# Patient Record
Sex: Female | Born: 1955
Health system: Southern US, Community
[De-identification: ages and names within clinical notes are randomized; demographics above are authoritative.]

## PROBLEM LIST (undated history)

## (undated) DIAGNOSIS — Z8619 Personal history of other infectious and parasitic diseases: Secondary | ICD-10-CM

## (undated) DIAGNOSIS — H409 Unspecified glaucoma: Secondary | ICD-10-CM

## (undated) DIAGNOSIS — K649 Unspecified hemorrhoids: Secondary | ICD-10-CM

## (undated) DIAGNOSIS — E162 Hypoglycemia, unspecified: Secondary | ICD-10-CM

## (undated) DIAGNOSIS — E079 Disorder of thyroid, unspecified: Secondary | ICD-10-CM

## (undated) DIAGNOSIS — T7840XA Allergy, unspecified, initial encounter: Secondary | ICD-10-CM

## (undated) DIAGNOSIS — F1910 Other psychoactive substance abuse, uncomplicated: Secondary | ICD-10-CM

## (undated) DIAGNOSIS — R011 Cardiac murmur, unspecified: Secondary | ICD-10-CM

## (undated) DIAGNOSIS — R296 Repeated falls: Secondary | ICD-10-CM

## (undated) DIAGNOSIS — M1990 Unspecified osteoarthritis, unspecified site: Secondary | ICD-10-CM

## (undated) DIAGNOSIS — U071 COVID-19: Secondary | ICD-10-CM

## (undated) DIAGNOSIS — E785 Hyperlipidemia, unspecified: Secondary | ICD-10-CM

## (undated) HISTORY — DX: COVID-19: U07.1

## (undated) HISTORY — PX: DENTAL SURGERY: SHX609

## (undated) HISTORY — DX: Hypoglycemia, unspecified: E16.2

## (undated) HISTORY — DX: Unspecified osteoarthritis, unspecified site: M19.90

## (undated) HISTORY — PX: COLONOSCOPY: SHX174

## (undated) HISTORY — PX: BUNIONECTOMY: SHX129

## (undated) HISTORY — PX: POLYPECTOMY: SHX149

## (undated) HISTORY — DX: Repeated falls: R29.6

## (undated) HISTORY — DX: Personal history of other infectious and parasitic diseases: Z86.19

## (undated) HISTORY — DX: Allergy, unspecified, initial encounter: T78.40XA

## (undated) HISTORY — PX: ABDOMINAL HYSTERECTOMY: SHX81

## (undated) HISTORY — DX: Cardiac murmur, unspecified: R01.1

## (undated) HISTORY — DX: Unspecified hemorrhoids: K64.9

## (undated) HISTORY — DX: Hyperlipidemia, unspecified: E78.5

## (undated) HISTORY — PX: EYE SURGERY: SHX253

## (undated) HISTORY — DX: Unspecified glaucoma: H40.9

## (undated) HISTORY — PX: TUBAL LIGATION: SHX77

## (undated) HISTORY — DX: Other psychoactive substance abuse, uncomplicated: F19.10

---
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
## 1981-06-16 HISTORY — PX: BREAST CYST EXCISION: SHX579

## 1998-08-02 ENCOUNTER — Other Ambulatory Visit: Admission: RE | Admit: 1998-08-02 | Discharge: 1998-08-02 | Payer: Self-pay | Admitting: Family Medicine

## 1998-12-31 ENCOUNTER — Other Ambulatory Visit: Admission: RE | Admit: 1998-12-31 | Discharge: 1998-12-31 | Payer: Self-pay | Admitting: *Deleted

## 2000-10-02 ENCOUNTER — Other Ambulatory Visit: Admission: RE | Admit: 2000-10-02 | Discharge: 2000-10-02 | Payer: Self-pay | Admitting: Family Medicine

## 2001-10-04 ENCOUNTER — Other Ambulatory Visit: Admission: RE | Admit: 2001-10-04 | Discharge: 2001-10-04 | Payer: Self-pay | Admitting: Family Medicine

## 2002-10-18 ENCOUNTER — Other Ambulatory Visit: Admission: RE | Admit: 2002-10-18 | Discharge: 2002-10-18 | Payer: Self-pay | Admitting: Family Medicine

## 2002-11-08 ENCOUNTER — Encounter: Admission: RE | Admit: 2002-11-08 | Discharge: 2002-11-08 | Payer: Self-pay | Admitting: Family Medicine

## 2002-11-08 ENCOUNTER — Encounter: Payer: Self-pay | Admitting: Family Medicine

## 2006-07-29 ENCOUNTER — Ambulatory Visit: Payer: Self-pay | Admitting: Internal Medicine

## 2006-07-29 LAB — CONVERTED CEMR LAB
ALT: 31 units/L (ref 0–40)
Albumin: 4.1 g/dL (ref 3.5–5.2)
Alkaline Phosphatase: 55 units/L (ref 39–117)
BUN: 14 mg/dL (ref 6–23)
Basophils Relative: 0.6 % (ref 0.0–1.0)
CO2: 31 meq/L (ref 19–32)
Calcium: 6.9 mg/dL — ABNORMAL LOW (ref 8.4–10.5)
Cholesterol: 274 mg/dL (ref 0–200)
Creatinine, Ser: 0.9 mg/dL (ref 0.4–1.2)
HDL: 69.4 mg/dL (ref >39.0)
Monocytes Relative: 11.2 % — ABNORMAL HIGH (ref 3.0–11.0)
Platelets: 321 10*3/uL (ref 150–400)
RBC: 4.49 M/uL (ref 3.87–5.11)
RDW: 13.4 % (ref 11.5–14.6)
Total Bilirubin: 1.4 mg/dL — ABNORMAL HIGH (ref 0.3–1.2)
Total CHOL/HDL Ratio: 3.9
Total Protein: 8.3 g/dL (ref 6.0–8.3)
Triglycerides: 93 mg/dL (ref 0–149)
VLDL: 19 mg/dL (ref 0–40)

## 2006-08-05 ENCOUNTER — Other Ambulatory Visit: Admission: RE | Admit: 2006-08-05 | Discharge: 2006-08-05 | Payer: Self-pay | Admitting: Internal Medicine

## 2006-08-05 ENCOUNTER — Ambulatory Visit: Payer: Self-pay | Admitting: Internal Medicine

## 2006-08-05 ENCOUNTER — Encounter: Payer: Self-pay | Admitting: Internal Medicine

## 2006-08-05 LAB — HM PAP SMEAR: HM PAP: NEGATIVE

## 2006-08-06 ENCOUNTER — Ambulatory Visit: Payer: Self-pay | Admitting: Internal Medicine

## 2006-08-06 LAB — CONVERTED CEMR LAB
Calcium, Total (PTH): 6.5 mg/dL — ABNORMAL LOW (ref 8.4–10.5)
PTH: 2.5 pg/mL — ABNORMAL LOW (ref 14.0–72.0)
Total CK: 168 units/L (ref 7–177)

## 2006-08-27 ENCOUNTER — Ambulatory Visit: Payer: Self-pay | Admitting: Endocrinology

## 2006-10-13 ENCOUNTER — Ambulatory Visit: Payer: Self-pay | Admitting: Internal Medicine

## 2006-10-13 LAB — CONVERTED CEMR LAB
Cholesterol: 224 mg/dL (ref 0–200)
Direct LDL: 111.7 mg/dL
Total CHOL/HDL Ratio: 2.5

## 2006-11-17 ENCOUNTER — Ambulatory Visit: Payer: Self-pay | Admitting: Endocrinology

## 2006-12-03 ENCOUNTER — Ambulatory Visit: Payer: Self-pay | Admitting: Endocrinology

## 2006-12-03 LAB — CONVERTED CEMR LAB: Calcium, Total (PTH): 8.2 mg/dL — ABNORMAL LOW (ref 8.4–10.5)

## 2006-12-08 ENCOUNTER — Encounter: Payer: Self-pay | Admitting: Internal Medicine

## 2007-01-19 ENCOUNTER — Telehealth: Payer: Self-pay | Admitting: Internal Medicine

## 2007-01-20 ENCOUNTER — Encounter: Payer: Self-pay | Admitting: Endocrinology

## 2007-01-21 ENCOUNTER — Ambulatory Visit: Payer: Self-pay | Admitting: Endocrinology

## 2007-01-21 LAB — CONVERTED CEMR LAB
BUN: 11 mg/dL (ref 6–23)
CO2: 29 meq/L (ref 19–32)
Calcium, Total (PTH): 7.7 mg/dL — ABNORMAL LOW (ref 8.4–10.5)
Calcium: 7.9 mg/dL — ABNORMAL LOW (ref 8.4–10.5)
GFR calc Af Amer: 97 mL/min
GFR calc non Af Amer: 80 mL/min
Potassium: 3.9 meq/L (ref 3.5–5.1)
TSH: 0.31 microintl units/mL — ABNORMAL LOW (ref 0.35–5.50)

## 2007-01-25 ENCOUNTER — Ambulatory Visit: Payer: Self-pay

## 2007-02-22 ENCOUNTER — Encounter: Payer: Self-pay | Admitting: Endocrinology

## 2007-02-23 ENCOUNTER — Ambulatory Visit: Payer: Self-pay | Admitting: Endocrinology

## 2007-02-23 LAB — CONVERTED CEMR LAB
Sed Rate: 14 mm/h
TSH: 1.89 u[IU]/mL

## 2007-02-26 ENCOUNTER — Encounter: Payer: Self-pay | Admitting: Endocrinology

## 2008-01-11 ENCOUNTER — Telehealth: Payer: Self-pay | Admitting: Internal Medicine

## 2008-02-09 ENCOUNTER — Telehealth (INDEPENDENT_AMBULATORY_CARE_PROVIDER_SITE_OTHER): Payer: Self-pay | Admitting: *Deleted

## 2008-02-17 ENCOUNTER — Encounter: Payer: Self-pay | Admitting: Internal Medicine

## 2008-02-23 ENCOUNTER — Telehealth: Payer: Self-pay | Admitting: Internal Medicine

## 2008-03-17 ENCOUNTER — Encounter: Payer: Self-pay | Admitting: Endocrinology

## 2008-03-17 ENCOUNTER — Telehealth: Payer: Self-pay | Admitting: Internal Medicine

## 2008-03-17 ENCOUNTER — Telehealth: Payer: Self-pay | Admitting: *Deleted

## 2008-03-21 ENCOUNTER — Telehealth (INDEPENDENT_AMBULATORY_CARE_PROVIDER_SITE_OTHER): Payer: Self-pay | Admitting: *Deleted

## 2008-09-06 LAB — HM MAMMOGRAPHY

## 2009-07-09 ENCOUNTER — Encounter: Payer: Self-pay | Admitting: Cardiovascular Disease

## 2010-02-07 ENCOUNTER — Encounter: Payer: Self-pay | Admitting: Cardiology

## 2010-03-14 ENCOUNTER — Ambulatory Visit: Payer: Self-pay | Admitting: Endocrinology

## 2010-03-14 DIAGNOSIS — E209 Hypoparathyroidism, unspecified: Secondary | ICD-10-CM | POA: Insufficient documentation

## 2010-05-03 ENCOUNTER — Encounter: Payer: Self-pay | Admitting: Cardiology

## 2010-05-14 DIAGNOSIS — E785 Hyperlipidemia, unspecified: Secondary | ICD-10-CM | POA: Insufficient documentation

## 2010-05-14 DIAGNOSIS — E049 Nontoxic goiter, unspecified: Secondary | ICD-10-CM | POA: Insufficient documentation

## 2010-05-15 ENCOUNTER — Encounter: Payer: Self-pay | Admitting: Cardiology

## 2010-05-15 ENCOUNTER — Ambulatory Visit: Payer: Self-pay | Admitting: Cardiology

## 2010-05-15 DIAGNOSIS — R072 Precordial pain: Secondary | ICD-10-CM | POA: Insufficient documentation

## 2010-05-15 DIAGNOSIS — F17200 Nicotine dependence, unspecified, uncomplicated: Secondary | ICD-10-CM | POA: Insufficient documentation

## 2010-05-20 ENCOUNTER — Telehealth (INDEPENDENT_AMBULATORY_CARE_PROVIDER_SITE_OTHER): Payer: Self-pay | Admitting: *Deleted

## 2010-05-21 ENCOUNTER — Ambulatory Visit: Payer: Self-pay

## 2010-05-21 ENCOUNTER — Ambulatory Visit (HOSPITAL_COMMUNITY)
Admission: RE | Admit: 2010-05-21 | Discharge: 2010-05-21 | Disposition: A | Discharge status: Still In Hospital | Payer: Self-pay | Source: Home / Self Care | Attending: Cardiology | Admitting: Cardiology

## 2010-05-23 ENCOUNTER — Emergency Department (HOSPITAL_COMMUNITY): Admission: EM | Admit: 2010-05-23 | Discharge: 2009-07-09 | Payer: Self-pay | Admitting: Emergency Medicine

## 2010-07-18 NOTE — Letter (Signed)
Summary: Progress Note  Progress Note   Imported By: Roderic Ovens 06/03/2010 10:56:01  _____________________________________________________________________  External Attachment:    Type:   Image     Comment:   External Document

## 2010-07-18 NOTE — Assessment & Plan Note (Signed)
Summary: NP6/ DX: ATYPICAL CHESTPAIN/.INSU: [employer]. GD  Medications Added * CALCIUM 1 tab by mouth once daily MULTIVITAMINS   TABS (MULTIPLE VITAMIN) 1 tab bpo once daily        Primary Provider:  panosh  CC:  sob...pt states she experinced extreame arm numbness.  History of Present Illness: 55 yo female with no prior cardiac history who I am asked to evaluate for chest pain. Several weeks ago the patient had left shoulder pain. She states my shoulder "froze". It was difficult to move the arm. She had an episode in class of nausea, diaphoresis and dizziness. She also states that when she exerts herself to an extreme amount she will develop a heaviness in her chest. It resolved with rest. She has had this since an early age. It is unchanged. She has some dyspnea on exertion that she attributes to smoking. There is no orthopnea, PND, pedal edema or palpitations. Because of the above we were asked to further evaluate.  Current Medications (verified): 1)  Levothyroxine Sodium 75 Mcg  Tabs (Levothyroxine Sodium) .... Take 1 By Mouth Qd 2)  Calcitriol 0.5 Mcg  Caps (Calcitriol) .... Take 1 By Mouth Qd 3)  Calcium .Marland Kitchen.. 1 Tab By Mouth Once Daily 4)  Multivitamins   Tabs (Multiple Vitamin) .Marland Kitchen.. 1 Tab Bpo Once Daily  Allergies: 1)  ! Penicillin  Past History:  Past Medical History: Reviewed history from 05/14/2010 and no changes required. HYPERLIPIDEMIA  GOITER HYPOCALCEMIA  HYPOTHYROIDISM  Hypo-parathyroidism (2008) Menopause  Past Surgical History: Reviewed history from 05/14/2010 and no changes required. Thyroidectomy (1970's)  In 1987, a lumpectomy for benign breast problems.  In 1996, partial hysterectomy for possible fibroids.   January 2007, a bunion in the foot and apparently needed plates in her right foot.   November 2007, dental surgery with implants.  Family History: Mother died in her 76s with some type of liver hardening.  Was not a drinker.   Question if it could have been biliary cirrhosis but no diagnosis was ever made.  There are no other family members with liver disease.  Elevated cholesterol in her mom and her. Type 2 diabetes in both sides of the family.  Unknown if thyroid disease has been an issue. No premature CAD  Social History: Reviewed history from 05/14/2010 and no changes required. Household of one.  Works as a Building control surveyor. Divorced.  Five to seven hours of sleep.  Tobacco for 20 years.  Social alcohol, not a lot.  Exercise occasional but not much.  See database.  Review of Systems       Problems with fatigue but no fevers or chills, productive cough, hemoptysis, dysphasia, odynophagia, melena, hematochezia, dysuria, hematuria, rash, seizure activity, orthopnea, PND, pedal edema, claudication. Remaining systems are negative.   Vital Signs:  Patient profile:   55 year old female Height:      68 inches Weight:      157 pounds BMI:     23.96 Pulse rate:   77 / minute Resp:     14 per minute BP sitting:   111 / 71  (left arm)  Vitals Entered By: Kem Parkinson (May 15, 2010 2:57 PM)  Physical Exam  General:  Well developed/well nourished in NAD Skin warm/dry Patient not depressed No peripheral clubbing Back-normal HEENT-normal/normal eyelids Neck supple/normal carotid upstroke bilaterally; no bruits; no JVD; no thyromegaly chest - CTA/ normal expansion CV - RRR/normal S1 and S2; no  rubs or gallops;  PMI nondisplaced;  1/6 systolic ejection murmur. Abdomen -NT/ND, no HSM, no mass, + bowel sounds, no bruit 2+ femoral pulses, no bruits Ext-no edema, chords, 2+ DP Neuro-grossly nonfocal     EKG  Procedure date:  05/15/2010  Findings:      Sinus rhythm, left anterior fascicular block, cannot rule out prior septal infarct, nonspecific ST changes.  Impression & Recommendations:  Problem # 1:  CHEST PAIN, PRECORDIAL (ICD-786.51) Atypical chest pain. Schedule stress  echocardiogram. Orders: Stress Echo (Stress Echo)  Problem # 2:  HYPERLIPIDEMIA (ICD-272.4) Continue statin. Lipids and liver monitored by primary care. The following medications were removed from the medication list:    Simvastatin 20 Mg Tabs (Simvastatin) .Marland Kitchen... 1 by mouth once daily  Problem # 3:  HYPOTHYROIDISM (ICD-244.9)  Her updated medication list for this problem includes:    Levothyroxine Sodium 75 Mcg Tabs (Levothyroxine sodium) .Marland Kitchen... Take 1 by mouth qd  Problem # 4:  TOBACCO ABUSE (ICD-305.1) Patient counseled on discontinuing for between 3-10 minutes.  Patient Instructions: 1)  Your physician recommends that you schedule a follow-up appointment in: AS NEEDED PENDING TEST RESULTS 2)  Your physician has requested that you have a stress echocardiogram. For further information please visit [email or web address]  Please follow instruction sheet as given.

## 2010-07-18 NOTE — Letter (Signed)
Summary: [facility] Physician Documentation Sheet  [facility] Physician Documentation Sheet   Imported By: Earl Many 05/14/2010 16:01:07  _____________________________________________________________________  External Attachment:    Type:   Image     Comment:   External Document

## 2010-07-18 NOTE — Letter (Signed)
Summary: Release of info/[employer]  Release of info/[employer]   Imported By: Sherian Rein 03/18/2010 14:15:08  _____________________________________________________________________  External Attachment:    Type:   Image     Comment:   External Document

## 2010-07-18 NOTE — Progress Notes (Signed)
Summary: Stress Echo pre procedure  Phone Note Outgoing Call Call back at Home Phone [phone]   Call placed by: Rea College, CMA,  May 20, 2010 1:32 PM Call placed to: Patient Summary of Call: Left message with instructions for Stress Echo on 05/21/10 @ 3:00 p.m.

## 2010-07-18 NOTE — Miscellaneous (Signed)
   Clinical Lists Changes  Problems: Added new problem of HYPOPARATHYROIDISM (ICD-252.1)

## 2010-11-01 NOTE — Assessment & Plan Note (Signed)
[facility]                            [facility] OFFICE NOTE   Tricia Woodward, Tricia Woodward                       MRN:          [id number]  DATE:08/05/2006                            DOB:          1956/06/11    CHIEF COMPLAINT:  New patient physical, wants Pap, discuss Wellbutrin,  lipids, multiple issues.   HISTORY OF PRESENT ILLNESS:  Ms. Chismar is a 55 year old occasional  closet-smoker divorced African-American female who comes today for a  first-time visit.  Her previous primary care was Dr. Pecola Leisure and she sees  no specialists recently.  She states that she is due for a Pap smear and  did have laboratory studies done before her visit today, needs those to  review.  She needs her medicines refilled which include levothyroxine  for which she is on thyroid replacement after apparently thyroidectomy  for hyperthyroidism.  However, she has not taken them in the last week  before she had her labs done.  She is also on Estratest for hormone  replacement ever since she had her partial hysterectomy for what sounded  like fibroids.  However, she has been off of this a month and has had  just an occasional hot flush and wonders if she should stay on it or go  back on it.  She had been on Lipitor for hyperlipidemia, stopped it 2-3  weeks before her labs were done also, wonders if it was causing some  moderate muscle aches as when she went off of it, she did not feel quite  as badly.  She also has been on calcium supplements but has never been  told she had a low calcium level or been evaluated for such.  She is a  smoker a number of years and has quit a million times, is interested in  possibly Wellbutrin because a friend of hers had success with that in  smoking cessation.  She is not aware of Chantix.   PAST MEDICAL HISTORY:  See database.   Surgeries:  1. In 1973, a thyroidectomy.  2. In 1987, a lumpectomy for benign breast problems.  3. In 1996, partial  hysterectomy for possible fibroids.  4. January 2007, a bunion in the foot and apparently needed plates in      her right foot.  5. November 2007, dental surgery with implants.   She is para 2.  Last Pap 2007.  LMP 1996.  She has had a tetanus shot  and mammogram a year or two ago.  She was told she had a history of a  heart murmur as a child, elevated cholesterol as above.  No diabetes but  low blood sugar and she did have chicken pox as a child.   FAMILY HISTORY:  Mother died in her 42s with some type of liver  hardening.  Was not a drinker.  Question if it could have been biliary  cirrhosis but no diagnosis was ever made.  There are no other family  members with liver disease.  Elevated cholesterol in her mom and her.  Type 2 diabetes in both sides of the  family.  Unknown if thyroid disease  has been an issue.   SOCIAL HISTORY:  Household of one.  Works as a Building control surveyor.  Divorced.  Five to seven hours of sleep.  Tobacco as above.  Social  alcohol, not a lot.  Exercise occasional but not much.  See database.   REVIEW OF SYSTEMS:  Negative for chest pain, shortness of breath, acute  musculoskeletal problems, question myalgias secondary to Lipitor,  occasional hot flushes.  GI/GU otherwise negative.  See database.   MEDICATIONS:  1. Estratest 1.25/2.5 but off for a month.  2. Levothyroxine 100 mcg but off for a week.  3. Calcium supplements when remembers.  4. Lipitor has been stopped.   DRUG ALLERGIES:  None.   OBJECTIVE:  VITAL SIGNS:  Height 5 feet 8 inches, weight 165, pulse 66  and regular, blood pressure 100/70.  GENERAL:  This is WD/WN, healthy-appearing middle-aged lady in no acute  distress who appears her stated age or younger.  HEENT:  Normocephalic, TMs clear.  Eyes:  PRRL, EOMs full.  OP clear.  No fasciculations noted.  NECK:  Shows well-healed thyroidectomy scar with no obvious masses and  no adenopathy.  CHEST:  CTA, BS equal.  CARDIAC:  S1, S2.  No  gallops or murmurs.  Peripheral pulses present  without delay.  Negative CCE.  BREASTS:  No nodules or discharge.  Axilla is clear.  ABDOMEN:  Soft without organomegaly, guarding or rebound.  PELVIC:  External genitourinary is normal, cervix absent.  Pink vaginal  mucosa.  Bimanual negative masses.  Rectovaginal confirmed.  NEUROLOGIC:  Grossly intact and there is no tetany or obvious muscle  spasms or skin changes.   EKG was deferred as the patient is in a rush today to go to another  appointment.  Laboratory that was done on February 13 shows a normal CBC  except neutrophils 36, lymphocytes 46.  BMET is normal except for a  calcium of 6.9.  LFTs normal.  TSH 2.99.  Albumin 4.1.  Cholesterol is  274, triglycerides 93, HDL 69, LDL is 180.4.  Urinalysis done fasting  was +1 ketones, +1 protein, trace blood.   IMPRESSION:  1. Wellness examination, new patient.  Appears to be up-to-date on      immunizations.  Pap done today.  Healthy lifestyle briefly      discussed.  Tobacco cessation briefly discussed and handout on      Chantix given.  2. Hyperlipidemia, question side effects of Lipitor.  3. Hypothyroidism, on replacement; some hyperthyroidism.  4. Hypocalcemia on screening laboratory work, cannot correlate this      with her history and physical, and we need to follow that up.  5. Menopausal symptoms.  These sound like some mild hot flushes off      her estrogen and I do not see a reason to remain on it if her      symptoms are not that problematic.  We did discuss going back on      the Estratest a couple of times a week or switching the preparation      altogether if needed.  At present she will just watch for her      symptoms.   PLAN:  She will get a PTH and an ionized calcium as soon as convenient  for her this week.  If this is okay we will call her and tell her we can  call in some Chantix and restart a cholesterol medication as  appropriate, and then try and follow up  after that.   [facility]     Neta Mends. Fabian Sharp, MD  Electronically Signed    WKP/MedQ  DD: 08/05/2006  DT: 08/05/2006  Job #: [id number]

## 2010-11-01 NOTE — Consult Note (Signed)
[facility]                          ENDOCRINOLOGY KEIRSTAN, IANNELLO                       MRN:          [id number]  DATE:08/27/2006                            DOB:          1955/07/31    REFERRING PHYSICIAN:  Neta Mends. Panosh, MD   REASON FOR REFERRAL:  Hypocalcemia.   HISTORY OF THE PRESENT ILLNESS:  A 55 year old woman who had a  thyroidectomy in the 1970s for suspicious nodules, which later proved to  be benign on pathology.  This was done in [place].  She has a many-  year history of hypocalcemia, and she has required high-dose calcium by  mouth.  She has many years of severe cramps of the lower extremities,  with some associated numbness of the tongue.   PAST MEDICAL HISTORY:  1. Menopause.  2. Postoperative hypothyroidism.  3. Dyslipidemia.   SOCIAL HISTORY:  She is single.  She works as a Building control surveyor.   FAMILY HISTORY:  Negative for thyroid or parathyroid disease.   REVIEW OF SYSTEMS:  She denies memory loss.  She denies any change in  her weight.   PHYSICAL EXAMINATION:  VITAL SIGNS:  Blood pressure 99/62, heart rate  85, temperature is 98.4, the weight is 165.  GENERAL:  No distress.  SKIN:  Normal texture and temperature.  No rash.  HEENT:  No proptosis.  No periorbital swelling.  NECK:  No goiter.  There is a healed surgical scar.  EXTREMITIES:  No edema.  On all four extremities, there is no deformity,  and she has full range of motion without any pain in the extremities.  NEUROLOGIC:  Alert, oriented.  Does not appear anxious or depressed.  Sensation is intact to touch on all fours.   LABORATORY STUDIES FORWARDED BY DR. PANOSH:  On August 06, 2006,  calcium 6.5, parathyroid hormone 2.5.  DEXA study shows osteoporosis.  TSH 2.99.   IMPRESSION:  1. History of a benign goiter, for which she had surgery in the 1970s.  2. Well-controlled postoperative hypothyroidism.  3. Hypoparathyroidism, which is  probably due to her surgery.  4. Hypocalcemia due to #3.  5. Symptoms of numbness and muscle cramps, probably due to her      hypocalcemia.   PLAN:  1. Rocaltrol 0.25 mcg a day.  2. Calcium supplement 500 mg twice a day.  3. Return in 30 days.  4. Same Synthroid.     Sean A. Everardo All, MD  Electronically Signed    SAE/MedQ  DD: 08/30/2006  DT: 08/31/2006  Job #: [id number]   cc:   Neta Mends. Fabian Sharp, MD

## 2010-12-04 ENCOUNTER — Other Ambulatory Visit: Payer: Self-pay | Admitting: Internal Medicine

## 2010-12-04 ENCOUNTER — Ambulatory Visit
Admission: RE | Admit: 2010-12-04 | Discharge: 2010-12-04 | Disposition: A | Discharge status: Home / Self Care | Payer: PRIVATE HEALTH INSURANCE | Source: Ambulatory Visit | Attending: Internal Medicine | Admitting: Internal Medicine

## 2010-12-04 DIAGNOSIS — M5430 Sciatica, unspecified side: Secondary | ICD-10-CM

## 2011-10-02 ENCOUNTER — Ambulatory Visit: Payer: PRIVATE HEALTH INSURANCE | Attending: Otolaryngology | Admitting: Audiology

## 2011-10-02 DIAGNOSIS — Z0389 Encounter for observation for other suspected diseases and conditions ruled out: Secondary | ICD-10-CM | POA: Insufficient documentation

## 2011-10-02 DIAGNOSIS — Z0110 Encounter for examination of ears and hearing without abnormal findings: Secondary | ICD-10-CM | POA: Insufficient documentation

## 2012-09-26 ENCOUNTER — Encounter (HOSPITAL_COMMUNITY): Payer: Self-pay | Admitting: *Deleted

## 2012-09-26 ENCOUNTER — Emergency Department (INDEPENDENT_AMBULATORY_CARE_PROVIDER_SITE_OTHER): Payer: PRIVATE HEALTH INSURANCE

## 2012-09-26 ENCOUNTER — Emergency Department (INDEPENDENT_AMBULATORY_CARE_PROVIDER_SITE_OTHER)
Admission: EM | Admit: 2012-09-26 | Discharge: 2012-09-26 | Disposition: A | Discharge status: Home / Self Care | Payer: PRIVATE HEALTH INSURANCE | Source: Home / Self Care | Attending: Family Medicine | Admitting: Family Medicine

## 2012-09-26 DIAGNOSIS — M25562 Pain in left knee: Secondary | ICD-10-CM

## 2012-09-26 DIAGNOSIS — M25569 Pain in unspecified knee: Secondary | ICD-10-CM

## 2012-09-26 HISTORY — DX: Disorder of thyroid, unspecified: E07.9

## 2012-09-26 MED ORDER — HYDROCODONE-ACETAMINOPHEN 5-325 MG PO TABS
ORAL_TABLET | ORAL | Status: AC
  Filled 2012-09-26: qty 1

## 2012-09-26 MED ORDER — HYDROCODONE-ACETAMINOPHEN 5-325 MG PO TABS
1.0000 | ORAL_TABLET | Freq: Once | ORAL | Status: AC
  Administered 2012-09-26: 1 via ORAL

## 2012-09-26 MED ORDER — HYDROCODONE-ACETAMINOPHEN 5-325 MG PO TABS: 2.0000 | ORAL_TABLET | Freq: Three times a day (TID) | ORAL | Status: DC | PRN

## 2012-09-26 MED ORDER — NAPROXEN 500 MG PO TABS: 500.0000 mg | ORAL_TABLET | Freq: Two times a day (BID) | ORAL | Status: DC

## 2012-09-26 NOTE — ED Notes (Signed)
Patient complains of left knee pain and and swelling x 7 days. Can not put weight on left leg. Patient wants to know if steroid injection would help.

## 2012-09-29 NOTE — ED Provider Notes (Signed)
History     CSN: [id number]  Arrival date & time 09/26/12  1159   First MD Initiated Contact with Patient 09/26/12 1219      Chief Complaint  Patient presents with  . Knee Pain    (Consider location/radiation/quality/duration/timing/severity/associated sxs/prior treatment) HPI Comments: 57 y/o smoker female with h/o hypothyroidism, OA and osteoporosis. Here c/o left knee pain in lateral area for 1 week. Reports radiation to lateral thigh and lateral lower leg. Patient states pain has been progressively worse and has not been able to put weight in last few days due to pain. Patient reports she fell and landed over her left knee 2 weeks ago but she felt OK after the fall and was weight bearing. She had an abrasion over her left knee cap that is now healed. Patient thinks her knee might have some swelling but denies redness or increased temperature. No similar symptoms in the past on the left knee. Denies h/o gout. No fever or chills. No taking any medication for her pain. "Will like to know if a steroid injection can help". Has not had her knee injected before.   Past Medical History  Diagnosis Date  . Thyroid disease     Past Surgical History  Procedure Laterality Date  . Abdominal hysterectomy      No family history on file.  History  Substance Use Topics  . Smoking status: Heavy Tobacco Smoker -- 0.50 packs/day    Types: Cigarettes  . Smokeless tobacco: Not on file  . Alcohol Use: Yes    OB History   Grav Para Term Preterm Abortions TAB SAB Ect Mult Living                  Review of Systems  Constitutional: Negative for fever, chills and fatigue.  Musculoskeletal: Positive for arthralgias.  Skin: Negative for color change and rash.    Allergies  Penicillins  Home Medications   Current Outpatient Rx  Name  Route  Sig  Dispense  Refill  . calcitRIOL (ROCALTROL) 0.25 MCG capsule   Oral   Take 0.75 mcg by mouth daily.         Marland Kitchen levothyroxine (SYNTHROID,  LEVOTHROID) 100 MCG tablet   Oral   Take 75 mcg by mouth daily before breakfast.          . rosuvastatin (CRESTOR) 10 MG tablet   Oral   Take 10 mg by mouth daily.         Marland Kitchen HYDROcodone-acetaminophen (NORCO/VICODIN) 5-325 MG per tablet   Oral   Take 2 tablets by mouth every 8 (eight) hours as needed for pain.   10 tablet   0   . naproxen (NAPROSYN) 500 MG tablet   Oral   Take 1 tablet (500 mg total) by mouth 2 (two) times daily with a meal.   20 tablet   0     BP 90/70  Pulse 67  Temp(Src) 99.3 F (37.4 C) (Oral)  Resp 18  SpO2 100%  Physical Exam  Nursing note and vitals reviewed. Constitutional: She is oriented to person, place, and time. She appears well-developed and well-nourished. No distress.  HENT:  Head: Normocephalic and atraumatic.  Cardiovascular: Normal heart sounds.   Pulmonary/Chest: Breath sounds normal.  Musculoskeletal:  Left knee: No abvious swelling or deformity. No redness or increased temperature. No palpable effusion. Reported pain with palpation superior and lateral to the knee over lateral distal femur. No knee hyper laxity on stress valgo or varus. Limited  flexion and extension due to reported pain and also patient reported pain with active and passive movement. Negative drawer test. Do not feel Baker's cyst. Left lower extremity with normal superficial sensation. Also concerved dorsal pedal and tibial posterior pulses. Patient reports pain with walking and has a limp on the left side.  Neurological: She is alert and oriented to person, place, and time.  Skin: No rash noted. She is not diaphoretic.    ED Course  Procedures (including critical care time)  Labs Reviewed - No data to display No results found.   1. Lateral knee pain, left       MDM  No acute Fx. There is a non specific subtle density that appears chronic in the area were patient presents focal tenderness on Xrays. Possible chronic inflammatory process with  calcification? or bone avulsion? in area of ligament insertion to distal femur?.  Placed on crutches. Prescribed naproxen and Norco. Ortho referral provided.         Sharin Grave, MD 09/29/12 1028

## 2012-12-15 ENCOUNTER — Ambulatory Visit (INDEPENDENT_AMBULATORY_CARE_PROVIDER_SITE_OTHER): Payer: PRIVATE HEALTH INSURANCE | Admitting: Internal Medicine

## 2012-12-15 ENCOUNTER — Encounter: Payer: Self-pay | Admitting: Internal Medicine

## 2012-12-15 VITALS — BP 90/61 | HR 89 | Temp 99.4°F | Resp 16 | Ht 65.0 in | Wt 152.0 lb

## 2012-12-15 DIAGNOSIS — W19XXXA Unspecified fall, initial encounter: Secondary | ICD-10-CM

## 2012-12-15 DIAGNOSIS — R279 Unspecified lack of coordination: Secondary | ICD-10-CM

## 2012-12-15 DIAGNOSIS — R270 Ataxia, unspecified: Secondary | ICD-10-CM

## 2012-12-15 DIAGNOSIS — E039 Hypothyroidism, unspecified: Secondary | ICD-10-CM

## 2012-12-15 NOTE — Progress Notes (Signed)
Subjective:    Patient ID: Tricia Woodward, female    DOB: 1955-07-11, 57 y.o.   MRN: [id number]  HPI: Tricia Woodward is a patient of Dr. Della Goo who is here today for an acute visit secondary to recurrent falls. The patient's daughter is in the room with her and to be clear it appears that her visit today is at the insistence of her daughter for second opinion. The patient reports that she's had 3 falls in the last 6 months none of which have been reported to her personal physician. The falls have not been accompanied by any dizziness, lightheadedness, disequilibrium, palpitations or shortness of breath. The patient states that she's had a long-standing problem with peripheral vision and what she describes as possible depth perception. However the etiology of her depth perception is unknown. Her last visit examination was August of 2013.  The patient does have hearing loss and wears hearing aids however she describes no central vestibular symptoms. The patient does admit to alcohol use approximately 1 pint of alcohol per week. And she smokes approximately 15 cigarettes per day.  She also admits to anorexia but no nausea vomiting or diarrhea.    Review of Systems  Constitutional: Positive for unexpected weight change (Patient has lost approximately 12 pounds in the last several months which she states is normal for her during this time of year).  HENT: Negative.   Eyes: Negative.   Respiratory: Negative.   Cardiovascular: Negative.   Gastrointestinal: Negative.   Endocrine: Negative.   Genitourinary: Negative.   Musculoskeletal: Negative.   Skin: Negative.   Allergic/Immunologic: Negative.   Neurological: Negative.   Hematological: Negative.   Psychiatric/Behavioral: Negative.        Objective:   Physical Exam  Constitutional: She appears well-developed and well-nourished.  HENT:  Head: Normocephalic and atraumatic.  Right Ear: External ear normal.  Left Ear: External ear  normal.  Eyes: EOM are normal. Pupils are equal, round, and reactive to light.  Neck: Normal range of motion. Neck supple.  Cardiovascular: Normal rate and regular rhythm.   Neurological: Coordination abnormal.  Patient has impaired tandem walking and deficits in proprioception. Ataxic gait on evaluation  Skin: Skin is warm and dry.  Psychiatric: She has a normal mood and affect. Her behavior is normal. Judgment and thought content normal.          Assessment & Plan:  1. patient presented with recurrent falls. It appears from my physical examination and history the patient may have some peripheral vestibular problems. This can certainly have some relationship to her alcohol intake which may have caused some posterior column abnormality. I will check B12 and folate levels and her electrolytes. In addition I think the patient would benefit from outpatient physical therapy for balance and vestibular training. Ultimately the patient may require MRI of the brain however I will await to obtain the laboratory studies and refer to the patient to physical therapy before proceeding with an MRI given the fact that the patient does not have any focal neurological deficits that would indicate an intracerebral process. In light of her ataxic gait will check a B12, Folate and RPR.The patient is supposed to be on B12 however I doubt that she is compliant with her medications based on conversation. Additionally the patient was ordered laboratory studies by her primary care physician in January of this year and did not comply with follow through with them. In light of this I will check the patient's TSH to also  ascertain the condition of her thyroid gland. Mr. Kath also complains of generalized fatigue and poor appetite I will check a CBC with differential for any elements that might lead to a cause of her generalized fatigue and poor appetite.  In light of the fact that the patient's does have a primary care  physician Dr. Della Goo I have obtained permission from the patient to discuss in entirety my evaluation of this patient.  Plan: The patient will have laboratory studies drawn tomorrow morning. Additionally referral to Physical therapy outpatient physical therapy will be made.

## 2012-12-16 ENCOUNTER — Other Ambulatory Visit: Payer: PRIVATE HEALTH INSURANCE | Admitting: *Deleted

## 2012-12-16 ENCOUNTER — Other Ambulatory Visit: Payer: Self-pay | Admitting: Internal Medicine

## 2012-12-16 DIAGNOSIS — R270 Ataxia, unspecified: Secondary | ICD-10-CM

## 2012-12-16 DIAGNOSIS — W19XXXA Unspecified fall, initial encounter: Secondary | ICD-10-CM

## 2012-12-16 LAB — CBC WITH DIFFERENTIAL/PLATELET
Basophils Absolute: 0 10*3/uL (ref 0.0–0.1)
HCT: 35.3 % — ABNORMAL LOW (ref 36.0–46.0)
Lymphocytes Relative: 51 % — ABNORMAL HIGH (ref 12–46)
Monocytes Absolute: 0.8 10*3/uL (ref 0.1–1.0)
Neutro Abs: 1.7 10*3/uL (ref 1.7–7.7)
Platelets: 295 10*3/uL (ref 150–400)
RDW: 14.9 % (ref 11.5–15.5)
WBC: 5.6 10*3/uL (ref 4.0–10.5)

## 2012-12-16 LAB — TSH: TSH: 3.096 u[IU]/mL (ref 0.350–4.500)

## 2012-12-16 LAB — COMPREHENSIVE METABOLIC PANEL
ALT: 46 U/L — ABNORMAL HIGH (ref 0–35)
AST: 43 U/L — ABNORMAL HIGH (ref 0–37)
Albumin: 4.3 g/dL (ref 3.5–5.2)
Calcium: 8.5 mg/dL (ref 8.4–10.5)
Chloride: 103 mEq/L (ref 96–112)
Potassium: 4.8 mEq/L (ref 3.5–5.3)
Sodium: 137 mEq/L (ref 135–145)

## 2012-12-16 LAB — RPR

## 2012-12-16 NOTE — Progress Notes (Signed)
A referral for Physical Therapy to [facility] was faxed 12/16/2012 for pt.

## 2012-12-28 ENCOUNTER — Ambulatory Visit: Payer: PRIVATE HEALTH INSURANCE | Attending: Internal Medicine

## 2012-12-28 DIAGNOSIS — Z9181 History of falling: Secondary | ICD-10-CM | POA: Insufficient documentation

## 2012-12-28 DIAGNOSIS — R279 Unspecified lack of coordination: Secondary | ICD-10-CM | POA: Insufficient documentation

## 2012-12-28 DIAGNOSIS — IMO0001 Reserved for inherently not codable concepts without codable children: Secondary | ICD-10-CM | POA: Insufficient documentation

## 2012-12-29 ENCOUNTER — Ambulatory Visit: Payer: PRIVATE HEALTH INSURANCE | Admitting: Physical Therapy

## 2012-12-31 ENCOUNTER — Ambulatory Visit: Payer: PRIVATE HEALTH INSURANCE | Admitting: Physical Therapy

## 2013-01-04 ENCOUNTER — Ambulatory Visit: Payer: PRIVATE HEALTH INSURANCE

## 2013-01-06 ENCOUNTER — Ambulatory Visit: Payer: PRIVATE HEALTH INSURANCE

## 2013-01-10 ENCOUNTER — Other Ambulatory Visit: Payer: Self-pay | Admitting: Internal Medicine

## 2013-01-10 DIAGNOSIS — R269 Unspecified abnormalities of gait and mobility: Secondary | ICD-10-CM

## 2013-01-10 DIAGNOSIS — R296 Repeated falls: Secondary | ICD-10-CM

## 2013-01-12 ENCOUNTER — Other Ambulatory Visit: Payer: Self-pay | Admitting: Internal Medicine

## 2013-01-12 ENCOUNTER — Encounter: Payer: PRIVATE HEALTH INSURANCE | Admitting: Rehabilitative and Restorative Service Providers"

## 2013-01-12 ENCOUNTER — Encounter: Payer: Self-pay | Admitting: Internal Medicine

## 2013-01-12 ENCOUNTER — Other Ambulatory Visit: Payer: PRIVATE HEALTH INSURANCE | Admitting: *Deleted

## 2013-01-12 ENCOUNTER — Ambulatory Visit: Payer: PRIVATE HEALTH INSURANCE | Admitting: Physical Therapy

## 2013-01-13 ENCOUNTER — Telehealth: Payer: Self-pay | Admitting: Primary Care

## 2013-01-13 ENCOUNTER — Encounter: Payer: PRIVATE HEALTH INSURANCE | Admitting: Rehabilitative and Restorative Service Providers"

## 2013-01-14 ENCOUNTER — Ambulatory Visit: Payer: PRIVATE HEALTH INSURANCE | Attending: Internal Medicine | Admitting: Physical Therapy

## 2013-01-14 DIAGNOSIS — IMO0001 Reserved for inherently not codable concepts without codable children: Secondary | ICD-10-CM | POA: Insufficient documentation

## 2013-01-14 DIAGNOSIS — R279 Unspecified lack of coordination: Secondary | ICD-10-CM | POA: Insufficient documentation

## 2013-01-14 DIAGNOSIS — Z9181 History of falling: Secondary | ICD-10-CM | POA: Insufficient documentation

## 2013-01-17 ENCOUNTER — Ambulatory Visit: Payer: PRIVATE HEALTH INSURANCE

## 2013-01-17 ENCOUNTER — Encounter: Payer: Self-pay | Admitting: Internal Medicine

## 2013-01-17 ENCOUNTER — Ambulatory Visit (INDEPENDENT_AMBULATORY_CARE_PROVIDER_SITE_OTHER): Payer: PRIVATE HEALTH INSURANCE | Admitting: Internal Medicine

## 2013-01-17 VITALS — BP 115/68 | HR 79 | Temp 98.5°F | Wt 149.8 lb

## 2013-01-17 DIAGNOSIS — F17200 Nicotine dependence, unspecified, uncomplicated: Secondary | ICD-10-CM

## 2013-01-17 DIAGNOSIS — R296 Repeated falls: Secondary | ICD-10-CM

## 2013-01-17 DIAGNOSIS — Z9181 History of falling: Secondary | ICD-10-CM

## 2013-01-17 NOTE — Progress Notes (Signed)
  Subjective:    Patient ID: Tricia Woodward, female    DOB: 1955-10-05, 57 y.o.   MRN: [id number]  HPI: Pt here for follow-up regarding recurrent falls. I Have reviewed patient's  labs and her progress in PT. She has not had any recent falls and she feels that she has been improving with PT. She states that she was without her Synthroid for approximately 2 weeks and was feeling poorly. However she is feeling better since resuming her Synthroid.  Her BP is higher than it usually is today and she states that she feels more energetic. She has a history of hypoglycemia and Hypotension.     Review of Systems  Constitutional: Negative.   HENT: Negative.   Eyes: Negative.   Respiratory: Negative.   Cardiovascular: Negative.   Gastrointestinal: Negative.   Endocrine: Negative.   Genitourinary: Negative.   Skin: Negative.   Allergic/Immunologic: Negative.   Neurological: Negative.   Hematological: Negative.   Psychiatric/Behavioral: Negative.        Objective:   Physical Exam  Constitutional: She is oriented to person, place, and time. She appears well-developed and well-nourished.  HENT:  Head: Normocephalic and atraumatic.  Eyes: Conjunctivae and EOM are normal. Pupils are equal, round, and reactive to light. No scleral icterus.  Neck: Normal range of motion. Neck supple. No JVD present. No thyromegaly present.  Cardiovascular: Normal rate and regular rhythm.  Exam reveals no gallop and no friction rub.   No murmur heard. Pulmonary/Chest: Effort normal and breath sounds normal. She has no wheezes. She has no rales.  Abdominal: Soft. Bowel sounds are normal. She exhibits no distension and no mass. There is no tenderness.  Musculoskeletal: Normal range of motion.  Neurological: She is alert and oriented to person, place, and time. No cranial nerve deficit.  Skin: Skin is warm and dry.  Psychiatric: She has a normal mood and affect. Her behavior is normal. Judgment and thought content  normal.          Assessment & Plan:  1. Recurrent Falls: Pt has had no further falls. However, I am concerned that she may have a relative cortisol deficiency. I will speak with her Primary Care Physician Dr. Lovell Sheehan and confer with an Endocrinologist about referral and consider cortisol levels. Continue PT.  Pt is to follow up with her PMD for further care.   2. ETOH and Tobacco use: I have spoken with patient at length about a healthier lifestyle. She is in a contemplative state with regard to cessation of ETOH use but in a pre-contemplative state with regard to tobacco use. I will deferto Dr. Lovell Sheehan for further brief interventions and long term goals.  More than 50% of visit spent in counseling and discussion re: above.

## 2013-01-19 ENCOUNTER — Ambulatory Visit: Payer: PRIVATE HEALTH INSURANCE | Admitting: Rehabilitative and Restorative Service Providers"

## 2013-01-25 ENCOUNTER — Ambulatory Visit: Payer: PRIVATE HEALTH INSURANCE | Admitting: Physical Therapy

## 2013-01-28 ENCOUNTER — Ambulatory Visit: Payer: PRIVATE HEALTH INSURANCE | Admitting: Physical Therapy

## 2013-04-21 ENCOUNTER — Other Ambulatory Visit: Payer: Self-pay

## 2013-07-08 ENCOUNTER — Telehealth: Payer: Self-pay | Admitting: Internal Medicine

## 2013-07-08 NOTE — Telephone Encounter (Signed)
Called patient to call to schedule annual CPE. Patient has "[employer]" insurance card. Unsure if we accept card, and is willing to self pay. Made patient aware of minimum $15.00 due at visit, accounting will bill balance. Patient needs exact cost of visit. Will forward to OM for pricing.

## 2013-09-02 ENCOUNTER — Telehealth: Payer: Self-pay

## 2013-09-02 NOTE — Telephone Encounter (Signed)
Appointment confirmed. Patient stated that she would call back to complete pre visit call.

## 2013-09-06 NOTE — Telephone Encounter (Signed)
Patient called to return your phone call back regarding pre visit call.

## 2013-09-06 NOTE — Telephone Encounter (Signed)
Medication and allergies:  Reviewed and updated  90 day supply/mail order: n/a Local pharmacy:  [facility]   Immunizations due:  Tdap-DUE?   A/P: No changes to personal, family history or past surgical hx PAP- 08/05/06-negative; DUE CCS-never had one MMG- patient reported having one within the last  5 years BD- 11/09/02 Tdap- unsure   To Discuss with Provider: Fatigue, syncopal episodes, and "falling a lot"-patient described a recent episode in which she passed out.  When she came to, she realized that she had passed out and urinated on herself.

## 2013-09-06 NOTE — Telephone Encounter (Signed)
Called patient back.  Patient stated that she was driving and would call me back before 3pm today.

## 2013-09-07 ENCOUNTER — Ambulatory Visit (INDEPENDENT_AMBULATORY_CARE_PROVIDER_SITE_OTHER): Payer: No Typology Code available for payment source | Admitting: Family Medicine

## 2013-09-07 ENCOUNTER — Telehealth: Payer: Self-pay | Admitting: Family Medicine

## 2013-09-07 ENCOUNTER — Encounter: Payer: Self-pay | Admitting: Family Medicine

## 2013-09-07 VITALS — BP 98/68 | HR 104 | Temp 99.0°F | Resp 16 | Ht 68.0 in | Wt 146.1 lb

## 2013-09-07 DIAGNOSIS — E039 Hypothyroidism, unspecified: Secondary | ICD-10-CM

## 2013-09-07 DIAGNOSIS — E785 Hyperlipidemia, unspecified: Secondary | ICD-10-CM

## 2013-09-07 DIAGNOSIS — H547 Unspecified visual loss: Secondary | ICD-10-CM

## 2013-09-07 DIAGNOSIS — R209 Unspecified disturbances of skin sensation: Secondary | ICD-10-CM

## 2013-09-07 DIAGNOSIS — E209 Hypoparathyroidism, unspecified: Secondary | ICD-10-CM

## 2013-09-07 DIAGNOSIS — M549 Dorsalgia, unspecified: Secondary | ICD-10-CM

## 2013-09-07 DIAGNOSIS — G8929 Other chronic pain: Secondary | ICD-10-CM | POA: Insufficient documentation

## 2013-09-07 DIAGNOSIS — R202 Paresthesia of skin: Secondary | ICD-10-CM | POA: Insufficient documentation

## 2013-09-07 LAB — CBC WITH DIFFERENTIAL/PLATELET
BASOS ABS: 0 10*3/uL (ref 0.0–0.1)
Basophils Relative: 0.8 % (ref 0.0–3.0)
Eosinophils Absolute: 0.1 10*3/uL (ref 0.0–0.7)
Eosinophils Relative: 2.1 % (ref 0.0–5.0)
HEMATOCRIT: 40.9 % (ref 36.0–46.0)
Hemoglobin: 13.3 g/dL (ref 12.0–15.0)
LYMPHS ABS: 2.7 10*3/uL (ref 0.7–4.0)
Lymphocytes Relative: 48.5 % — ABNORMAL HIGH (ref 12.0–46.0)
MCHC: 32.4 g/dL (ref 30.0–36.0)
MCV: 92 fl (ref 78.0–100.0)
MONO ABS: 0.6 10*3/uL (ref 0.1–1.0)
MONOS PCT: 10.4 % (ref 3.0–12.0)
NEUTROS PCT: 38.2 % — AB (ref 43.0–77.0)
Neutro Abs: 2.1 10*3/uL (ref 1.4–7.7)
PLATELETS: 332 10*3/uL (ref 150.0–400.0)
RBC: 4.45 Mil/uL (ref 3.87–5.11)
RDW: 14.8 % — AB (ref 11.5–14.6)
WBC: 5.6 10*3/uL (ref 4.5–10.5)

## 2013-09-07 LAB — BASIC METABOLIC PANEL
BUN: 15 mg/dL (ref 6–23)
CHLORIDE: 99 meq/L (ref 96–112)
CO2: 27 meq/L (ref 19–32)
Calcium: 8.1 mg/dL — ABNORMAL LOW (ref 8.4–10.5)
Creatinine, Ser: 0.8 mg/dL (ref 0.4–1.2)
GFR: 89.38 mL/min (ref >60.00)
Glucose, Bld: 107 mg/dL — ABNORMAL HIGH (ref 70–99)
POTASSIUM: 4 meq/L (ref 3.5–5.1)
Sodium: 136 mEq/L (ref 135–145)

## 2013-09-07 LAB — HEPATIC FUNCTION PANEL
ALBUMIN: 4.4 g/dL (ref 3.5–5.2)
ALK PHOS: 58 U/L (ref 39–117)
ALT: 59 U/L — AB (ref 0–35)
AST: 47 U/L — AB (ref 0–37)
Bilirubin, Direct: 0.1 mg/dL (ref 0.0–0.3)
Total Bilirubin: 0.7 mg/dL (ref 0.3–1.2)
Total Protein: 8.5 g/dL — ABNORMAL HIGH (ref 6.0–8.3)

## 2013-09-07 LAB — LIPID PANEL
CHOLESTEROL: 251 mg/dL — AB (ref 0–200)
HDL: 116.5 mg/dL (ref >39.00)
LDL CALC: 122 mg/dL — AB (ref 0–99)
TRIGLYCERIDES: 65 mg/dL (ref 0.0–149.0)
Total CHOL/HDL Ratio: 2
VLDL: 13 mg/dL (ref 0.0–40.0)

## 2013-09-07 LAB — TSH: TSH: 0.96 u[IU]/mL (ref 0.35–5.50)

## 2013-09-07 MED ORDER — TRAMADOL HCL 50 MG PO TABS: 50.0000 mg | ORAL_TABLET | Freq: Four times a day (QID) | ORAL | Status: DC | PRN

## 2013-09-07 MED ORDER — CALCITRIOL 0.25 MCG PO CAPS: 0.5000 ug | ORAL_CAPSULE | Freq: Every day | ORAL | Status: DC

## 2013-09-07 MED ORDER — ROSUVASTATIN CALCIUM 10 MG PO TABS: 10.0000 mg | ORAL_TABLET | Freq: Every day | ORAL | Status: DC

## 2013-09-07 MED ORDER — LEVOTHYROXINE SODIUM 100 MCG PO TABS: 100.0000 ug | ORAL_TABLET | Freq: Every day | ORAL | Status: DC

## 2013-09-07 NOTE — Telephone Encounter (Signed)
Relevant patient education assigned to patient using Emmi. ° °

## 2013-09-07 NOTE — Progress Notes (Signed)
   Subjective:    Patient ID: Tricia Woodward, female    DOB: [birth year], 58 y.o.   MRN: [id number]  HPI New to establish.  No insurance recently.  No regular medical care since 2009.  Hyperlipidemia- chronic problem, on Crestor.  Due for labs.  Denies abd pain, N/V.  Hypothyroid- chronic problem, on Levothyroxine.  Pt reports increased fatigue, tingling in hands.  'i never want to get up'.    hypoparathyoidism- chronic problem, was on Calcitriol but has been out x6 months.  Has not seen Endo recently.  Chronic back pain- 'i have disc problems', 'when it goes out, it's out'.  Pt reports Tramadol controls pain.  Has not had recent prescription but got med illegally from a 'veteran that has the prescription'.   Review of Systems For ROS see HPI     Objective:   Physical Exam  Vitals reviewed. Constitutional: She is oriented to person, place, and time. She appears well-developed and well-nourished. No distress.  HENT:  Head: Normocephalic and atraumatic.  Eyes: Conjunctivae and EOM are normal. Pupils are equal, round, and reactive to light.  Neck: Normal range of motion. Neck supple. Thyromegaly present.  Cardiovascular: Normal rate, regular rhythm, normal heart sounds and intact distal pulses.   No murmur heard. Pulmonary/Chest: Effort normal and breath sounds normal. No respiratory distress.  Abdominal: Soft. She exhibits no distension. There is no tenderness.  Musculoskeletal: She exhibits no edema.  Lymphadenopathy:    She has no cervical adenopathy.  Neurological: She is alert and oriented to person, place, and time.  Skin: Skin is warm and dry.  Psychiatric:  Odd affect, dramatic gestures/behavior, tangential thought process          Assessment & Plan:

## 2013-09-07 NOTE — Progress Notes (Signed)
Pre visit review using our clinic review tool, if applicable. No additional management support is needed unless otherwise documented below in the visit note. 

## 2013-09-07 NOTE — Patient Instructions (Signed)
Follow up in 4-6 weeks to recheck symptoms and discuss lab results We'll notify you of your lab results and make any changes if needed We'll call you with Endocrinology appt for your parathyroid issues Take the prescriptions to the pharmacies to try and get the best price Call with any questions or concerns Hang in there!!!

## 2013-09-08 LAB — PTH, INTACT AND CALCIUM
Calcium: 8 mg/dL — ABNORMAL LOW (ref 8.4–10.5)
PTH: 31.1 pg/mL (ref 14.0–72.0)

## 2013-09-11 NOTE — Assessment & Plan Note (Signed)
New to provider, ongoing for pt.  On Synthroid.  Check labs to determine if dose is appropriate in the setting of pt's fatigue and other complaints.

## 2013-09-11 NOTE — Assessment & Plan Note (Signed)
New to provider, ongoing for pt.  Has not followed up w/ Endo as suggested.  Check labs.  Refer back to endo for ongoing management.

## 2013-09-11 NOTE — Assessment & Plan Note (Signed)
New to provider, ongoing for pt.  Pt suspects this is due to her calcium level.  Check labs.  If Ca is normal, discussed that she may need further evaluation.  Pt not interested in pursuing this.  Will follow.

## 2013-09-11 NOTE — Assessment & Plan Note (Signed)
Chronic problem, tolerating statin w/o difficulty.  Get labs to determine if medication dose is appropriate.  Will follow.

## 2013-09-11 NOTE — Assessment & Plan Note (Signed)
New to provider, ongoing for pt.  Stressed that she should not be getting any medications illegally, especially controlled substances.  Script provided.  Will follow.

## 2013-09-12 ENCOUNTER — Encounter: Payer: Self-pay | Admitting: Family Medicine

## 2013-09-12 ENCOUNTER — Other Ambulatory Visit: Payer: Self-pay | Admitting: Family Medicine

## 2013-09-12 NOTE — Addendum Note (Signed)
Addended by: Midge Minium on: 09/12/2013 09:53 AM   Modules accepted: Orders

## 2013-09-13 ENCOUNTER — Ambulatory Visit: Payer: No Typology Code available for payment source | Admitting: Endocrinology

## 2013-09-19 ENCOUNTER — Encounter: Payer: Self-pay | Admitting: Family Medicine

## 2013-09-19 NOTE — Telephone Encounter (Signed)
The referrals for endocrinology and ophthalmology have been sent to [facility]. They will set up any specialist appointments with the patient. There is a monthly list of specialties available to these patients. Both specialties were available this month, however they only designate a certain number for visits for patients who are part of this program. The office notes are reviewed and patients are given appointments based on necessity. They will send [place] a notification at the end of the month with appt information. If patient is feeling worse or having new symptoms, we may need to have this documented in her chart and send it to [facility] to ensure she gets an appointment this month.

## 2013-09-23 NOTE — Telephone Encounter (Signed)
4.10.15 pt is calling back wanting a sooner apt with the endocrinologist.  Pt also wants to have answers as to why she is not getting any answers regarding her rx from the pharmacist.  Explained that the pharmacist would have to address her concerns regarding the RX.  Put was rude and would not let me speak.  Tried to explain that the referral had been addressed and that there was no way I could look to see where she was on the wait list for the [facility].  Pt was still being rude regards to the answers that was given.  Pt stated that she was just running in circles.  apologized to pt but she wanted answers right now.  Explained that i was sending a message back to the cma or rn for md, but she wanted to personally speak with MD only.  Advised that i would send message back for pt.  Ended call.

## 2013-09-24 LAB — VITAMIN D 1,25 DIHYDROXY
VITAMIN D 1, 25 (OH) TOTAL: 45 pg/mL (ref 18–72)
VITAMIN D3 1, 25 (OH): 45 pg/mL
Vitamin D2 1, 25 (OH)2: <8 pg/mL

## 2013-09-28 ENCOUNTER — Telehealth: Payer: Self-pay | Admitting: Family Medicine

## 2013-09-28 NOTE — Telephone Encounter (Signed)
Patient called and stated that [facility] on [address] states that they have been trying to call us regarding rx Rocaltrol.  Please advise

## 2013-09-28 NOTE — Telephone Encounter (Signed)
Patient called back and stated that her pharmacy was suppose to be calling to see if there was an alternative for Rocaltrol. Patient states its too expensive. Please advise.

## 2013-09-28 NOTE — Telephone Encounter (Signed)
Called pt pharmacy and they stated they have not been trying to contact us. Advised pt has active refills and she needs to call them to have them filled. Left a detailed message notifying pt.

## 2013-09-29 ENCOUNTER — Other Ambulatory Visit: Payer: Self-pay | Admitting: Family Medicine

## 2013-09-29 DIAGNOSIS — E209 Hypoparathyroidism, unspecified: Secondary | ICD-10-CM

## 2013-09-29 DIAGNOSIS — E039 Hypothyroidism, unspecified: Secondary | ICD-10-CM

## 2013-09-29 NOTE — Telephone Encounter (Signed)
Dr Loanne Drilling said he would see pt.  They can make medication decisions.

## 2013-09-29 NOTE — Telephone Encounter (Signed)
Referral reordered.

## 2013-10-07 ENCOUNTER — Ambulatory Visit: Payer: No Typology Code available for payment source | Admitting: Family Medicine

## 2013-10-11 ENCOUNTER — Ambulatory Visit (INDEPENDENT_AMBULATORY_CARE_PROVIDER_SITE_OTHER): Payer: No Typology Code available for payment source | Admitting: Endocrinology

## 2013-10-11 ENCOUNTER — Encounter: Payer: Self-pay | Admitting: Endocrinology

## 2013-10-11 VITALS — BP 108/60 | HR 67 | Temp 98.3°F | Ht 68.0 in | Wt 146.0 lb

## 2013-10-11 DIAGNOSIS — E209 Hypoparathyroidism, unspecified: Secondary | ICD-10-CM

## 2013-10-11 NOTE — Patient Instructions (Signed)
Our office has requested Forteo from pt assistance.

## 2013-10-11 NOTE — Progress Notes (Signed)
Subjective:    Patient ID: Tricia Woodward, female    DOB: 03-May-1956, 58 y.o.   MRN: [id number]  HPI i last saw this pt some years ago.  She had a thyroidectomy in the 1970's, in [place], for suspicious nodules, which proved on pathol to be benign.  Since then she has had hypoparathyroidism.  She hasA moderate numbness of the perioral area, and assoc leg cramps.  She was rx'ed with rocaltrol, but she has been out for 6 months.  Even when she was on this, she felt poorly.  Past Medical History  Diagnosis Date  . Thyroid disease   . Allergy   . Arthritis   . Heart murmur   . Low blood sugar   . Frequent falls   . Hemorrhoids     Past Surgical History  Procedure Laterality Date  . Abdominal hysterectomy    . Thyroid surgery Bilateral 1970    History   Social History  . Marital Status: Single    Spouse Name: N/A    Number of Children: N/A  . Years of Education: N/A   Occupational History  . Not on file.   Social History Main Topics  . Smoking status: Heavy Tobacco Smoker -- 0.50 packs/day    Types: Cigarettes    Start date: 12/16/1978  . Smokeless tobacco: Current User     Comment: pt states that she is ready to quit.  . Alcohol Use: Yes  . Drug Use: No  . Sexual Activity: Not on file   Other Topics Concern  . Not on file   Social History Narrative  . No narrative on file    Current Outpatient Prescriptions on File Prior to Visit  Medication Sig Dispense Refill  . aspirin 81 MG tablet Take 81 mg by mouth daily.       . Calcium-Magnesium-Vitamin D (CALCIUM 500 PO) Take 1 capsule by mouth daily.      . chlorhexidine (PERIDEX) 0.12 % solution Use as directed 15 mLs in the mouth or throat 2 (two) times daily.      . fish oil-omega-3 fatty acids 1000 MG capsule Take 1 g by mouth daily.       . Ginkgo Biloba 500 MG CAPS Take 1 capsule by mouth daily.      . Glucosamine 500 MG CAPS Take 1 capsule by mouth daily.      Marland Kitchen ibuprofen (ADVIL,MOTRIN) 800 MG tablet Take 800 mg  by mouth every 8 (eight) hours as needed.      Marland Kitchen levothyroxine (SYNTHROID, LEVOTHROID) 100 MCG tablet Take 1 tablet (100 mcg total) by mouth daily before breakfast.  30 tablet  3  . Magnesium 250 MG TABS Take 1 tablet by mouth daily.      . Multiple Vitamin (MULTIVITAMIN) tablet Take 1 tablet by mouth daily.      . rosuvastatin (CRESTOR) 10 MG tablet Take 1 tablet (10 mg total) by mouth daily.  30 tablet  3  . traMADol (ULTRAM) 50 MG tablet Take 1 tablet (50 mg total) by mouth every 6 (six) hours as needed.  60 tablet  0  . vitamin B-12 (CYANOCOBALAMIN) 1000 MCG tablet Take 1,000 mcg by mouth daily.      . calcitRIOL (ROCALTROL) 0.25 MCG capsule Take 2 capsules (0.5 mcg total) by mouth daily.  60 capsule  3   No current facility-administered medications on file prior to visit.    Allergies  Allergen Reactions  . Penicillins  REACTION: Yeast Infections    Family History  Problem Relation Age of Onset  . Hypertension Father   . Hypertension Sister   . Arthritis    . Hyperlipidemia    . Aneurysm      BP 108/60  Pulse 67  Temp(Src) 98.3 F (36.8 C) (Oral)  Ht 5\' 8"  (1.727 m)  Wt 146 lb (66.225 kg)  BMI 22.20 kg/m2  SpO2 99%  Review of Systems denies cramps, memory loss, palpitations, sob, visual loss, n/v, seizure, back pain, brbpr, hematuria, and rhinorrhea.  She has falls, low-back pain, and easy bruising.  She has lost a few lbs.  She had a syncopal episode in early 2015.    Objective:   Physical Exam VS: see vs page GEN: no distress HEAD: head: no deformity eyes: no periorbital swelling, no proptosis external nose and ears are normal mouth: no lesion seen NECK: a healed scar is present.  i do not appreciate a nodule in the thyroid or elsewhere in the neck CHEST WALL: no deformity LUNGS:  Clear to auscultation CV: reg rate and rhythm, no murmur ABD: abdomen is soft, nontender.  no hepatosplenomegaly.  not distended.  no hernia.   MUSCULOSKELETAL: muscle bulk and  strength are grossly normal.  no obvious joint swelling.  gait is normal and steady EXTEMITIES: no deformity.  no ulcer on the feet.  feet are of normal color and temp.  no edema PULSES: dorsalis pedis intact bilat.  no carotid bruit NEURO:  cn 2-12 grossly intact.   readily moves all 4's.  sensation is intact to touch on the feet.   SKIN:  Normal texture and temperature.  No rash or suspicious lesion is visible.   NODES:  None palpable at the neck.   PSYCH: alert, well-oriented.  Does not appear anxious nor depressed.    Lab Results  Component Value Date   PTH 31.1 09/07/2013   CALCIUM 8.1* 09/07/2013   CALCIUM 8.0* 09/07/2013   CAION 0.84* 08/06/2006       Assessment & Plan:  Postsurgical hypoparathyroidism: the therapy for this is changing from palliation with rocaltrol to hormone replacement.  [name] is approved now, so we'll pursue.  For now, we'll rx with forteo.  Our office contacted [employer] pt assistance.  They won't provide it for hypoparathyroidism, as it is off-label.   Syncopal episode, uncertain if related to hypocalcemia. Falls: this places her at risk for fractures.

## 2013-10-12 ENCOUNTER — Encounter: Payer: Self-pay | Admitting: Family Medicine

## 2013-10-12 ENCOUNTER — Encounter: Payer: Self-pay | Admitting: Endocrinology

## 2013-10-12 ENCOUNTER — Telehealth: Payer: Self-pay | Admitting: Endocrinology

## 2013-10-12 NOTE — Telephone Encounter (Signed)
Please call pt regarding her insulin

## 2013-10-14 NOTE — Addendum Note (Signed)
Addended by: Renato Shin on: 10/14/2013 05:14 PM   Modules accepted: Orders

## 2013-10-17 NOTE — Telephone Encounter (Signed)
Message left on her machine to call me

## 2013-10-27 ENCOUNTER — Other Ambulatory Visit: Payer: Self-pay | Admitting: *Deleted

## 2013-10-27 ENCOUNTER — Telehealth: Payer: Self-pay | Admitting: Endocrinology

## 2013-10-27 ENCOUNTER — Telehealth: Payer: Self-pay | Admitting: *Deleted

## 2013-10-27 MED ORDER — CALCITRIOL 0.5 MCG PO CAPS: 0.5000 ug | ORAL_CAPSULE | Freq: Every day | ORAL | Status: DC

## 2013-10-27 MED ORDER — INSULIN PEN NEEDLE 32G X 4 MM MISC: Status: DC

## 2013-10-27 NOTE — Telephone Encounter (Signed)
please call patient: In order to get coverage for the once-a-day injection, you must first take calcitriol. Please stop taking the forteo, and start calcitriol.  i have sent a prescription to your pharmacy. Please come back for a follow-up appointment in 2 weeks.

## 2013-10-27 NOTE — Telephone Encounter (Signed)
Patient would like an Rx of syringe needles she has not had her insulin in 2 days  Patient would like them from health dept If she gets from pharmacy she does not have the finance to receive the needles   Thank you :)

## 2013-10-28 ENCOUNTER — Encounter: Payer: Self-pay | Admitting: Endocrinology

## 2013-10-28 MED ORDER — VITAMIN D (ERGOCALCIFEROL) 1.25 MG (50000 UNIT) PO CAPS: ORAL_CAPSULE | ORAL | Status: DC

## 2013-10-28 NOTE — Telephone Encounter (Signed)
Pt advised via voicemail. 

## 2013-10-28 NOTE — Telephone Encounter (Signed)
Ok, i changed it to another vitamin-d product.  i have sent a prescription to your pharmacy Please come back for a follow-up appointment in 2-3 weeks.

## 2013-10-28 NOTE — Telephone Encounter (Signed)
Pt instructed on below. She states that the Calcitriol is over 170 dollars and with a discount it is 70 and she cannot afford to take that. Please advise, Thanks!

## 2013-10-31 ENCOUNTER — Encounter: Payer: Self-pay | Admitting: Endocrinology

## 2013-11-17 ENCOUNTER — Other Ambulatory Visit: Payer: Self-pay | Admitting: Family Medicine

## 2013-11-17 ENCOUNTER — Encounter: Payer: Self-pay | Admitting: General Practice

## 2013-11-17 NOTE — Telephone Encounter (Signed)
Caller name: Klynn Relation to pt: Call back number: [phone]   Reason for call:    Pt wants a refill on RX traMADol (ULTRAM) 50 MG tablet.  Pt would like this today.  Informed her of office policy for refill request time frames.

## 2013-11-17 NOTE — Telephone Encounter (Signed)
Called pt and notified that Rx was at front desk for pick up due to needing Controlled Substance Agreement filled out.

## 2013-11-17 NOTE — Telephone Encounter (Signed)
Last OC 09-07-13 Med filled same day #60 with 0  NO CSC or UDS, no upcoming appts.

## 2013-12-07 ENCOUNTER — Encounter: Payer: Self-pay | Admitting: Family Medicine

## 2013-12-07 ENCOUNTER — Encounter: Payer: Self-pay | Admitting: Endocrinology

## 2014-01-25 ENCOUNTER — Other Ambulatory Visit: Payer: Self-pay | Admitting: Endocrinology

## 2014-02-21 ENCOUNTER — Encounter: Payer: Self-pay | Admitting: Family Medicine

## 2014-02-21 ENCOUNTER — Encounter: Payer: Self-pay | Admitting: Endocrinology

## 2014-02-27 ENCOUNTER — Encounter: Payer: Self-pay | Admitting: Endocrinology

## 2014-02-27 ENCOUNTER — Ambulatory Visit (INDEPENDENT_AMBULATORY_CARE_PROVIDER_SITE_OTHER): Payer: No Typology Code available for payment source | Admitting: Endocrinology

## 2014-02-27 VITALS — BP 118/70 | HR 87 | Temp 98.4°F | Ht 68.0 in | Wt 137.0 lb

## 2014-02-27 DIAGNOSIS — R3589 Other polyuria: Secondary | ICD-10-CM | POA: Insufficient documentation

## 2014-02-27 DIAGNOSIS — E209 Hypoparathyroidism, unspecified: Secondary | ICD-10-CM

## 2014-02-27 DIAGNOSIS — R358 Other polyuria: Secondary | ICD-10-CM

## 2014-02-27 DIAGNOSIS — E039 Hypothyroidism, unspecified: Secondary | ICD-10-CM

## 2014-02-27 LAB — URINALYSIS, ROUTINE W REFLEX MICROSCOPIC
Bilirubin Urine: NEGATIVE
Hgb urine dipstick: NEGATIVE
KETONES UR: NEGATIVE
Leukocytes, UA: NEGATIVE
Nitrite: NEGATIVE
Specific Gravity, Urine: 1.01 (ref 1.000–1.030)
TOTAL PROTEIN, URINE-UPE24: NEGATIVE
Urine Glucose: NEGATIVE
Urobilinogen, UA: 0.2 (ref 0.0–1.0)
pH: 7 (ref 5.0–8.0)

## 2014-02-27 LAB — BASIC METABOLIC PANEL
BUN: 15 mg/dL (ref 6–23)
CHLORIDE: 102 meq/L (ref 96–112)
CO2: 25 meq/L (ref 19–32)
CREATININE: 1.1 mg/dL (ref 0.4–1.2)
Calcium: 9.7 mg/dL (ref 8.4–10.5)
GFR: 63.37 mL/min (ref >60.00)
Glucose, Bld: 78 mg/dL (ref 70–99)
Potassium: 3.1 mEq/L — ABNORMAL LOW (ref 3.5–5.1)
Sodium: 139 mEq/L (ref 135–145)

## 2014-02-27 LAB — TSH: TSH: 0.78 u[IU]/mL (ref 0.35–4.50)

## 2014-02-27 LAB — VITAMIN D 25 HYDROXY (VIT D DEFICIENCY, FRACTURES): VITD: 113.74 ng/mL (ref 30.00–100.00)

## 2014-02-27 NOTE — Progress Notes (Signed)
Subjective:    Patient ID: Tricia Woodward, female    DOB: Nov 01, 1955, 58 y.o.   MRN: [id number]  HPI Pr returns for f/u of postsurgical hypoparathyroidism (she had a thyroidectomy in the 1970's, in [place], for suspicious nodules, which proved on pathol to be benign; since then she has had hypoparathyroidism.  She was rx'ed with rocaltrol, but she has not recently taken it; in [place], she was found to have low vitamin-D, and was rx'ed with this).  She also took forteo samples, and finished 2 weeks ago.  She has slight dryness of the mouth, and assoc polyuria.   Past Medical History  Diagnosis Date  . Thyroid disease   . Allergy   . Arthritis   . Heart murmur   . Low blood sugar   . Frequent falls   . Hemorrhoids     Past Surgical History  Procedure Laterality Date  . Abdominal hysterectomy    . Thyroid surgery Bilateral 1970    History   Social History  . Marital Status: Single    Spouse Name: N/A    Number of Children: N/A  . Years of Education: N/A   Occupational History  . Not on file.   Social History Main Topics  . Smoking status: Heavy Tobacco Smoker -- 0.50 packs/day    Types: Cigarettes    Start date: 12/16/1978  . Smokeless tobacco: Current User     Comment: pt states that she is ready to quit.  . Alcohol Use: Yes  . Drug Use: No  . Sexual Activity: Not on file   Other Topics Concern  . Not on file   Social History Narrative  . No narrative on file    Current Outpatient Prescriptions on File Prior to Visit  Medication Sig Dispense Refill  . aspirin 81 MG tablet Take 81 mg by mouth daily.       . Calcium-Magnesium-Vitamin D (CALCIUM 500 PO) Take 1 capsule by mouth daily.      . chlorhexidine (PERIDEX) 0.12 % solution Use as directed 15 mLs in the mouth or throat 2 (two) times daily.      . fish oil-omega-3 fatty acids 1000 MG capsule Take 1 g by mouth daily.       . Ginkgo Biloba 500 MG CAPS Take 1 capsule by mouth daily.      . Glucosamine 500 MG  CAPS Take 1 capsule by mouth daily.      Marland Kitchen ibuprofen (ADVIL,MOTRIN) 800 MG tablet Take 800 mg by mouth every 8 (eight) hours as needed.      . Insulin Pen Needle 32G X 4 MM MISC Use 2 pen needles per day  100 each  3  . levothyroxine (SYNTHROID, LEVOTHROID) 100 MCG tablet Take 1 tablet (100 mcg total) by mouth daily before breakfast.  30 tablet  3  . Magnesium 250 MG TABS Take 1 tablet by mouth daily.      . Multiple Vitamin (MULTIVITAMIN) tablet Take 1 tablet by mouth daily.      . rosuvastatin (CRESTOR) 10 MG tablet Take 1 tablet (10 mg total) by mouth daily.  30 tablet  3  . traMADol (ULTRAM) 50 MG tablet TAKE ONE TABLET BY MOUTH EVERY 6 HOURS AS NEEDED  60 tablet  0  . vitamin B-12 (CYANOCOBALAMIN) 1000 MCG tablet Take 1,000 mcg by mouth daily.      . Vitamin D, Ergocalciferol, (DRISDOL) 50000 UNITS CAPS capsule TAKE ONE CAPSULE BY MOUTH THREE TIMES A  WEEK  12 capsule  0   No current facility-administered medications on file prior to visit.    Allergies  Allergen Reactions  . Penicillins     REACTION: Yeast Infections    Family History  Problem Relation Age of Onset  . Hypertension Father   . Hypertension Sister   . Arthritis    . Hyperlipidemia    . Aneurysm      BP 118/70  Pulse 87  Temp(Src) 98.4 F (36.9 C) (Oral)  Ht 5' 8"  (1.727 m)  Wt 137 lb (62.143 kg)  BMI 20.84 kg/m2  SpO2 97%    Review of Systems She has weight loss.  Denies cramps.    Objective:   Physical Exam VITAL SIGNS:  See vs page GENERAL: no distress Ext: no edema.   25-OH vit-D=114  Lab Results  Component Value Date   TSH 0.78 02/27/2014    Lab Results  Component Value Date   PTH 31.1 09/07/2013   CALCIUM 9.7 02/27/2014   CAION 0.84* 08/06/2006      Assessment & Plan:  Hypervitaminosis-D, new, due to overreplacement. Hypoparathyroidism: Ca++ is improved, but we have to lower the vit-D level. Polyuria, new, uncertain etiology Hypothyroidism, well-replaced.      Patient is  advised the following: Patient Instructions  blood tests are being requested for you today.  We'll contact you with results.    (addendum: d/c vit-d tabs, ret 30d)

## 2014-02-27 NOTE — Patient Instructions (Addendum)
blood tests are being requested for you today.  We'll contact you with results.  

## 2014-02-28 LAB — PTH, INTACT AND CALCIUM
Calcium: 9.7 mg/dL (ref 8.4–10.5)
PTH: 3 pg/mL — AB (ref 14–64)

## 2014-02-28 LAB — SODIUM, URINE, RANDOM: Sodium, Ur: 108 mEq/L

## 2014-03-07 ENCOUNTER — Other Ambulatory Visit: Payer: Self-pay | Admitting: Endocrinology

## 2014-03-07 DIAGNOSIS — E209 Hypoparathyroidism, unspecified: Secondary | ICD-10-CM

## 2014-03-07 LAB — ARGININE VASOPRESSIN HORMONE: ARGININE VASOPRESSIN: 2.2 pg/mL

## 2014-03-13 ENCOUNTER — Encounter: Payer: Self-pay | Admitting: Endocrinology

## 2014-03-15 MED ORDER — ROSUVASTATIN CALCIUM 10 MG PO TABS: 10.0000 mg | ORAL_TABLET | Freq: Every day | ORAL | Status: DC

## 2014-03-15 NOTE — Telephone Encounter (Signed)
Med filled.  

## 2014-03-22 ENCOUNTER — Other Ambulatory Visit: Payer: Self-pay | Admitting: General Practice

## 2014-03-22 ENCOUNTER — Telehealth: Payer: Self-pay | Admitting: Family Medicine

## 2014-03-22 MED ORDER — SIMVASTATIN 40 MG PO TABS: 40.0000 mg | ORAL_TABLET | Freq: Every day | ORAL | Status: DC

## 2014-03-22 NOTE — Telephone Encounter (Signed)
Caller name: Emmanulle or anyone Relation to pt: [facility] Call back number:  [phone] Pharmacy: [facility]  Reason for call:  Pharmacy states that pt can not afford the rosuvastatin (CRESTOR) 10 MG tablet; they did get the coupon, but it only works with insurance, and she doesn't have insurance.  They want to know if there is any other Rx's that would be appropriate.  Pt is at the pharmacy at this time.

## 2014-03-22 NOTE — Telephone Encounter (Signed)
Can switch pt to Simvastatin 40mg  daily, #30, 3 refills.  Pt is due for lipid f/u (it's been 6 months)

## 2014-03-22 NOTE — Telephone Encounter (Signed)
Med filled.  

## 2014-04-04 ENCOUNTER — Other Ambulatory Visit: Payer: Self-pay | Admitting: General Practice

## 2014-04-04 MED ORDER — LEVOTHYROXINE SODIUM 100 MCG PO TABS: 100.0000 ug | ORAL_TABLET | Freq: Every day | ORAL | Status: DC

## 2014-04-06 NOTE — Telephone Encounter (Signed)
error 

## 2014-04-28 ENCOUNTER — Ambulatory Visit (INDEPENDENT_AMBULATORY_CARE_PROVIDER_SITE_OTHER): Payer: No Typology Code available for payment source | Admitting: Family Medicine

## 2014-04-28 ENCOUNTER — Encounter: Payer: Self-pay | Admitting: Family Medicine

## 2014-04-28 VITALS — BP 116/72 | HR 74 | Temp 98.1°F | Resp 16 | Wt 140.2 lb

## 2014-04-28 DIAGNOSIS — G8929 Other chronic pain: Secondary | ICD-10-CM

## 2014-04-28 DIAGNOSIS — E038 Other specified hypothyroidism: Secondary | ICD-10-CM

## 2014-04-28 DIAGNOSIS — E785 Hyperlipidemia, unspecified: Secondary | ICD-10-CM

## 2014-04-28 DIAGNOSIS — M549 Dorsalgia, unspecified: Secondary | ICD-10-CM

## 2014-04-28 LAB — LIPID PANEL
Cholesterol: 231 mg/dL — ABNORMAL HIGH (ref 0–200)
HDL: 99.9 mg/dL (ref >39.00)
LDL Cholesterol: 123 mg/dL — ABNORMAL HIGH (ref 0–99)
NonHDL: 131.1
Total CHOL/HDL Ratio: 2
Triglycerides: 41 mg/dL (ref 0.0–149.0)
VLDL: 8.2 mg/dL (ref 0.0–40.0)

## 2014-04-28 LAB — HEPATIC FUNCTION PANEL
ALBUMIN: 3.6 g/dL (ref 3.5–5.2)
ALT: 42 U/L — ABNORMAL HIGH (ref 0–35)
AST: 37 U/L (ref 0–37)
Alkaline Phosphatase: 84 U/L (ref 39–117)
Bilirubin, Direct: 0.1 mg/dL (ref 0.0–0.3)
Total Bilirubin: 0.6 mg/dL (ref 0.2–1.2)
Total Protein: 8.2 g/dL (ref 6.0–8.3)

## 2014-04-28 LAB — TSH: TSH: 1.51 u[IU]/mL (ref 0.35–4.50)

## 2014-04-28 MED ORDER — TRAMADOL HCL 50 MG PO TABS: 50.0000 mg | ORAL_TABLET | Freq: Two times a day (BID) | ORAL | Status: DC

## 2014-04-28 NOTE — Assessment & Plan Note (Signed)
Chronic problem.  Currently asymptomatic.  Check labs.  Adjust meds prn  

## 2014-04-28 NOTE — Assessment & Plan Note (Signed)
Chronic problem.  Tolerating statin w/o difficulty.  Check labs.  Adjust meds prn  

## 2014-04-28 NOTE — Assessment & Plan Note (Signed)
Chronic problem.  Pt has managed to wean her tramadol use considerably.  Refill provided.

## 2014-04-28 NOTE — Patient Instructions (Signed)
Schedule your physical in 6 months We'll notify you of your lab results and make any changes if needed Call with any questions or concerns Happy Holidays!!!

## 2014-04-28 NOTE — Progress Notes (Signed)
   Subjective:    Patient ID: Tricia Woodward, female    DOB: Feb 19, 1956, 58 y.o.   MRN: [id number]  HPI Hyperlipidemia- chronic problem, on Simvastatin.  No abd pain, N/V.  Chronic hx of myalgias.  Chronic back pain- pt is taking Tramadol sparingly but is out of meds.  With weather changes, pt will have difficulty getting up and walking.  Hypothyroid- chronic problem.  Denies fatigue, changes to bowels, changes to skin/hair/nails   Review of Systems For ROS see HPI     Objective:   Physical Exam  Constitutional: She is oriented to person, place, and time. She appears well-developed and well-nourished. No distress.  HENT:  Head: Normocephalic and atraumatic.  Eyes: Conjunctivae and EOM are normal. Pupils are equal, round, and reactive to light.  Neck: Normal range of motion. Neck supple. No thyromegaly present.  Cardiovascular: Normal rate, regular rhythm, normal heart sounds and intact distal pulses.   No murmur heard. Pulmonary/Chest: Effort normal and breath sounds normal. No respiratory distress.  Abdominal: Soft. She exhibits no distension. There is no tenderness.  Musculoskeletal: She exhibits no edema.  Lymphadenopathy:    She has no cervical adenopathy.  Neurological: She is alert and oriented to person, place, and time.  Skin: Skin is warm and dry.  Psychiatric: She has a normal mood and affect. Her behavior is normal.  Vitals reviewed.         Assessment & Plan:

## 2014-04-28 NOTE — Progress Notes (Signed)
Pre visit review using our clinic review tool, if applicable. No additional management support is needed unless otherwise documented below in the visit note. 

## 2014-08-09 ENCOUNTER — Other Ambulatory Visit: Payer: Self-pay | Admitting: General Practice

## 2014-08-09 MED ORDER — LEVOTHYROXINE SODIUM 100 MCG PO TABS: 100.0000 ug | ORAL_TABLET | Freq: Every day | ORAL | Status: DC

## 2014-09-10 ENCOUNTER — Other Ambulatory Visit: Payer: Self-pay | Admitting: Family Medicine

## 2014-09-14 ENCOUNTER — Other Ambulatory Visit: Payer: Self-pay | Admitting: Family Medicine

## 2014-09-14 NOTE — Telephone Encounter (Signed)
Med filled.  

## 2014-10-05 ENCOUNTER — Encounter: Payer: Self-pay | Admitting: Endocrinology

## 2014-10-05 ENCOUNTER — Encounter: Payer: Self-pay | Admitting: Family Medicine

## 2014-10-10 ENCOUNTER — Ambulatory Visit (INDEPENDENT_AMBULATORY_CARE_PROVIDER_SITE_OTHER): Payer: 59 | Admitting: Endocrinology

## 2014-10-10 ENCOUNTER — Encounter: Payer: Self-pay | Admitting: Endocrinology

## 2014-10-10 VITALS — BP 90/54 | HR 79 | Temp 98.4°F | Wt 146.0 lb

## 2014-10-10 DIAGNOSIS — H547 Unspecified visual loss: Secondary | ICD-10-CM | POA: Diagnosis not present

## 2014-10-10 DIAGNOSIS — E209 Hypoparathyroidism, unspecified: Secondary | ICD-10-CM

## 2014-10-10 DIAGNOSIS — I959 Hypotension, unspecified: Secondary | ICD-10-CM

## 2014-10-10 DIAGNOSIS — E890 Postprocedural hypothyroidism: Secondary | ICD-10-CM

## 2014-10-10 LAB — TSH: TSH: 1.3 u[IU]/mL (ref 0.35–4.50)

## 2014-10-10 LAB — BASIC METABOLIC PANEL
BUN: 12 mg/dL (ref 6–23)
CO2: 33 mEq/L — ABNORMAL HIGH (ref 19–32)
Calcium: 7.8 mg/dL — ABNORMAL LOW (ref 8.4–10.5)
Chloride: 102 mEq/L (ref 96–112)
Creatinine, Ser: 0.82 mg/dL (ref 0.40–1.20)
GFR: 91.56 mL/min (ref >60.00)
Glucose, Bld: 82 mg/dL (ref 70–99)
Potassium: 3.4 mEq/L — ABNORMAL LOW (ref 3.5–5.1)
SODIUM: 139 meq/L (ref 135–145)

## 2014-10-10 LAB — VITAMIN D 25 HYDROXY (VIT D DEFICIENCY, FRACTURES): VITD: 42.37 ng/mL (ref 30.00–100.00)

## 2014-10-10 LAB — PHOSPHORUS: PHOSPHORUS: 4.9 mg/dL — AB (ref 2.3–4.6)

## 2014-10-10 LAB — CORTISOL
CORTISOL PLASMA: 17.9 ug/dL
Cortisol, Plasma: 7.4 ug/dL

## 2014-10-10 LAB — MAGNESIUM: MAGNESIUM: 2 mg/dL (ref 1.5–2.5)

## 2014-10-10 MED ORDER — COSYNTROPIN 0.25 MG IJ SOLR
0.2500 mg | Freq: Once | INTRAMUSCULAR | Status: AC
  Administered 2014-10-10: 0.25 mg via INTRAMUSCULAR

## 2014-10-10 NOTE — Patient Instructions (Addendum)
Please see an eye specialist.  you will receive a phone call, about a day and time for an appointment. blood tests are requested for you today.  We'll let you know about the results. Please come back for a follow-up appointment in 4 months.

## 2014-10-10 NOTE — Progress Notes (Signed)
Subjective:    Patient ID: Tricia NEUZIL, female    DOB: Dec 21, 1955, 59 y.o.   MRN: [id number]  HPI  The state of at least three ongoing medical problems is addressed today, with interval history of each noted here: Pr returns for f/u of postsurgical hypoparathyroidism (she had a thyroidectomy in the 1970's, in [place], for suspicious nodules, which proved on pathol to be benign; since then she has had hypoparathyroidism.  She was rx'ed with rocaltrol, but she has not recently taken it; in [place], she was found to have low vitamin-D, and was rx'ed with this).  She also took forteo samples for a few mos in 2015, but had to d/c as it was declined by insurance.   In late 2015, she was noted to have a high 25-OH-D level. She has been off ergocalciferol since then.   She says hypotension is chronic and intermittent.  She says she is recently falling more often than usual.  She feels this is due to needing to get new glasses.  She has intermittent numbness of the hands--chronic, no change.   Hypothyroidism: depression is much better recently.  Past Medical History  Diagnosis Date  . Thyroid disease   . Allergy   . Arthritis   . Heart murmur   . Low blood sugar   . Frequent falls   . Hemorrhoids     Past Surgical History  Procedure Laterality Date  . Abdominal hysterectomy    . Thyroid surgery Bilateral 1970    History   Social History  . Marital Status: Single    Spouse Name: N/A  . Number of Children: N/A  . Years of Education: N/A   Occupational History  . Not on file.   Social History Main Topics  . Smoking status: Heavy Tobacco Smoker -- 0.50 packs/day    Types: Cigarettes    Start date: 12/16/1978  . Smokeless tobacco: Current User     Comment: pt states that she is ready to quit.  . Alcohol Use: Yes  . Drug Use: No  . Sexual Activity: Not on file   Other Topics Concern  . Not on file   Social History Narrative    Current Outpatient Prescriptions on File Prior  to Visit  Medication Sig Dispense Refill  . aspirin 81 MG tablet Take 81 mg by mouth daily.     . Calcium-Magnesium-Vitamin D (CALCIUM 500 PO) Take 1 capsule by mouth daily.    . chlorhexidine (PERIDEX) 0.12 % solution Use as directed 15 mLs in the mouth or throat 2 (two) times daily.    . fish oil-omega-3 fatty acids 1000 MG capsule Take 1 g by mouth daily.     . Ginkgo Biloba 500 MG CAPS Take 1 capsule by mouth daily.    . Glucosamine 500 MG CAPS Take 1 capsule by mouth daily.    Marland Kitchen ibuprofen (ADVIL,MOTRIN) 800 MG tablet Take 800 mg by mouth every 8 (eight) hours as needed.    . Insulin Pen Needle 32G X 4 MM MISC Use 2 pen needles per day 100 each 3  . levothyroxine (SYNTHROID, LEVOTHROID) 100 MCG tablet Take 1 tablet (100 mcg total) by mouth daily before breakfast. 30 tablet 3  . Magnesium 250 MG TABS Take 1 tablet by mouth daily.    . Multiple Vitamin (MULTIVITAMIN) tablet Take 1 tablet by mouth daily.    . simvastatin (ZOCOR) 40 MG tablet TAKE ONE TABLET BY MOUTH AT BEDTIME 30 tablet 2  .  traMADol (ULTRAM) 50 MG tablet Take 1 tablet (50 mg total) by mouth 2 (two) times daily. 60 tablet 0  . vitamin B-12 (CYANOCOBALAMIN) 1000 MCG tablet Take 1,000 mcg by mouth daily.     No current facility-administered medications on file prior to visit.    Allergies  Allergen Reactions  . Penicillins     REACTION: Yeast Infections    Family History  Problem Relation Age of Onset  . Hypertension Father   . Hypertension Sister   . Arthritis    . Hyperlipidemia    . Aneurysm      BP 90/54 mmHg  Pulse 79  Temp(Src) 98.4 F (36.9 C) (Oral)  Wt 146 lb (66.225 kg)  SpO2 95%   Review of Systems  She denies muscle cramps and weight change.    Objective:   Physical Exam VITAL SIGNS:  See vs page GENERAL: no distress Gait: normal and steady.  Lab Results  Component Value Date   PTH 15 10/10/2014   CALCIUM 7.5* 10/10/2014   CALCIUM 7.8* 10/10/2014   CAION 0.84* 08/06/2006   PHOS  4.9* 10/10/2014    acth stimulation test is done: baseline cortisol level=7 then cosyntropin 250 mcg is given im 45 minutes later, cortisol level=18 (normal response)  25-OH-vit-D=42  Lab Results  Component Value Date   TSH 1.30 10/10/2014      Assessment & Plan:  Postsurgical hypoparathyroidism, worse Hypotension, uncertain etiology.  Adrenal insufficiency is excluded.  No further endocrine w/u is needed for this. Vit-D deficiency: no more rx needed now.  We'll follow. Hypothyroidism: well-replaced.  Please continue the same synthroid.

## 2014-10-11 ENCOUNTER — Encounter: Payer: Self-pay | Admitting: Endocrinology

## 2014-10-11 LAB — PTH, INTACT AND CALCIUM
Calcium: 7.5 mg/dL — ABNORMAL LOW (ref 8.4–10.5)
PTH: 15 pg/mL (ref 14–64)

## 2014-10-11 MED ORDER — CALCITRIOL 0.25 MCG PO CAPS: 0.2500 ug | ORAL_CAPSULE | Freq: Every day | ORAL | Status: DC

## 2014-10-31 ENCOUNTER — Telehealth: Payer: Self-pay | Admitting: Family Medicine

## 2014-10-31 NOTE — Telephone Encounter (Signed)
Caller name:Krystina Lyles, self Can be reached: [phone]  Reason for call:Pt asked if she needs to have blood work on 11/02/14. States that it was just done by Dr. Loanne Drilling.

## 2014-11-01 NOTE — Telephone Encounter (Signed)
Pt notified that she is due to cholesterol and BP follow up. Pt advised to come in fasting.

## 2014-11-02 ENCOUNTER — Encounter: Payer: Self-pay | Admitting: Family Medicine

## 2014-11-02 ENCOUNTER — Ambulatory Visit (INDEPENDENT_AMBULATORY_CARE_PROVIDER_SITE_OTHER): Payer: 59 | Admitting: Family Medicine

## 2014-11-02 VITALS — BP 102/62 | HR 66 | Temp 97.9°F | Resp 16 | Wt 146.5 lb

## 2014-11-02 DIAGNOSIS — E785 Hyperlipidemia, unspecified: Secondary | ICD-10-CM

## 2014-11-02 DIAGNOSIS — N3281 Overactive bladder: Secondary | ICD-10-CM

## 2014-11-02 NOTE — Patient Instructions (Signed)
Schedule your complete physical in 6 months We'll notify you of your lab results and make any changes if needed If you decide you want to try a medication for overactive bladder- let me know.  Or if interested, we could send you to urology for a complete evaluation Try and quit smoking Call with any questions or concerns [place] [facility]!

## 2014-11-02 NOTE — Assessment & Plan Note (Signed)
Chronic problem.  Pt is tolerating statin w/o difficulty.  Check labs.  Adjust meds prn  

## 2014-11-02 NOTE — Progress Notes (Signed)
Pre visit review using our clinic review tool, if applicable. No additional management support is needed unless otherwise documented below in the visit note. 

## 2014-11-02 NOTE — Progress Notes (Signed)
   Subjective:    Patient ID: Tricia Woodward, female    DOB: 09-03-1955, 59 y.o.   MRN: [id number]  HPI Hyperlipidemia- chronic problem, on Simvastatin and fish oil.  Denies abd pain, N/v, myalgias.  No CP, SOB, HAs, visual changes, edema.  Polyuria- sugar was normal on recent labs.  'i've been peeing a long time'.  Pt reports she will urinate every 60-90 minutes.  Waking 3-4x/night.  Pt has urgency and 'there are times I barely make it and times I don't make it'.     Review of Systems For ROS see HPI     Objective:   Physical Exam  Constitutional: She is oriented to person, place, and time. She appears well-developed and well-nourished. No distress.  Smells of smoke  HENT:  Head: Normocephalic and atraumatic.  Eyes: Conjunctivae and EOM are normal. Pupils are equal, round, and reactive to light.  Neck: Normal range of motion. Neck supple. No thyromegaly present.  Cardiovascular: Normal rate, regular rhythm, normal heart sounds and intact distal pulses.   No murmur heard. Pulmonary/Chest: Effort normal and breath sounds normal. No respiratory distress.  Abdominal: Soft. She exhibits no distension. There is no tenderness.  Musculoskeletal: She exhibits no edema.  Lymphadenopathy:    She has no cervical adenopathy.  Neurological: She is alert and oriented to person, place, and time.  Skin: Skin is warm and dry.  Psychiatric: She has a normal mood and affect. Her behavior is normal.  Vitals reviewed.         Assessment & Plan:

## 2014-11-02 NOTE — Assessment & Plan Note (Signed)
New.  Pt's sxs are consistent w/ OAB.  Discussed possibly starting medication vs urology referral but pt would like to work on increasing her water intake, limiting her caffeine intake and seeing if sxs improve.  If not, pt able to call back and we can start a medication at that time.  Pt expressed understanding and is in agreement w/ plan.

## 2014-11-03 LAB — LIPID PANEL
CHOL/HDL RATIO: 2
Cholesterol: 251 mg/dL — ABNORMAL HIGH (ref 0–200)
HDL: 111.3 mg/dL (ref >39.00)
LDL CALC: 126 mg/dL — AB (ref 0–99)
NONHDL: 139.7
TRIGLYCERIDES: 67 mg/dL (ref 0.0–149.0)
VLDL: 13.4 mg/dL (ref 0.0–40.0)

## 2014-11-03 LAB — HEPATIC FUNCTION PANEL
ALBUMIN: 4.4 g/dL (ref 3.5–5.2)
ALK PHOS: 73 U/L (ref 39–117)
ALT: 45 U/L — AB (ref 0–35)
AST: 40 U/L — ABNORMAL HIGH (ref 0–37)
BILIRUBIN DIRECT: 0.1 mg/dL (ref 0.0–0.3)
Total Bilirubin: 0.4 mg/dL (ref 0.2–1.2)
Total Protein: 8.6 g/dL — ABNORMAL HIGH (ref 6.0–8.3)

## 2014-11-06 ENCOUNTER — Other Ambulatory Visit: Payer: Self-pay | Admitting: Family Medicine

## 2014-11-06 DIAGNOSIS — R7989 Other specified abnormal findings of blood chemistry: Secondary | ICD-10-CM

## 2014-11-06 DIAGNOSIS — R945 Abnormal results of liver function studies: Principal | ICD-10-CM

## 2014-11-11 ENCOUNTER — Other Ambulatory Visit: Payer: Self-pay | Admitting: Endocrinology

## 2014-12-12 ENCOUNTER — Other Ambulatory Visit: Payer: Self-pay | Admitting: Endocrinology

## 2014-12-12 ENCOUNTER — Other Ambulatory Visit: Payer: Self-pay | Admitting: Family Medicine

## 2014-12-12 NOTE — Telephone Encounter (Signed)
Med filled.  

## 2015-01-09 ENCOUNTER — Other Ambulatory Visit: Payer: Self-pay | Admitting: Endocrinology

## 2015-01-16 ENCOUNTER — Other Ambulatory Visit: Payer: Self-pay | Admitting: Family Medicine

## 2015-01-17 NOTE — Telephone Encounter (Signed)
Last OV 11/02/14 Tramadol last filled 04/28/14 #60 with 0

## 2015-01-17 NOTE — Telephone Encounter (Signed)
Medication filled to pharmacy as requested.   

## 2015-01-20 ENCOUNTER — Other Ambulatory Visit: Payer: Self-pay | Admitting: Family Medicine

## 2015-01-22 NOTE — Telephone Encounter (Signed)
Last OV 11/02/14 Tramadol Last filled 01/17/15 #60 with 0  Called and spoke with pharmacist, pt did fill it on 01/17/15

## 2015-01-29 ENCOUNTER — Encounter: Payer: Self-pay | Admitting: Family Medicine

## 2015-02-13 ENCOUNTER — Other Ambulatory Visit: Payer: Self-pay | Admitting: Endocrinology

## 2015-02-21 ENCOUNTER — Other Ambulatory Visit: Payer: Self-pay | Admitting: Family Medicine

## 2015-02-21 NOTE — Telephone Encounter (Signed)
Medication filled to pharmacy as requested.   

## 2015-02-26 ENCOUNTER — Telehealth: Payer: Self-pay | Admitting: Endocrinology

## 2015-02-26 NOTE — Telephone Encounter (Signed)
Patient need the DX code for Tsh, for [facility] call ask for [place] [phone]

## 2015-02-27 NOTE — Telephone Encounter (Signed)
I called [facility] and provided the dx codes.

## 2015-03-13 ENCOUNTER — Other Ambulatory Visit: Payer: Self-pay | Admitting: Endocrinology

## 2015-03-19 ENCOUNTER — Encounter: Payer: Self-pay | Admitting: Endocrinology

## 2015-03-26 ENCOUNTER — Other Ambulatory Visit: Payer: Self-pay | Admitting: Endocrinology

## 2015-04-17 ENCOUNTER — Encounter: Payer: Self-pay | Admitting: Endocrinology

## 2015-04-17 ENCOUNTER — Ambulatory Visit (INDEPENDENT_AMBULATORY_CARE_PROVIDER_SITE_OTHER): Payer: 59 | Admitting: Endocrinology

## 2015-04-17 VITALS — BP 116/64 | HR 69 | Temp 98.7°F | Ht 68.0 in | Wt 150.0 lb

## 2015-04-17 DIAGNOSIS — E890 Postprocedural hypothyroidism: Secondary | ICD-10-CM

## 2015-04-17 DIAGNOSIS — E209 Hypoparathyroidism, unspecified: Secondary | ICD-10-CM

## 2015-04-17 DIAGNOSIS — I959 Hypotension, unspecified: Secondary | ICD-10-CM | POA: Diagnosis not present

## 2015-04-17 LAB — PHOSPHORUS: Phosphorus: 4.4 mg/dL (ref 2.3–4.6)

## 2015-04-17 LAB — TSH: TSH: 0.87 u[IU]/mL (ref 0.35–4.50)

## 2015-04-17 LAB — MAGNESIUM: MAGNESIUM: 1.9 mg/dL (ref 1.5–2.5)

## 2015-04-17 LAB — VITAMIN D 25 HYDROXY (VIT D DEFICIENCY, FRACTURES): VITD: 46.91 ng/mL (ref 30.00–100.00)

## 2015-04-17 NOTE — Patient Instructions (Addendum)
blood tests are requested for you today.  We'll let you know about the results. Please come back for a follow-up appointment in 4 months.   If the parathyroid level is low, an option is to take a once-a-day injection called "Natpara."  This helps the low calcium better than the calcitriol alone.

## 2015-04-17 NOTE — Progress Notes (Signed)
Subjective:    Patient ID: Tricia Woodward, female    DOB: [birth year], 59 y.o.   MRN: [id number]  HPI The state of at least three ongoing medical problems is addressed today, with interval history of each noted here: Pr returns for f/u of postsurgical hypoparathyroidism (she had a thyroidectomy in the 1970's, in [place], for suspicious nodules, which proved on pathol to be benign; since then she has had hypoparathyroidism. She was rx'ed with rocaltrol, but she has not recently taken it; in [place], she was found to have low vitamin-D, and was rx'ed with this).  She also took forteo samples for a few mos in 2015, but had to d/c as it was declined by insurance.   In late 2015, she was noted to have a high 25-OH-D level. She has been off ergocalciferol since then.   Hypothyroidism: she takes synthroid as rx'ed   She has slight numbness of the hands, and assoc cramps.   Past Medical History  Diagnosis Date  . Thyroid disease   . Allergy   . Arthritis   . Heart murmur   . Low blood sugar   . Frequent falls   . Hemorrhoids     Past Surgical History  Procedure Laterality Date  . Abdominal hysterectomy    . Thyroid surgery Bilateral 1970    Social History   Social History  . Marital Status: Single    Spouse Name: N/A  . Number of Children: N/A  . Years of Education: N/A   Occupational History  . Not on file.   Social History Main Topics  . Smoking status: Heavy Tobacco Smoker -- 0.50 packs/day    Types: Cigarettes    Start date: 12/16/1978  . Smokeless tobacco: Current User     Comment: pt states that she is ready to quit.  . Alcohol Use: Yes  . Drug Use: No  . Sexual Activity: Not on file   Other Topics Concern  . Not on file   Social History Narrative    Current Outpatient Prescriptions on File Prior to Visit  Medication Sig Dispense Refill  . fish oil-omega-3 fatty acids 1000 MG capsule Take 1 g by mouth daily.     Marland Kitchen ibuprofen (ADVIL,MOTRIN) 800 MG tablet Take 800  mg by mouth every 8 (eight) hours as needed.    . Insulin Pen Needle 32G X 4 MM MISC Use 2 pen needles per day 100 each 3  . levothyroxine (SYNTHROID, LEVOTHROID) 100 MCG tablet TAKE ONE TABLET BY MOUTH ONCE DAILY BEFORE BREAKFAST 30 tablet 6  . Multiple Vitamin (MULTIVITAMIN) tablet Take 1 tablet by mouth daily.    . simvastatin (ZOCOR) 40 MG tablet TAKE ONE TABLET BY MOUTH AT BEDTIME 30 tablet 3  . traMADol (ULTRAM) 50 MG tablet TAKE ONE TABLET BY MOUTH TWICE DAILY 60 tablet 0  . vitamin B-12 (CYANOCOBALAMIN) 1000 MCG tablet Take 1,000 mcg by mouth daily.    Marland Kitchen aspirin 81 MG tablet Take 81 mg by mouth daily.      No current facility-administered medications on file prior to visit.    Allergies  Allergen Reactions  . Penicillins     REACTION: Yeast Infections    Family History  Problem Relation Age of Onset  . Hypertension Father   . Hypertension Sister   . Arthritis    . Hyperlipidemia    . Aneurysm      BP 116/64 mmHg  Pulse 69  Temp(Src) 98.7 F (37.1 C) (Oral)  Ht 5\' 8"  (1.727 m)  Wt 150 lb (68.04 kg)  BMI 22.81 kg/m2  SpO2 97%    Review of Systems Denies weight change.  She has slight muscle weakness.      Objective:   Physical Exam VITAL SIGNS:  See vs page GENERAL: no distress Neck: a healed scar is present.  i do not appreciate a nodule in the thyroid or elsewhere in the neck.     Lab Results  Component Value Date   PTH 15 04/17/2015   CALCIUM 8.0* 04/17/2015   CAION 0.84* 08/06/2006   PHOS 4.4 04/17/2015   Lab Results  Component Value Date   TSH 0.87 04/17/2015  25-OH vit-D=47    Assessment & Plan:  Postsurgical hypoparathyroidism. She needs increased rx.  This PTH level is not low enough for "Natpara."   Postsurgical hypothyroidism: well-replaced.    Vit-D deficiency: no rx needed now.  We'll follow.    Patient is advised the following: Patient Instructions  blood tests are requested for you today.  We'll let you know about the  results. Please come back for a follow-up appointment in 4 months.   If the parathyroid level is low, an option is to take a once-a-day injection called "Natpara."  This helps the low calcium better than the calcitriol alone.   addendum: i have sent a prescription to your pharmacy, to increase the rocaltrol

## 2015-04-18 LAB — PTH, INTACT AND CALCIUM
Calcium: 8 mg/dL — ABNORMAL LOW (ref 8.4–10.5)
PTH: 15 pg/mL (ref 14–64)

## 2015-04-18 MED ORDER — CALCITRIOL 0.5 MCG PO CAPS: 0.5000 ug | ORAL_CAPSULE | Freq: Every day | ORAL | Status: DC

## 2015-05-02 ENCOUNTER — Telehealth: Payer: Self-pay

## 2015-05-02 NOTE — Telephone Encounter (Signed)
Called patient to update chart and confirm appointment for tomorrow. Patient states everything is up to date. Advised that we needed information on her immunizations, patient stated she did not have time to review.

## 2015-05-03 ENCOUNTER — Other Ambulatory Visit: Payer: Self-pay | Admitting: Family Medicine

## 2015-05-03 ENCOUNTER — Ambulatory Visit (INDEPENDENT_AMBULATORY_CARE_PROVIDER_SITE_OTHER): Payer: 59 | Admitting: Family Medicine

## 2015-05-03 ENCOUNTER — Encounter: Payer: Self-pay | Admitting: Family Medicine

## 2015-05-03 VITALS — BP 112/74 | HR 73 | Temp 98.1°F | Resp 16 | Ht 68.0 in | Wt 149.1 lb

## 2015-05-03 DIAGNOSIS — Z1231 Encounter for screening mammogram for malignant neoplasm of breast: Secondary | ICD-10-CM | POA: Diagnosis not present

## 2015-05-03 DIAGNOSIS — R7989 Other specified abnormal findings of blood chemistry: Secondary | ICD-10-CM

## 2015-05-03 DIAGNOSIS — Z0000 Encounter for general adult medical examination without abnormal findings: Secondary | ICD-10-CM

## 2015-05-03 DIAGNOSIS — Z1211 Encounter for screening for malignant neoplasm of colon: Secondary | ICD-10-CM

## 2015-05-03 DIAGNOSIS — R945 Abnormal results of liver function studies: Secondary | ICD-10-CM

## 2015-05-03 DIAGNOSIS — Z780 Asymptomatic menopausal state: Secondary | ICD-10-CM

## 2015-05-03 LAB — HEPATIC FUNCTION PANEL
ALBUMIN: 4.3 g/dL (ref 3.5–5.2)
ALT: 53 U/L — ABNORMAL HIGH (ref 0–35)
AST: 50 U/L — ABNORMAL HIGH (ref 0–37)
Alkaline Phosphatase: 56 U/L (ref 39–117)
BILIRUBIN DIRECT: 0.1 mg/dL (ref 0.0–0.3)
TOTAL PROTEIN: 8.2 g/dL (ref 6.0–8.3)
Total Bilirubin: 0.7 mg/dL (ref 0.2–1.2)

## 2015-05-03 LAB — CBC WITH DIFFERENTIAL/PLATELET
BASOS ABS: 0 10*3/uL (ref 0.0–0.1)
Basophils Relative: 0.4 % (ref 0.0–3.0)
Eosinophils Absolute: 0.1 10*3/uL (ref 0.0–0.7)
Eosinophils Relative: 2.3 % (ref 0.0–5.0)
HEMATOCRIT: 41.1 % (ref 36.0–46.0)
Hemoglobin: 13.5 g/dL (ref 12.0–15.0)
LYMPHS PCT: 48.1 % — AB (ref 12.0–46.0)
Lymphs Abs: 3.1 10*3/uL (ref 0.7–4.0)
MCHC: 32.8 g/dL (ref 30.0–36.0)
MCV: 92.4 fl (ref 78.0–100.0)
MONOS PCT: 12.4 % — AB (ref 3.0–12.0)
Monocytes Absolute: 0.8 10*3/uL (ref 0.1–1.0)
Neutro Abs: 2.4 10*3/uL (ref 1.4–7.7)
Neutrophils Relative %: 36.8 % — ABNORMAL LOW (ref 43.0–77.0)
Platelets: 278 10*3/uL (ref 150.0–400.0)
RBC: 4.44 Mil/uL (ref 3.87–5.11)
RDW: 15.3 % (ref 11.5–15.5)
WBC: 6.5 10*3/uL (ref 4.0–10.5)

## 2015-05-03 LAB — LIPID PANEL
Cholesterol: 242 mg/dL — ABNORMAL HIGH (ref 0–200)
HDL: 111 mg/dL (ref >39.00)
LDL CALC: 119 mg/dL — AB (ref 0–99)
NonHDL: 131.05
Total CHOL/HDL Ratio: 2
Triglycerides: 60 mg/dL (ref 0.0–149.0)
VLDL: 12 mg/dL (ref 0.0–40.0)

## 2015-05-03 LAB — BASIC METABOLIC PANEL
BUN: 14 mg/dL (ref 6–23)
CALCIUM: 8.5 mg/dL (ref 8.4–10.5)
CO2: 30 meq/L (ref 19–32)
Chloride: 101 mEq/L (ref 96–112)
Creatinine, Ser: 0.83 mg/dL (ref 0.40–1.20)
GFR: 90.12 mL/min (ref >60.00)
GLUCOSE: 84 mg/dL (ref 70–99)
Potassium: 4.3 mEq/L (ref 3.5–5.1)
SODIUM: 140 meq/L (ref 135–145)

## 2015-05-03 NOTE — Assessment & Plan Note (Signed)
Pt's PE WNL.  Due for mammo, DEXA, colonoscopy- all orders entered.  Refusing Tdap, flu shot.  Check labs.  Anticipatory guidance provided.

## 2015-05-03 NOTE — Progress Notes (Signed)
   Subjective:    Patient ID: Tricia Woodward, female    DOB: 08/23/1955, 59 y.o.   MRN: [id number]  HPI CPE- due for mammogram and bone density.  Open to idea of colonoscopy.  Pt declines flu shot and tetanus.   Review of Systems Patient reports no vision/ hearing changes, adenopathy,fever, weight change,  persistant/recurrent hoarseness , swallowing issues, chest pain, palpitations, edema, persistant/recurrent cough, hemoptysis, dyspnea (rest/exertional/paroxysmal nocturnal), gastrointestinal bleeding (melena, rectal bleeding), abdominal pain, significant heartburn, bowel changes, GU symptoms (dysuria, hematuria, incontinence), Gyn symptoms (abnormal  bleeding, pain),  syncope, focal weakness, memory loss, skin/hair/nail changes, abnormal bruising or bleeding, anxiety, or depression.   + numbness of hands    Objective:   Physical Exam General Appearance:    Alert, cooperative, no distress, appears stated age  Head:    Normocephalic, without obvious abnormality, atraumatic  Eyes:    PERRL, conjunctiva/corneas clear, EOM's intact, fundi    benign, both eyes  Ears:    Normal TM's and external ear canals, both ears  Nose:   Nares normal, septum midline, mucosa normal, no drainage    or sinus tenderness  Throat:   Lips, mucosa, and tongue normal; teeth and gums normal  Neck:   Supple, symmetrical, trachea midline, no adenopathy;    Thyroid: no enlargement/tenderness/nodules  Back:     Symmetric, no curvature, ROM normal, no CVA tenderness  Lungs:     Clear to auscultation bilaterally, respirations unlabored  Chest Wall:    No tenderness or deformity   Heart:    Regular rate and rhythm, S1 and S2 normal, no murmur, rub   or gallop  Breast Exam:    Deferred to mammo  Abdomen:     Soft, non-tender, bowel sounds active all four quadrants,    no masses, no organomegaly  Genitalia:    Deferred at pt's request  Rectal:    Extremities:   Extremities normal, atraumatic, no cyanosis or edema    Pulses:   2+ and symmetric all extremities  Skin:   Skin color, texture, turgor normal, no rashes or lesions  Lymph nodes:   Cervical, supraclavicular, and axillary nodes normal  Neurologic:   CNII-XII intact, normal strength, sensation and reflexes    throughout          Assessment & Plan:

## 2015-05-03 NOTE — Addendum Note (Signed)
Addended by: Caffie Pinto on: 05/03/2015 11:01 AM   Modules accepted: Orders

## 2015-05-03 NOTE — Progress Notes (Signed)
Pre visit review using our clinic review tool, if applicable. No additional management support is needed unless otherwise documented below in the visit note. 

## 2015-05-03 NOTE — Patient Instructions (Signed)
Follow up in 6 months to recheck cholesterol We'll notify you of your lab results and make any changes if needed We'll call you with your GI appt for the colonoscopy consultation We'll call you with your mammogram and bone density appts Continue to work on healthy diet and regular exercise- you look great! Try and quit smoking! Call with any questions or concerns If you want to join [place] at the new [place] office, any scheduled appointments will automatically transfer and we will see you at [address] [place] [address] [place], [place] [address] (OPENING 06/19/15) Happy Holidays!

## 2015-05-04 ENCOUNTER — Encounter: Payer: Self-pay | Admitting: Internal Medicine

## 2015-05-15 ENCOUNTER — Ambulatory Visit (HOSPITAL_BASED_OUTPATIENT_CLINIC_OR_DEPARTMENT_OTHER)
Admission: RE | Admit: 2015-05-15 | Discharge: 2015-05-15 | Disposition: A | Discharge status: Home / Self Care | Payer: 59 | Source: Ambulatory Visit | Attending: Family Medicine | Admitting: Family Medicine

## 2015-05-15 DIAGNOSIS — Z90710 Acquired absence of both cervix and uterus: Secondary | ICD-10-CM | POA: Diagnosis not present

## 2015-05-15 DIAGNOSIS — Z780 Asymptomatic menopausal state: Secondary | ICD-10-CM | POA: Insufficient documentation

## 2015-05-15 DIAGNOSIS — F17200 Nicotine dependence, unspecified, uncomplicated: Secondary | ICD-10-CM | POA: Insufficient documentation

## 2015-05-15 DIAGNOSIS — Z1231 Encounter for screening mammogram for malignant neoplasm of breast: Secondary | ICD-10-CM | POA: Insufficient documentation

## 2015-05-15 DIAGNOSIS — E039 Hypothyroidism, unspecified: Secondary | ICD-10-CM | POA: Diagnosis not present

## 2015-06-13 ENCOUNTER — Other Ambulatory Visit: Payer: Self-pay | Admitting: Family Medicine

## 2015-06-13 NOTE — Telephone Encounter (Signed)
Medication filled to pharmacy as requested.   

## 2015-06-13 NOTE — Telephone Encounter (Signed)
Last OV 05-03-15 Tramadol last filled 01-17-15 #60 with 0

## 2015-06-14 ENCOUNTER — Ambulatory Visit (AMBULATORY_SURGERY_CENTER): Payer: Self-pay | Admitting: *Deleted

## 2015-06-14 VITALS — Ht 68.25 in | Wt 150.0 lb

## 2015-06-14 DIAGNOSIS — Z1211 Encounter for screening for malignant neoplasm of colon: Secondary | ICD-10-CM

## 2015-06-14 MED ORDER — NA SULFATE-K SULFATE-MG SULF 17.5-3.13-1.6 GM/177ML PO SOLN: 1.0000 | Freq: Once | ORAL | Status: DC

## 2015-06-14 NOTE — Progress Notes (Signed)
No egg or soy allergy known to patient  No issues with past sedation with any surgeries  or procedures, no intubation problems  No diet pills per patient  No home 02 use per patient   Pt has [facility] compass now and then [facility] select that will not cover suprep- sample given  Medication Samples have been provided to the patient.  Drug name: suprep  Qty: 1  LOTSO:[id number]  Exp.Date: 11-18  The patient has been instructed regarding the correct time, dose, and frequency of taking this medication, including desired effects and most common side effects.   Brock Bad 8:47 AM 06/14/2015

## 2015-06-17 HISTORY — PX: CARPAL TUNNEL RELEASE: SHX101

## 2015-06-28 ENCOUNTER — Encounter: Payer: Self-pay | Admitting: Internal Medicine

## 2015-06-28 ENCOUNTER — Ambulatory Visit (AMBULATORY_SURGERY_CENTER): Payer: BLUE CROSS/BLUE SHIELD | Admitting: Internal Medicine

## 2015-06-28 VITALS — BP 106/61 | HR 59 | Temp 98.9°F | Resp 19

## 2015-06-28 DIAGNOSIS — Z1211 Encounter for screening for malignant neoplasm of colon: Secondary | ICD-10-CM

## 2015-06-28 DIAGNOSIS — D120 Benign neoplasm of cecum: Secondary | ICD-10-CM

## 2015-06-28 DIAGNOSIS — D122 Benign neoplasm of ascending colon: Secondary | ICD-10-CM

## 2015-06-28 DIAGNOSIS — D124 Benign neoplasm of descending colon: Secondary | ICD-10-CM

## 2015-06-28 MED ORDER — SODIUM CHLORIDE 0.9 % IV SOLN: 500.0000 mL | INTRAVENOUS | Status: DC

## 2015-06-28 NOTE — Progress Notes (Signed)
Patient awakening,vss,report to rn 

## 2015-06-28 NOTE — Op Note (Signed)
[place]  [address] [place], [address]   COLONOSCOPY PROCEDURE REPORT  PATIENT: Tricia Woodward, Tricia Woodward  MR#: [id number] BIRTHDATE: 1955-08-26 , 65  yrs. old GENDER: female ENDOSCOPIST: Eustace Quail, MD REFERRED [id number] Wadie Lessen, M.D. PROCEDURE DATE:  06/28/2015 PROCEDURE:   Colonoscopy, screening and Colonoscopy with snare polypectomy x 3 First Screening Colonoscopy - Avg.  risk and is 50 yrs.  old or older Yes.  Prior Negative Screening - Now for repeat screening. N/A  History of Adenoma - Now for follow-up colonoscopy & has been > or = to 3 yrs.  N/A  Polyps removed today? Yes ASA CLASS:   Class II INDICATIONS:Screening for colonic neoplasia and Colorectal Neoplasm Risk Assessment for this procedure is average risk. MEDICATIONS: Monitored anesthesia care and Propofol 200 mg IV  DESCRIPTION OF PROCEDURE:   After the risks benefits and alternatives of the procedure were thoroughly explained, informed consent was obtained.  The digital rectal exam revealed no abnormalities of the rectum.   The LB [id number]  endoscope was introduced through the anus and advanced to the cecum, which was identified by both the appendix and ileocecal valve. No adverse events experienced.   The quality of the prep was good.  (Suprep was used)  The instrument was then slowly withdrawn as the colon was fully examined. Estimated blood loss is zero unless otherwise noted in this procedure report.    COLON FINDINGS: Three polyps ranging between 3-31mm in size were found in the descending colon, ascending colon, and at the cecum. A polypectomy was performed with a cold snare.  The resection was complete, the polyp tissue was completely retrieved and sent to histology.   The examination was otherwise normal.  Retroflexed views revealed no abnormalities. The time to cecum = 2.7 Withdrawal time = 14.1   The scope was withdrawn and the procedure completed. COMPLICATIONS:  There were no immediate complications.  ENDOSCOPIC IMPRESSION: 1.   Three polyps ranging between 3-41mm in size were found in the descending colon, ascending colon, and at the cecum; polypectomy was performed with a cold snare 2.   The examination was otherwise normal  RECOMMENDATIONS: 1. Follow up colonoscopy in 5 years  eSigned:  Eustace Quail, MD 06/28/2015 2:40 PM   cc: Midge Minium, MD and The Patient

## 2015-06-28 NOTE — Patient Instructions (Signed)
YOU HAD AN ENDOSCOPIC PROCEDURE TODAY AT THE [facility]:   Refer to the procedure report that was given to you for any specific questions about what was found during the examination.  If the procedure report does not answer your questions, please call your gastroenterologist to clarify.  If you requested that your care partner not be given the details of your procedure findings, then the procedure report has been included in a sealed envelope for you to review at your convenience later.  YOU SHOULD EXPECT: Some feelings of bloating in the abdomen. Passage of more gas than usual.  Walking can help get rid of the air that was put into your GI tract during the procedure and reduce the bloating. If you had a lower endoscopy (such as a colonoscopy or flexible sigmoidoscopy) you may notice spotting of blood in your stool or on the toilet paper. If you underwent a bowel prep for your procedure, you may not have a normal bowel movement for a few days.  Please Note:  You might notice some irritation and congestion in your nose or some drainage.  This is from the oxygen used during your procedure.  There is no need for concern and it should clear up in a day or so.  SYMPTOMS TO REPORT IMMEDIATELY:   Following lower endoscopy (colonoscopy or flexible sigmoidoscopy):  Excessive amounts of blood in the stool  Significant tenderness or worsening of abdominal pains  Swelling of the abdomen that is new, acute  Fever of 100F or higher   For urgent or emergent issues, a gastroenterologist can be reached at any hour by calling [phone].   DIET: Your first meal following the procedure should be a small meal and then it is ok to progress to your normal diet. Heavy or fried foods are harder to digest and may make you feel nauseous or bloated.  Likewise, meals heavy in dairy and vegetables can increase bloating.  Drink plenty of fluids but you should avoid alcoholic beverages for 24  hours.  ACTIVITY:  You should plan to take it easy for the rest of today and you should NOT DRIVE or use heavy machinery until tomorrow (because of the sedation medicines used during the test).    FOLLOW UP: Our staff will call the number listed on your records the next business day following your procedure to check on you and address any questions or concerns that you may have regarding the information given to you following your procedure. If we do not reach you, we will leave a message.  However, if you are feeling well and you are not experiencing any problems, there is no need to return our call.  We will assume that you have returned to your regular daily activities without incident.  If any biopsies were taken you will be contacted by phone or by letter within the next 1-3 weeks.  Please call us at [phone] if you have not heard about the biopsies in 3 weeks.    SIGNATURES/CONFIDENTIALITY: You and/or your care partner have signed paperwork which will be entered into your electronic medical record.  These signatures attest to the fact that that the information above on your After Visit Summary has been reviewed and is understood.  Full responsibility of the confidentiality of this discharge information lies with you and/or your care-partner.    Resume medications. Information given on polyps. 

## 2015-06-28 NOTE — Progress Notes (Signed)
Called to room to assist during endoscopic procedure.  Patient ID and intended procedure confirmed with present staff. Received instructions for my participation in the procedure from the performing physician.  

## 2015-06-29 ENCOUNTER — Telehealth: Payer: Self-pay

## 2015-06-29 NOTE — Telephone Encounter (Signed)
  Follow up Call-  Call back number 06/28/2015  Post procedure Call Back phone  # #[phone] cell  Permission to leave phone message Yes     Patient questions:  Do you have a fever, pain , or abdominal swelling? No. Pain Score  0 *  Have you tolerated food without any problems? Yes.    Have you been able to return to your normal activities? Yes.    Do you have any questions about your discharge instructions: Diet   No. Medications  No. Follow up visit  No.  Do you have questions or concerns about your Care? No.  Actions: * If pain score is 4 or above: No action needed, pain <4.

## 2015-07-03 ENCOUNTER — Encounter: Payer: Self-pay | Admitting: Internal Medicine

## 2015-07-13 ENCOUNTER — Other Ambulatory Visit: Payer: Self-pay | Admitting: Family Medicine

## 2015-07-13 NOTE — Telephone Encounter (Signed)
Medication filled to pharmacy as requested.   

## 2015-10-15 ENCOUNTER — Ambulatory Visit: Payer: 59 | Admitting: Endocrinology

## 2015-10-17 ENCOUNTER — Ambulatory Visit (INDEPENDENT_AMBULATORY_CARE_PROVIDER_SITE_OTHER): Payer: BLUE CROSS/BLUE SHIELD | Admitting: Endocrinology

## 2015-10-17 VITALS — BP 124/64 | HR 81 | Temp 98.2°F | Ht 68.0 in | Wt 151.0 lb

## 2015-10-17 DIAGNOSIS — E890 Postprocedural hypothyroidism: Secondary | ICD-10-CM | POA: Diagnosis not present

## 2015-10-17 DIAGNOSIS — E209 Hypoparathyroidism, unspecified: Secondary | ICD-10-CM

## 2015-10-17 LAB — MAGNESIUM: Magnesium: 1.9 mg/dL (ref 1.5–2.5)

## 2015-10-17 LAB — VITAMIN D 25 HYDROXY (VIT D DEFICIENCY, FRACTURES): VITD: 38.33 ng/mL (ref 30.00–100.00)

## 2015-10-17 LAB — TSH: TSH: 1.08 u[IU]/mL (ref 0.35–4.50)

## 2015-10-17 LAB — PHOSPHORUS: Phosphorus: 5.4 mg/dL — ABNORMAL HIGH (ref 2.3–4.6)

## 2015-10-17 NOTE — Patient Instructions (Addendum)
blood tests are requested for you today.  We'll let you know about the results. Please come back for a follow-up appointment in 4 months.   If the parathyroid level is low, an option is to take a once-a-day injection called "Natpara."  This helps the low calcium better than the calcitriol alone.  

## 2015-10-17 NOTE — Progress Notes (Signed)
Subjective:    Patient ID: Tricia Woodward, female    DOB: 1955-08-19, 60 y.o.   MRN: [id number]  HPI The state of at least three ongoing medical problems is addressed today, with interval history of each noted here: Pr returns for f/u of postsurgical hypoparathyroidism (she had a thyroidectomy in the 1970's, in [place], for suspicious nodules, which proved on pathol to be benign; since then she has had hypoparathyroidism. She was rx'ed with rocaltrol, but she has not recently taken it; in mid-2015, she was found to have low vitamin-D, and was rx'ed with this; she also took forteo samples for a few mos in 2015, but had to d/c as it was declined by insurance).  She has muscle weakness. In late 2015, she was noted to have a high 25-OH-D level. She has been off ergocalciferol since then.  No change in chronic cramps Hypothyroidism: she takes synthroid as rx'ed.  She has fatigue.   Past Medical History  Diagnosis Date  . Thyroid disease   . Allergy   . Arthritis   . Heart murmur   . Low blood sugar   . Frequent falls   . Hemorrhoids     past hx     Past Surgical History  Procedure Laterality Date  . Abdominal hysterectomy      partial  . Thyroid surgery Bilateral 1970  . Breast cyst excision  1983  . Bunionectomy    . Dental surgery      Social History   Social History  . Marital Status: Single    Spouse Name: N/A  . Number of Children: N/A  . Years of Education: N/A   Occupational History  . Not on file.   Social History Main Topics  . Smoking status: Heavy Tobacco Smoker -- 0.75 packs/day    Types: Cigarettes    Start date: 12/16/1978  . Smokeless tobacco: Current User     Comment: e cigs at times  . Alcohol Use: 7.2 oz/week    6 Standard drinks or equivalent, 6 Shots of liquor per week     Comment: 1-2 drinks three times a week   . Drug Use: No  . Sexual Activity: Not on file   Other Topics Concern  . Not on file   Social History Narrative    Current  Outpatient Prescriptions on File Prior to Visit  Medication Sig Dispense Refill  . aspirin 81 MG tablet Take 81 mg by mouth daily.     . fish oil-omega-3 fatty acids 1000 MG capsule Take 1 g by mouth daily.     Marland Kitchen levothyroxine (SYNTHROID, LEVOTHROID) 100 MCG tablet TAKE ONE TABLET BY MOUTH ONCE DAILY BEFORE BREAKFAST 30 tablet 6  . Multiple Vitamin (MULTIVITAMIN) tablet Take 1 tablet by mouth daily.    . simvastatin (ZOCOR) 40 MG tablet TAKE ONE TABLET BY MOUTH AT BEDTIME 30 tablet 6  . traMADol (ULTRAM) 50 MG tablet TAKE ONE TABLET BY MOUTH TWICE DAILY (Patient taking differently: PRN) 60 tablet 0  . vitamin B-12 (CYANOCOBALAMIN) 1000 MCG tablet Take 1,000 mcg by mouth daily.     No current facility-administered medications on file prior to visit.    Allergies  Allergen Reactions  . Penicillins     REACTION: Yeast Infections    Family History  Problem Relation Age of Onset  . Hypertension Father   . Hypertension Sister   . Arthritis    . Hyperlipidemia    . Aneurysm    . Colon  cancer Neg Hx   . Esophageal cancer Neg Hx   . Rectal cancer Neg Hx   . Stomach cancer Neg Hx     BP 124/64 mmHg  Pulse 81  Temp(Src) 98.2 F (36.8 C) (Oral)  Ht 5\' 8"  (1.727 m)  Wt 151 lb (68.493 kg)  BMI 22.96 kg/m2  SpO2 97%  Review of Systems No recent weight change.  Denies depression.      Objective:   Physical Exam VITAL SIGNS:  See vs page GENERAL: no distress Neck: a healed scar is present.  i do not appreciate a nodule in the thyroid or elsewhere in the neck. Neuro: sensation is intact to touch on all 4's.   MSK: motor is diffusely 5/5.    Lab Results  Component Value Date   PTH 12* 10/17/2015   CALCIUM 7.6* 10/17/2015   CAION 0.84* 08/06/2006   PHOS 5.4* 10/17/2015  vit-D=33 Lab Results  Component Value Date   TSH 1.08 10/17/2015      Assessment & Plan:  Hypoparathyroidism: she needs increased rx.  She declines natpara for now, but she says she'll consider in the  future.   Vit-D deficiency: no rx is needed now.  We'll follow.  Hypothyroidism: well-replaced.   Patient is advised the following: Patient Instructions  blood tests are requested for you today.  We'll let you know about the results.  Please come back for a follow-up appointment in 4 months.   If the parathyroid level is low, an option is to take a once-a-day injection called "Natpara."  This helps the low calcium better than the calcitriol alone.   addendum: Please continue the same synthroid i have sent a prescription to your pharmacy, to increase rocaltrol.

## 2015-10-18 LAB — PTH, INTACT AND CALCIUM
Calcium: 7.6 mg/dL — ABNORMAL LOW (ref 8.4–10.5)
PTH: 12 pg/mL — ABNORMAL LOW (ref 14–64)

## 2015-10-18 MED ORDER — CALCITRIOL 0.25 MCG PO CAPS: 0.7500 ug | ORAL_CAPSULE | Freq: Every day | ORAL | Status: DC

## 2015-11-06 ENCOUNTER — Ambulatory Visit (INDEPENDENT_AMBULATORY_CARE_PROVIDER_SITE_OTHER): Payer: BLUE CROSS/BLUE SHIELD | Admitting: Family Medicine

## 2015-11-06 ENCOUNTER — Encounter: Payer: Self-pay | Admitting: Family Medicine

## 2015-11-06 VITALS — BP 121/68 | HR 68 | Temp 97.9°F | Resp 17 | Ht 68.0 in | Wt 149.5 lb

## 2015-11-06 DIAGNOSIS — E785 Hyperlipidemia, unspecified: Secondary | ICD-10-CM | POA: Diagnosis not present

## 2015-11-06 NOTE — Patient Instructions (Signed)
Schedule your complete physical in 6 months We'll notify you of your lab results and make any changes if needed Keep up the good work on healthy diet and regular exercise- you look great! Call with any questions or concerns Thanks for sticking with [place]! [place] [employer]!!!

## 2015-11-06 NOTE — Progress Notes (Signed)
   Subjective:    Patient ID: Tricia Woodward, female    DOB: 05/09/1956, 60 y.o.   MRN: [id number]  HPI Hyperlipidemia- chronic problem, on Fish Oil and simvastatin.  No CP, SOB, HAs, visual changes, abd pain, N/V, myalgias.  Exercising regularly.  Continues to smoke- although she states she is down to 1/2 ppd from 3/4 ppd.    Review of Systems For ROS see HPI     Objective:   Physical Exam  Constitutional: She is oriented to person, place, and time. She appears well-developed and well-nourished. No distress.  HENT:  Head: Normocephalic and atraumatic.  Eyes: Conjunctivae and EOM are normal. Pupils are equal, round, and reactive to light.  Neck: Normal range of motion. Neck supple. No thyromegaly present.  Cardiovascular: Normal rate, regular rhythm, normal heart sounds and intact distal pulses.   No murmur heard. Pulmonary/Chest: Effort normal and breath sounds normal. No respiratory distress.  Abdominal: Soft. She exhibits no distension. There is no tenderness.  Musculoskeletal: She exhibits no edema.  Lymphadenopathy:    She has no cervical adenopathy.  Neurological: She is alert and oriented to person, place, and time.  Skin: Skin is warm and dry.  Psychiatric: She has a normal mood and affect. Her behavior is normal.  Vitals reviewed.         Assessment & Plan:

## 2015-11-06 NOTE — Progress Notes (Signed)
Pre visit review using our clinic review tool, if applicable. No additional management support is needed unless otherwise documented below in the visit note. 

## 2015-11-07 LAB — HEPATIC FUNCTION PANEL
ALT: 46 U/L — AB (ref 0–35)
AST: 42 U/L — ABNORMAL HIGH (ref 0–37)
Albumin: 4.3 g/dL (ref 3.5–5.2)
Alkaline Phosphatase: 52 U/L (ref 39–117)
BILIRUBIN TOTAL: 0.6 mg/dL (ref 0.2–1.2)
Bilirubin, Direct: 0.1 mg/dL (ref 0.0–0.3)
TOTAL PROTEIN: 7.3 g/dL (ref 6.0–8.3)

## 2015-11-07 LAB — LIPID PANEL
CHOL/HDL RATIO: 3
Cholesterol: 232 mg/dL — ABNORMAL HIGH (ref 0–200)
HDL: 86.6 mg/dL (ref >39.00)
LDL Cholesterol: 136 mg/dL — ABNORMAL HIGH (ref 0–99)
NonHDL: 145.83
TRIGLYCERIDES: 51 mg/dL (ref 0.0–149.0)
VLDL: 10.2 mg/dL (ref 0.0–40.0)

## 2015-11-07 LAB — CBC WITH DIFFERENTIAL/PLATELET
BASOS ABS: 0 10*3/uL (ref 0.0–0.1)
BASOS PCT: 0.3 % (ref 0.0–3.0)
EOS ABS: 0.2 10*3/uL (ref 0.0–0.7)
Eosinophils Relative: 3 % (ref 0.0–5.0)
HEMATOCRIT: 38 % (ref 36.0–46.0)
HEMOGLOBIN: 12.5 g/dL (ref 12.0–15.0)
LYMPHS PCT: 46.7 % — AB (ref 12.0–46.0)
Lymphs Abs: 2.5 10*3/uL (ref 0.7–4.0)
MCHC: 33 g/dL (ref 30.0–36.0)
MCV: 92.5 fl (ref 78.0–100.0)
MONOS PCT: 12.1 % — AB (ref 3.0–12.0)
Monocytes Absolute: 0.6 10*3/uL (ref 0.1–1.0)
Neutro Abs: 2 10*3/uL (ref 1.4–7.7)
Neutrophils Relative %: 37.9 % — ABNORMAL LOW (ref 43.0–77.0)
Platelets: 262 10*3/uL (ref 150.0–400.0)
RBC: 4.11 Mil/uL (ref 3.87–5.11)
RDW: 15.1 % (ref 11.5–15.5)
WBC: 5.4 10*3/uL (ref 4.0–10.5)

## 2015-11-07 LAB — BASIC METABOLIC PANEL
BUN: 17 mg/dL (ref 6–23)
CALCIUM: 8.5 mg/dL (ref 8.4–10.5)
CHLORIDE: 103 meq/L (ref 96–112)
CO2: 31 mEq/L (ref 19–32)
Creatinine, Ser: 0.86 mg/dL (ref 0.40–1.20)
GFR: 86.35 mL/min (ref >60.00)
Glucose, Bld: 71 mg/dL (ref 70–99)
Potassium: 4.1 mEq/L (ref 3.5–5.1)
Sodium: 139 mEq/L (ref 135–145)

## 2015-11-07 NOTE — Assessment & Plan Note (Signed)
Chronic problem.  Tolerating statin w/o difficulty.  Check labs.  Encouraged healthy diet and regular exercise in addition to smoking cessation.  Check labs.  Adjust meds prn

## 2016-01-15 ENCOUNTER — Telehealth: Payer: Self-pay | Admitting: Family Medicine

## 2016-01-15 NOTE — Telephone Encounter (Signed)
Pt asking for a refill on tramadol, Pt aware that KT is out of office until Weds.

## 2016-01-15 NOTE — Telephone Encounter (Signed)
Last OV: 11/06/15 Last filled: 06/13/15, #60, 0 RF  Sig: TAKE ONE TABLET BY MOUTH TWICE DAILY UDS: 05/03/15, positive for Tramadol and THC, high risk, no more controlled substances

## 2016-01-16 MED ORDER — TRAMADOL HCL 50 MG PO TABS: 50.0000 mg | ORAL_TABLET | Freq: Two times a day (BID) | ORAL | 0 refills | Status: DC | PRN

## 2016-01-16 NOTE — Telephone Encounter (Signed)
Noted will advise pt to pick up at office, and complete UDS

## 2016-01-16 NOTE — Telephone Encounter (Signed)
Ok for #60 but pt will need UDS prior to picking up prescription due to hx of THC

## 2016-02-10 ENCOUNTER — Other Ambulatory Visit: Payer: Self-pay | Admitting: Family Medicine

## 2016-02-19 ENCOUNTER — Ambulatory Visit (INDEPENDENT_AMBULATORY_CARE_PROVIDER_SITE_OTHER): Payer: BLUE CROSS/BLUE SHIELD | Admitting: Endocrinology

## 2016-02-19 ENCOUNTER — Encounter: Payer: Self-pay | Admitting: Endocrinology

## 2016-02-19 ENCOUNTER — Other Ambulatory Visit (INDEPENDENT_AMBULATORY_CARE_PROVIDER_SITE_OTHER): Payer: BLUE CROSS/BLUE SHIELD

## 2016-02-19 VITALS — BP 114/60 | HR 51 | Ht 68.0 in | Wt 148.0 lb

## 2016-02-19 DIAGNOSIS — R3589 Other polyuria: Secondary | ICD-10-CM

## 2016-02-19 DIAGNOSIS — R358 Other polyuria: Secondary | ICD-10-CM | POA: Diagnosis not present

## 2016-02-19 DIAGNOSIS — E209 Hypoparathyroidism, unspecified: Secondary | ICD-10-CM

## 2016-02-19 DIAGNOSIS — E890 Postprocedural hypothyroidism: Secondary | ICD-10-CM

## 2016-02-19 LAB — BASIC METABOLIC PANEL
BUN: 13 mg/dL (ref 6–23)
CALCIUM: 7.9 mg/dL — AB (ref 8.4–10.5)
CHLORIDE: 101 meq/L (ref 96–112)
CO2: 30 meq/L (ref 19–32)
CREATININE: 0.89 mg/dL (ref 0.40–1.20)
GFR: 82.92 mL/min (ref >60.00)
GLUCOSE: 103 mg/dL — AB (ref 70–99)
Potassium: 4.1 mEq/L (ref 3.5–5.1)
Sodium: 137 mEq/L (ref 135–145)

## 2016-02-19 LAB — VITAMIN D 25 HYDROXY (VIT D DEFICIENCY, FRACTURES): VITD: 41.1 ng/mL (ref 30.00–100.00)

## 2016-02-19 LAB — TSH: TSH: 2.61 u[IU]/mL (ref 0.35–4.50)

## 2016-02-19 MED ORDER — PARATHYROID HORMONE (RECOMB) 25 MCG ~~LOC~~ CART: 25.0000 ug | CARTRIDGE | Freq: Every day | SUBCUTANEOUS | 5 refills | Status: DC

## 2016-02-19 NOTE — Patient Instructions (Addendum)
blood tests are requested for you today.  We'll let you know about the results.  Based on the results, I hope to be able to prescribe for you a once-a-day injection called "Natpara."  This helps the low calcium better than the calcitriol alone.  Please come back for a follow-up appointment in 3 months

## 2016-02-19 NOTE — Progress Notes (Signed)
   Subjective:    Patient ID: Tricia Woodward, female    DOB: 06-30-1955, 60 y.o.   MRN: [id number]  HPI The state of at least three ongoing medical problems is addressed today, with interval history of each noted here: Pr returns for f/u of postsurgical hypoparathyroidism (she had a thyroidectomy in the 1970's, in [place], for suspicious nodules, which proved on pathol to be benign; since then she has had hypoparathyroidism. She was rx'ed with rocaltrol, but she has not recently taken it; in mid-2015, she was found to have low vitamin-D, and was rx'ed with this; she also took forteo samples for a few mos in 2015, but had to d/c as it was declined by insurance).  She has muscle weakness. In late 2015, she was noted to have a high 25-OH-D level. She has been off ergocalciferol since then.  No change in chronic cramps.   Hypothyroidism: she takes synthroid as rx'ed.  She has fatigue.    Pt has slight numbness of the hands, and assoc pain.  She was dx'ed with CTS.      Review of Systems Denies fatigue.  She seldom has leg cramps.    Objective:   Physical Exam VITAL SIGNS:  See vs page GENERAL: no distress Pulses: radials are intact bilat.   Skin:  normal color and temp on the hands Neuro: sensation is intact to touch on the hands    Lab Results  Component Value Date   PTH 12 (L) 10/17/2015   CALCIUM 7.9 (L) 02/19/2016   CAION 0.84 (L) 08/06/2006   PHOS 5.4 (H) 10/17/2015  25-OH vit-D=41 Lab Results  Component Value Date   TSH 2.61 02/19/2016      Assessment & Plan:  Hypoparathyroidism: I have sent a prescription to your pharmacy, to add Natpara. Vit-D deficiency: better.  We'll recheck this in the future.  Postsurgical hypothyroidism: well-replaced: Please continue the same medication

## 2016-02-20 ENCOUNTER — Telehealth: Payer: Self-pay | Admitting: Endocrinology

## 2016-02-20 LAB — PTH, INTACT AND CALCIUM
Calcium: 8.2 mg/dL — ABNORMAL LOW (ref 8.6–10.4)
PTH: 10 pg/mL — ABNORMAL LOW (ref 14–64)

## 2016-02-20 NOTE — Telephone Encounter (Signed)
I contacted the patient and left a voicemail advising of instructions from MD. Requested a call back if the patient had any questions or concerns.

## 2016-02-20 NOTE — Telephone Encounter (Signed)
please call patient: I have done natpara.  To qualify for this, please add calcium 1000 mg daily

## 2016-02-21 NOTE — Telephone Encounter (Signed)
Pt returning call

## 2016-02-22 NOTE — Telephone Encounter (Signed)
Patient as you to call her.

## 2016-02-22 NOTE — Telephone Encounter (Signed)
Pt requesting a call back please

## 2016-02-26 NOTE — Telephone Encounter (Signed)
I contacted the patient and left a voicemail advised of instructions.

## 2016-02-27 ENCOUNTER — Telehealth: Payer: Self-pay | Admitting: Endocrinology

## 2016-02-27 NOTE — Telephone Encounter (Signed)
Pt needs a call back at the 312 phone number please

## 2016-02-27 NOTE — Telephone Encounter (Signed)
I contacted the patient and she wanted to clarify if she needs to start 1000 mg calcium at this time. Patient was advised to start the calcium and voiced understanding.

## 2016-02-28 ENCOUNTER — Encounter: Payer: Self-pay | Admitting: Endocrinology

## 2016-03-14 ENCOUNTER — Encounter: Payer: Self-pay | Admitting: Endocrinology

## 2016-03-18 ENCOUNTER — Encounter: Payer: BLUE CROSS/BLUE SHIELD | Attending: Endocrinology | Admitting: Dietician

## 2016-03-18 NOTE — Progress Notes (Signed)
Appointment made in error.  There will be no charge for this visit.  New appointment to be made with Vaughan Basta to discuss the Napara injection.  Briefly discussed importance of avoiding dark soda which patient has been doing as well as vitamin D status review.  Antonieta Iba, RD, LDN

## 2016-03-20 ENCOUNTER — Inpatient Hospital Stay (INDEPENDENT_AMBULATORY_CARE_PROVIDER_SITE_OTHER): Payer: BLUE CROSS/BLUE SHIELD | Admitting: Orthopaedic Surgery

## 2016-03-20 DIAGNOSIS — G5602 Carpal tunnel syndrome, left upper limb: Secondary | ICD-10-CM

## 2016-04-07 ENCOUNTER — Encounter: Payer: Self-pay | Admitting: Endocrinology

## 2016-04-14 NOTE — Telephone Encounter (Signed)
Patient asked if she need to diabetes

## 2016-04-15 ENCOUNTER — Encounter: Payer: BLUE CROSS/BLUE SHIELD | Admitting: Nutrition

## 2016-04-16 ENCOUNTER — Telehealth: Payer: Self-pay | Admitting: Family Medicine

## 2016-04-16 NOTE — Telephone Encounter (Signed)
Mcarthur Rossetti - Patient Acess Mgr said they need to pursue prior authorization for Natpara.  He wants to know if Jinny Blossom has received a prior authorization.  If she has, he would like it faxed to him at [phone].

## 2016-04-16 NOTE — Telephone Encounter (Signed)
I contacted Joe and advised as of 04/17/2016 we had not received a PA for the patient's Napara injection. I requested the fax to be sent to [phone] with attention to my name. Once form is received it will be placed on Dr. Cordelia Pen desk to review.

## 2016-04-17 ENCOUNTER — Encounter (INDEPENDENT_AMBULATORY_CARE_PROVIDER_SITE_OTHER): Payer: Self-pay | Admitting: Orthopaedic Surgery

## 2016-04-17 ENCOUNTER — Ambulatory Visit (INDEPENDENT_AMBULATORY_CARE_PROVIDER_SITE_OTHER): Payer: BLUE CROSS/BLUE SHIELD | Admitting: Orthopaedic Surgery

## 2016-04-17 DIAGNOSIS — Z9889 Other specified postprocedural states: Secondary | ICD-10-CM | POA: Insufficient documentation

## 2016-04-17 MED ORDER — DICLOFENAC SODIUM 1 % TD GEL: 2.0000 g | Freq: Four times a day (QID) | TRANSDERMAL | 5 refills | Status: DC

## 2016-04-17 NOTE — Progress Notes (Signed)
Office Visit Note   Patient: Tricia Woodward           Date of Birth: 19-Nov-1955           MRN: [id number] Visit Date: 04/17/2016              Requested by: Midge Minium, MD [address] [place] [address], [place] [address] PCP: Annye Asa, MD   Assessment & Plan: Visit Diagnoses:  1. Status post carpal tunnel release     Plan:  - patient understands pillar pain with improve with time - begin increasing lifting and activity - f/u prn  Follow-Up Instructions: Return if symptoms worsen or fail to improve.   Orders:  No orders of the defined types were placed in this encounter.  Meds ordered this encounter  Medications  . diclofenac sodium (VOLTAREN) 1 % GEL    Sig: Apply 2 g topically 4 (four) times daily.    Dispense:  1 Tube    Refill:  5      Procedures: No procedures performed   Clinical Data: No additional findings.   Subjective: Chief Complaint  Patient presents with  . Left Wrist - Routine Post Op, Follow-up    03/07/16 CTR LEFT     6 week f/u s/p L CTR.  Doing well.  Just c/o some pillar pain.  Happy with recovery.    Review of Systems   Objective: Vital Signs: There were no vitals taken for this visit.  Physical Exam  Ortho Exam Well healed surgical scar.  Mild pillar pain Specialty Comments:  No specialty comments available.  Imaging: No results found.   PMFS History: Patient Active Problem List   Diagnosis Date Noted  . Status post carpal tunnel release 04/17/2016  . Physical exam 05/03/2015  . OAB (overactive bladder) 11/02/2014  . Post-surgical hypothyroidism 10/10/2014  . Visual loss 10/10/2014  . Hypotension 10/10/2014  . Polyuria 02/27/2014  . Chronic back pain 09/07/2013  . Paresthesia of hand, bilateral 09/07/2013  . Recurrent falls 01/17/2013  . TOBACCO ABUSE 05/15/2010  . CHEST PAIN, PRECORDIAL 05/15/2010  . GOITER 05/14/2010  . Hyperlipidemia 05/14/2010  . HYPOCALCEMIA 05/14/2010  . HYPOPARATHYROIDISM  03/14/2010   Past Medical History:  Diagnosis Date  . Allergy   . Arthritis   . Frequent falls   . Heart murmur   . Hemorrhoids    past hx   . Low blood sugar   . Thyroid disease     Family History  Problem Relation Age of Onset  . Hypertension Father   . Hypertension Sister   . Arthritis    . Hyperlipidemia    . Aneurysm    . Colon cancer Neg Hx   . Esophageal cancer Neg Hx   . Rectal cancer Neg Hx   . Stomach cancer Neg Hx     Past Surgical History:  Procedure Laterality Date  . ABDOMINAL HYSTERECTOMY     partial  . BREAST CYST EXCISION  1983  . BUNIONECTOMY    . DENTAL SURGERY    . THYROID SURGERY Bilateral 1970   Social History   Occupational History  . Not on file.   Social History Main Topics  . Smoking status: Heavy Tobacco Smoker    Packs/day: 0.75    Types: Cigarettes    Start date: 12/16/1978  . Smokeless tobacco: Current User     Comment: e cigs at times  . Alcohol use 7.2 oz/week    6 Standard  drinks or equivalent, 6 Shots of liquor per week     Comment: 1-2 drinks three times a week   . Drug use: No  . Sexual activity: Not on file

## 2016-04-21 ENCOUNTER — Telehealth (INDEPENDENT_AMBULATORY_CARE_PROVIDER_SITE_OTHER): Payer: Self-pay | Admitting: Orthopaedic Surgery

## 2016-04-21 NOTE — Telephone Encounter (Signed)
Patient called advised there medication that was prescribed for her on Friday need prior approval before it can be filled. The pharmacy is the [facility]   The number to contact patient is [phone] or cell * [phone] * preferred ph number

## 2016-04-21 NOTE — Telephone Encounter (Signed)
diclofenac sodium (VOLTAREN) 1 % GEL  Apply 2g four times a day

## 2016-04-22 ENCOUNTER — Encounter: Payer: BLUE CROSS/BLUE SHIELD | Attending: Endocrinology | Admitting: Nutrition

## 2016-04-22 NOTE — Telephone Encounter (Signed)
PA made thru cover my meds, waiting for response, will call pt when approved

## 2016-04-22 NOTE — Progress Notes (Signed)
This patient was to learn how to mix, inject and store the napara medication. She did not bring the medication with her.  In fact, she has not gotten it yet.  The nurse called and left a message for her to call her.  She has not done this as yet.  She had several questions about the side effects and how she will feel on this medication, as well as the bone cancer risk.  Dr. Loanne Drilling came in the discuss these things with her.  He told her that he will need to see her 3-7 days after starting this medication.  She reported good understanding of this. She was given the patient brochure on Napara, and a handout for the steps to mix, given and store the medication.  I reviewed this with her, but she did not want to learn this as yet.   She was told to call the nurse back and schedule a time to start this.   She agreed to do this, and had no final questions.  She will not be charged for this visit, since no instruction was given in any detail.

## 2016-04-22 NOTE — Patient Instructions (Signed)
Call [facility] nurse and schedule an appt. For injection training, when the medication arrives.   Read over literature given on Napara, and call if questions. Make an appt. With Dr. Loanne Drilling, 3-7 days after starting this medication. Call me when you start this.  [phone]

## 2016-04-23 ENCOUNTER — Telehealth (INDEPENDENT_AMBULATORY_CARE_PROVIDER_SITE_OTHER): Payer: Self-pay | Admitting: Orthopaedic Surgery

## 2016-04-23 NOTE — Telephone Encounter (Signed)
Called patient back to advise Rx(Diclofenac) was denied by ins. PA was done and it was Denied today.  Is there anything else we can prescribe?

## 2016-04-23 NOTE — Telephone Encounter (Signed)
Can try mobic 7.5 mg bid #30 2 refills

## 2016-04-23 NOTE — Telephone Encounter (Signed)
Per cover my meds RX was denied. Left message on pts voicemail. Does patient want to try another Rx if Dr Erlinda Hong approves? Please ask patient when she calls back thanks.

## 2016-04-23 NOTE — Telephone Encounter (Signed)
Patient called back concerning Rx. Read patient  the message from [place]. Patient asked if she could get another Rx. Patient also want to know why the first Rx was denied.

## 2016-04-24 ENCOUNTER — Encounter (INDEPENDENT_AMBULATORY_CARE_PROVIDER_SITE_OTHER): Payer: Self-pay | Admitting: Orthopaedic Surgery

## 2016-04-24 MED ORDER — MELOXICAM 7.5 MG PO TABS: ORAL_TABLET | ORAL | 2 refills | Status: DC

## 2016-04-24 NOTE — Telephone Encounter (Signed)
Called pt to advise and no answer. LMOM Rx(MObic) was faxed to pharm [facility]

## 2016-04-25 NOTE — Telephone Encounter (Signed)
[employer] called stating Voltaren Gel was denied because they are under the impresson pt. Has not taken oral anti-inflammatories and failed. Only approved when oral medication is tried first. [employer] canceled the second review. [phone] fax [phone]

## 2016-04-25 NOTE — Telephone Encounter (Signed)
Submitted form [employer] sent to [place]

## 2016-05-12 ENCOUNTER — Telehealth (INDEPENDENT_AMBULATORY_CARE_PROVIDER_SITE_OTHER): Payer: Self-pay | Admitting: Orthopaedic Surgery

## 2016-05-12 ENCOUNTER — Encounter (INDEPENDENT_AMBULATORY_CARE_PROVIDER_SITE_OTHER): Payer: Self-pay | Admitting: Orthopaedic Surgery

## 2016-05-12 NOTE — Telephone Encounter (Signed)
Patient stated she needed to talk to you. No other information was given.

## 2016-05-12 NOTE — Telephone Encounter (Signed)
Patient called this morning to return your call from last week, not sure what the call was regarding but she said it must be important because she received 3 calls, she did not answer because she did not recognize the caller ID.  Cb#: [phone]

## 2016-05-14 NOTE — Telephone Encounter (Signed)
Called pt to advise [employer] approved RX

## 2016-05-16 ENCOUNTER — Encounter: Payer: Self-pay | Admitting: Endocrinology

## 2016-05-16 ENCOUNTER — Ambulatory Visit (INDEPENDENT_AMBULATORY_CARE_PROVIDER_SITE_OTHER): Payer: BLUE CROSS/BLUE SHIELD | Admitting: Endocrinology

## 2016-05-16 DIAGNOSIS — E209 Hypoparathyroidism, unspecified: Secondary | ICD-10-CM

## 2016-05-16 LAB — BASIC METABOLIC PANEL
BUN: 16 mg/dL (ref 6–23)
CHLORIDE: 106 meq/L (ref 96–112)
CO2: 28 meq/L (ref 19–32)
Calcium: 9.3 mg/dL (ref 8.4–10.5)
Creatinine, Ser: 0.76 mg/dL (ref 0.40–1.20)
GFR: 99.42 mL/min (ref >60.00)
GLUCOSE: 88 mg/dL (ref 70–99)
POTASSIUM: 3.8 meq/L (ref 3.5–5.1)
Sodium: 141 mEq/L (ref 135–145)

## 2016-05-16 NOTE — Progress Notes (Signed)
Subjective:    Patient ID: Tricia Woodward, female    DOB: [birth year], 60 y.o.   MRN: [id number]  HPI Pt returns for f/u of postsurgical hypoparathyroidism (she had a thyroidectomy in the 1970's, in [place], for suspicious nodules, which proved on pathol to be benign; since then she has had hypoparathyroidism; in mid-2015, she was found to have low vitamin-D, and was rx'ed ergocalciferol; she also took forteo samples for a few mos in 2015, but had to d/c as it was declined by insurance; she is on chronic rocaltrol). She started natpara 3 days ago.  Since then, she has excessive thirst. Past Medical History:  Diagnosis Date  . Allergy   . Arthritis   . Frequent falls   . Heart murmur   . Hemorrhoids    past hx   . Low blood sugar   . Thyroid disease     Past Surgical History:  Procedure Laterality Date  . ABDOMINAL HYSTERECTOMY     partial  . BREAST CYST EXCISION  1983  . BUNIONECTOMY    . DENTAL SURGERY    . THYROID SURGERY Bilateral 1970    Social History   Social History  . Marital status: Single    Spouse name: N/A  . Number of children: N/A  . Years of education: N/A   Occupational History  . Not on file.   Social History Main Topics  . Smoking status: Heavy Tobacco Smoker    Packs/day: 0.75    Types: Cigarettes    Start date: 12/16/1978  . Smokeless tobacco: Current User     Comment: e cigs at times  . Alcohol use 7.2 oz/week    6 Standard drinks or equivalent, 6 Shots of liquor per week     Comment: 1-2 drinks three times a week   . Drug use: No  . Sexual activity: Not on file   Other Topics Concern  . Not on file   Social History Narrative  . No narrative on file    Current Outpatient Prescriptions on File Prior to Visit  Medication Sig Dispense Refill  . aspirin 81 MG tablet Take 81 mg by mouth daily.     . calcitRIOL (ROCALTROL) 0.25 MCG capsule Take 3 capsules (0.75 mcg total) by mouth daily. 90 capsule 11  . diclofenac sodium (VOLTAREN) 1 % GEL  Apply 2 g topically 4 (four) times daily. 1 Tube 5  . DOCOSAHEXAENOIC ACID PO Take by mouth.    . fish oil-omega-3 fatty acids 1000 MG capsule Take 1 g by mouth daily.     Marland Kitchen levothyroxine (SYNTHROID, LEVOTHROID) 100 MCG tablet TAKE ONE TABLET BY MOUTH ONCE DAILY BEFORE BREAKFAST 30 tablet 6  . Multiple Vitamin (MULTIVITAMIN) tablet Take 1 tablet by mouth daily.    . Parathyroid Hormone, Recomb, (NATPARA) 25 MCG CART Inject 25 mcg into the skin daily. 30 each 5  . simvastatin (ZOCOR) 40 MG tablet TAKE ONE TABLET BY MOUTH AT BEDTIME 30 tablet 6  . traMADol (ULTRAM) 50 MG tablet Take 1 tablet (50 mg total) by mouth 2 (two) times daily as needed. 60 tablet 0  . vitamin B-12 (CYANOCOBALAMIN) 1000 MCG tablet Take 1,000 mcg by mouth daily.     No current facility-administered medications on file prior to visit.     Allergies  Allergen Reactions  . Penicillins     REACTION: Yeast Infections    Family History  Problem Relation Age of Onset  . Hypertension Father   .  Hypertension Sister   . Arthritis    . Hyperlipidemia    . Aneurysm    . Colon cancer Neg Hx   . Esophageal cancer Neg Hx   . Rectal cancer Neg Hx   . Stomach cancer Neg Hx     BP 104/60   Pulse (!) 101   Wt 147 lb (66.7 kg)   SpO2 98%   BMI 22.35 kg/m   Review of Systems Denies cramps.      Objective:   Physical Exam VITAL SIGNS:  See vs page GENERAL: no distress Ext: no edema   Lab Results  Component Value Date   PTH 10 (L) 02/19/2016   CALCIUM 9.3 05/16/2016   CAION 0.84 (L) 08/06/2006   PHOS 5.4 (H) 10/17/2015      Assessment & Plan:  Hypoparathyroidism. Improved to Natpara.  Reduce Rocaltrol to 2 x 0.25 mcg/d Please come back for a follow-up appointment in 2 weeks.

## 2016-05-16 NOTE — Patient Instructions (Signed)
blood tests are requested for you today.  We'll let you know about the results. We will need to take this complex situation in stages Our goal will be to slowly increase the Natpara.

## 2016-05-17 ENCOUNTER — Encounter: Payer: Self-pay | Admitting: Endocrinology

## 2016-05-20 ENCOUNTER — Other Ambulatory Visit: Payer: Self-pay | Admitting: Endocrinology

## 2016-05-30 ENCOUNTER — Ambulatory Visit (INDEPENDENT_AMBULATORY_CARE_PROVIDER_SITE_OTHER): Payer: BLUE CROSS/BLUE SHIELD | Admitting: Endocrinology

## 2016-05-30 ENCOUNTER — Encounter: Payer: Self-pay | Admitting: Endocrinology

## 2016-05-30 VITALS — BP 122/64 | HR 77 | Ht 68.0 in | Wt 148.0 lb

## 2016-05-30 DIAGNOSIS — E209 Hypoparathyroidism, unspecified: Secondary | ICD-10-CM | POA: Diagnosis not present

## 2016-05-30 LAB — BASIC METABOLIC PANEL
BUN: 16 mg/dL (ref 6–23)
CALCIUM: 9.2 mg/dL (ref 8.4–10.5)
CO2: 30 mEq/L (ref 19–32)
CREATININE: 0.81 mg/dL (ref 0.40–1.20)
Chloride: 104 mEq/L (ref 96–112)
GFR: 92.36 mL/min (ref >60.00)
Glucose, Bld: 79 mg/dL (ref 70–99)
Potassium: 4 mEq/L (ref 3.5–5.1)
Sodium: 140 mEq/L (ref 135–145)

## 2016-05-30 MED ORDER — CALCITRIOL 0.25 MCG PO CAPS: 0.2500 ug | ORAL_CAPSULE | Freq: Every day | ORAL | 11 refills | Status: DC

## 2016-05-30 NOTE — Patient Instructions (Addendum)
blood tests are requested for you today.  We'll let you know about the results.  We will need to take this complex situation in stages.   Our goal will be to slowly increase the Natpara, and decrease the calcitriol.

## 2016-05-30 NOTE — Progress Notes (Signed)
Subjective:    Patient ID: Tricia Woodward, female    DOB: 07-30-1955, 60 y.o.   MRN: [id number]  HPI Pt returns for f/u of postsurgical hypoparathyroidism (she had a thyroidectomy in the 1970's, in [place], for suspicious nodules, which proved on pathol to be benign; since then she has had hypoparathyroidism; in mid-2015, she was found to have low vitamin-D, and was rx'ed ergocalciferol; she also took forteo samples for a few mos in 2015, but had to d/c as it was declined by insurance; she is on chronic rocaltrol; [name] was added in Nov of 2017). She takes meds as rx'ed.  pt states she feels well in general Past Medical History:  Diagnosis Date  . Allergy   . Arthritis   . Frequent falls   . Heart murmur   . Hemorrhoids    past hx   . Low blood sugar   . Thyroid disease     Past Surgical History:  Procedure Laterality Date  . ABDOMINAL HYSTERECTOMY     partial  . BREAST CYST EXCISION  1983  . BUNIONECTOMY    . DENTAL SURGERY    . THYROID SURGERY Bilateral 1970    Social History   Social History  . Marital status: Single    Spouse name: N/A  . Number of children: N/A  . Years of education: N/A   Occupational History  . Not on file.   Social History Main Topics  . Smoking status: Heavy Tobacco Smoker    Packs/day: 0.75    Types: Cigarettes    Start date: 12/16/1978  . Smokeless tobacco: Current User     Comment: e cigs at times  . Alcohol use 7.2 oz/week    6 Standard drinks or equivalent, 6 Shots of liquor per week     Comment: 1-2 drinks three times a week   . Drug use: No  . Sexual activity: Not on file   Other Topics Concern  . Not on file   Social History Narrative  . No narrative on file    Current Outpatient Prescriptions on File Prior to Visit  Medication Sig Dispense Refill  . aspirin 81 MG tablet Take 81 mg by mouth daily.     . diclofenac sodium (VOLTAREN) 1 % GEL Apply 2 g topically 4 (four) times daily. 1 Tube 5  . fish oil-omega-3 fatty acids  1000 MG capsule Take 1 g by mouth daily.     Marland Kitchen levothyroxine (SYNTHROID, LEVOTHROID) 100 MCG tablet TAKE ONE TABLET BY MOUTH ONCE DAILY BEFORE BREAKFAST 30 tablet 6  . Multiple Vitamin (MULTIVITAMIN) tablet Take 1 tablet by mouth daily.    . Parathyroid Hormone, Recomb, (NATPARA) 25 MCG CART Inject 25 mcg into the skin daily. 30 each 5  . simvastatin (ZOCOR) 40 MG tablet TAKE ONE TABLET BY MOUTH AT BEDTIME 30 tablet 6  . traMADol (ULTRAM) 50 MG tablet Take 1 tablet (50 mg total) by mouth 2 (two) times daily as needed. 60 tablet 0  . vitamin B-12 (CYANOCOBALAMIN) 1000 MCG tablet Take 1,000 mcg by mouth daily.     No current facility-administered medications on file prior to visit.     Allergies  Allergen Reactions  . Penicillins     REACTION: Yeast Infections    Family History  Problem Relation Age of Onset  . Hypertension Father   . Hypertension Sister   . Arthritis    . Hyperlipidemia    . Aneurysm    . Colon cancer  Neg Hx   . Esophageal cancer Neg Hx   . Rectal cancer Neg Hx   . Stomach cancer Neg Hx     BP 122/64   Pulse 77   Ht 5\' 8"  (1.727 m)   Wt 148 lb (67.1 kg)   SpO2 97%   BMI 22.50 kg/m   Review of Systems Denies cramps and numbness.     Objective:   Physical Exam VITAL SIGNS:  See vs page.   GENERAL: no distress.  Gait: normal and steady.   Lab Results  Component Value Date   PTH 10 (L) 02/19/2016   CALCIUM 9.2 05/30/2016   CAION 0.84 (L) 08/06/2006   PHOS 5.4 (H) 10/17/2015       Assessment & Plan:  Hypoparathyroidism: well-controlled.  Reduce rocaltrol to 0.25/d.  Please continue the same natpara.  Recheck labs in 2 weeks.

## 2016-06-11 ENCOUNTER — Encounter: Payer: Self-pay | Admitting: Endocrinology

## 2016-06-19 ENCOUNTER — Other Ambulatory Visit: Payer: BLUE CROSS/BLUE SHIELD

## 2016-06-19 ENCOUNTER — Other Ambulatory Visit (INDEPENDENT_AMBULATORY_CARE_PROVIDER_SITE_OTHER): Payer: BLUE CROSS/BLUE SHIELD

## 2016-06-19 DIAGNOSIS — E209 Hypoparathyroidism, unspecified: Secondary | ICD-10-CM

## 2016-06-19 LAB — BASIC METABOLIC PANEL
BUN: 13 mg/dL (ref 6–23)
CO2: 31 meq/L (ref 19–32)
CREATININE: 0.81 mg/dL (ref 0.40–1.20)
Calcium: 9.3 mg/dL (ref 8.4–10.5)
Chloride: 102 mEq/L (ref 96–112)
GFR: 92.34 mL/min (ref >60.00)
Glucose, Bld: 99 mg/dL (ref 70–99)
POTASSIUM: 3.7 meq/L (ref 3.5–5.1)
Sodium: 139 mEq/L (ref 135–145)

## 2016-07-22 ENCOUNTER — Telehealth: Payer: Self-pay | Admitting: Family Medicine

## 2016-07-22 ENCOUNTER — Other Ambulatory Visit: Payer: Self-pay | Admitting: Family Medicine

## 2016-07-22 NOTE — Telephone Encounter (Signed)
Returned call to patient, scheduled appt for cholesterol followup on 08/07/16.

## 2016-07-22 NOTE — Telephone Encounter (Signed)
Patient requesting refill of simvastatin (ZOCOR) 40 MG tablet.    Pharmacy:   [facility], [place]. [phone] (Phone) [phone] (Fax)   Patient was instructed to schedule cpe 6 months after last ov in May.  However, she did not.  I offered to schedule an appt, since she is overdue to be seen.  Patient says she needs me to check with pcp to see if she even needs to be seen since she has been seeing Dr. Loanne Drilling every 2-3 months and she has no concerns at this time.  She doesn't understand why she needs an appt scheduled.

## 2016-07-22 NOTE — Telephone Encounter (Signed)
Yes, pt needs to see Dr. Birdie Riddle every 6 months due to her cholesterol. Pt has not has her lipids checked in 8 months (this needs to be done every 6 months) if she cannot get in soon for a CPE she at least needs a Cholesterol follow up. I can fill for #30 to allow her time to get in if needed.

## 2016-07-23 ENCOUNTER — Other Ambulatory Visit: Payer: Self-pay | Admitting: Family Medicine

## 2016-07-23 ENCOUNTER — Telehealth: Payer: Self-pay | Admitting: Family Medicine

## 2016-07-23 MED ORDER — SIMVASTATIN 40 MG PO TABS: 40.0000 mg | ORAL_TABLET | Freq: Every day | ORAL | 0 refills | Status: DC

## 2016-07-23 NOTE — Telephone Encounter (Signed)
Pt states that pharmacy did not receive Rx for simvastatin and asking [place] to resend it.

## 2016-07-23 NOTE — Telephone Encounter (Signed)
Resent

## 2016-08-07 ENCOUNTER — Ambulatory Visit: Payer: BLUE CROSS/BLUE SHIELD | Admitting: Family Medicine

## 2016-08-08 ENCOUNTER — Encounter: Payer: Self-pay | Admitting: Family Medicine

## 2016-08-11 ENCOUNTER — Ambulatory Visit: Payer: BLUE CROSS/BLUE SHIELD | Admitting: Family Medicine

## 2016-08-12 ENCOUNTER — Encounter: Payer: Self-pay | Admitting: Endocrinology

## 2016-08-13 ENCOUNTER — Encounter: Payer: Self-pay | Admitting: Family Medicine

## 2016-08-15 ENCOUNTER — Encounter: Payer: Self-pay | Admitting: Family Medicine

## 2016-08-15 ENCOUNTER — Ambulatory Visit (INDEPENDENT_AMBULATORY_CARE_PROVIDER_SITE_OTHER): Payer: BLUE CROSS/BLUE SHIELD | Admitting: Endocrinology

## 2016-08-15 ENCOUNTER — Encounter: Payer: Self-pay | Admitting: Endocrinology

## 2016-08-15 ENCOUNTER — Ambulatory Visit (INDEPENDENT_AMBULATORY_CARE_PROVIDER_SITE_OTHER): Payer: BLUE CROSS/BLUE SHIELD | Admitting: Family Medicine

## 2016-08-15 VITALS — BP 118/72 | HR 64 | Temp 98.4°F | Resp 16 | Ht 68.0 in | Wt 142.0 lb

## 2016-08-15 VITALS — BP 110/62 | HR 87 | Ht 68.0 in | Wt 142.0 lb

## 2016-08-15 DIAGNOSIS — E209 Hypoparathyroidism, unspecified: Secondary | ICD-10-CM | POA: Diagnosis not present

## 2016-08-15 DIAGNOSIS — E890 Postprocedural hypothyroidism: Secondary | ICD-10-CM

## 2016-08-15 DIAGNOSIS — E785 Hyperlipidemia, unspecified: Secondary | ICD-10-CM | POA: Diagnosis not present

## 2016-08-15 LAB — LIPID PANEL
Cholesterol: 218 mg/dL — ABNORMAL HIGH (ref 0–200)
HDL: 97.9 mg/dL (ref >39.00)
LDL CALC: 110 mg/dL — AB (ref 0–99)
NONHDL: 120.42
Total CHOL/HDL Ratio: 2
Triglycerides: 54 mg/dL (ref 0.0–149.0)
VLDL: 10.8 mg/dL (ref 0.0–40.0)

## 2016-08-15 LAB — BASIC METABOLIC PANEL
BUN: 16 mg/dL (ref 6–23)
CHLORIDE: 106 meq/L (ref 96–112)
CO2: 29 mEq/L (ref 19–32)
Calcium: 9.2 mg/dL (ref 8.4–10.5)
Creatinine, Ser: 0.87 mg/dL (ref 0.40–1.20)
GFR: 84.99 mL/min (ref >60.00)
Glucose, Bld: 99 mg/dL (ref 70–99)
POTASSIUM: 4.3 meq/L (ref 3.5–5.1)
SODIUM: 140 meq/L (ref 135–145)

## 2016-08-15 LAB — MAGNESIUM: MAGNESIUM: 1.7 mg/dL (ref 1.5–2.5)

## 2016-08-15 LAB — HEPATIC FUNCTION PANEL
ALBUMIN: 4.1 g/dL (ref 3.5–5.2)
ALT: 45 U/L — ABNORMAL HIGH (ref 0–35)
AST: 39 U/L — AB (ref 0–37)
Alkaline Phosphatase: 65 U/L (ref 39–117)
Bilirubin, Direct: 0.1 mg/dL (ref 0.0–0.3)
Total Bilirubin: 0.5 mg/dL (ref 0.2–1.2)
Total Protein: 7.5 g/dL (ref 6.0–8.3)

## 2016-08-15 LAB — PHOSPHORUS: PHOSPHORUS: 4.1 mg/dL (ref 2.3–4.6)

## 2016-08-15 LAB — TSH: TSH: 0.48 u[IU]/mL (ref 0.35–4.50)

## 2016-08-15 MED ORDER — DICLOFENAC SODIUM 1 % TD GEL: 2.0000 g | Freq: Four times a day (QID) | TRANSDERMAL | 5 refills | Status: DC

## 2016-08-15 NOTE — Progress Notes (Signed)
Subjective:    Patient ID: Tricia Woodward, female    DOB: 1956-02-15, 61 y.o.   MRN: [id number]  HPI Pt returns for f/u of postsurgical hypoparathyroidism (she had a thyroidectomy in the 1970's, in [place], for suspicious nodules, which proved on pathol to be benign; since then she has had hypoparathyroidism; in mid-2015, she was found to have low vitamin-D, and was rx'ed ergocalciferol; she also took forteo samples for a few mos in 2015, but had to d/c as it was declined by insurance; she is on chronic rocaltrol; [name] was added in Nov of 2017). She takes meds as rx'ed.  Main symptoms are muscle cramps and insomnia.   Past Medical History:  Diagnosis Date  . Allergy   . Arthritis   . Frequent falls   . Heart murmur   . Hemorrhoids    past hx   . Low blood sugar   . Thyroid disease     Past Surgical History:  Procedure Laterality Date  . ABDOMINAL HYSTERECTOMY     partial  . BREAST CYST EXCISION  1983  . BUNIONECTOMY    . DENTAL SURGERY    . THYROID SURGERY Bilateral 1970    Social History   Social History  . Marital status: Single    Spouse name: N/A  . Number of children: N/A  . Years of education: N/A   Occupational History  . Not on file.   Social History Main Topics  . Smoking status: Heavy Tobacco Smoker    Packs/day: 0.75    Types: Cigarettes    Start date: 12/16/1978  . Smokeless tobacco: Current User     Comment: e cigs at times  . Alcohol use 7.2 oz/week    6 Shots of liquor, 6 Standard drinks or equivalent per week     Comment: 1-2 drinks three times a week   . Drug use: No  . Sexual activity: Not on file   Other Topics Concern  . Not on file   Social History Narrative  . No narrative on file    Current Outpatient Prescriptions on File Prior to Visit  Medication Sig Dispense Refill  . aspirin 81 MG tablet Take 81 mg by mouth daily.     . calcitRIOL (ROCALTROL) 0.25 MCG capsule Take 1 capsule (0.25 mcg total) by mouth daily. 30 capsule 11  .  fish oil-omega-3 fatty acids 1000 MG capsule Take 1 g by mouth daily.     Marland Kitchen levothyroxine (SYNTHROID, LEVOTHROID) 100 MCG tablet TAKE ONE TABLET BY MOUTH ONCE DAILY BEFORE BREAKFAST 30 tablet 6  . Multiple Vitamin (MULTIVITAMIN) tablet Take 1 tablet by mouth daily.    . Parathyroid Hormone, Recomb, (NATPARA) 25 MCG CART Inject 25 mcg into the skin daily. 30 each 5  . simvastatin (ZOCOR) 40 MG tablet Take 1 tablet (40 mg total) by mouth at bedtime. 30 tablet 0  . traMADol (ULTRAM) 50 MG tablet Take 1 tablet (50 mg total) by mouth 2 (two) times daily as needed. 60 tablet 0  . vitamin B-12 (CYANOCOBALAMIN) 1000 MCG tablet Take 1,000 mcg by mouth daily.    . diclofenac sodium (VOLTAREN) 1 % GEL Apply 2 g topically 4 (four) times daily. 1 Tube 5   No current facility-administered medications on file prior to visit.     Allergies  Allergen Reactions  . Penicillins     REACTION: Yeast Infections    Family History  Problem Relation Age of Onset  . Hypertension Father   .  Hypertension Sister   . Arthritis    . Hyperlipidemia    . Aneurysm    . Colon cancer Neg Hx   . Esophageal cancer Neg Hx   . Rectal cancer Neg Hx   . Stomach cancer Neg Hx     BP 110/62   Pulse 87   Ht 5\' 8"  (1.727 m)   Wt 142 lb (64.4 kg)   SpO2 96%   BMI 21.59 kg/m    Review of Systems Denies numbness.     Objective:   Physical Exam VITAL SIGNS:  See vs page.  GENERAL: no distress.   MSK: normal strength throughout.   Lab Results  Component Value Date   PTH 10 (L) 02/19/2016   CALCIUM 9.2 08/15/2016   CAION 0.84 (L) 08/06/2006   PHOS 4.1 08/15/2016      Assessment & Plan:  Hypoparathyroidism: well-controlled.  Please continue the same medication.

## 2016-08-15 NOTE — Progress Notes (Signed)
   Subjective:    Patient ID: Tricia Woodward, female    DOB: 05-12-1956, 61 y.o.   MRN: [id number]  HPI Hyperlipidemia- chronic problem, on Simvastatin 40mg  daily.  Pt has lost 6 lbs since last visit.  Pt reports she is exercising regularly.  No CP, SOB, abd pain, N/V.  Hypothyroid- chronic problem, on Levothyroxine 125mcg daily.  + fatigue.  Denies constipation.  Denies changes to skin/hair/nails.   Review of Systems For ROS see HPI     Objective:   Physical Exam  Constitutional: She is oriented to person, place, and time. She appears well-developed and well-nourished. No distress.  HENT:  Head: Normocephalic and atraumatic.  Eyes: Conjunctivae and EOM are normal. Pupils are equal, round, and reactive to light.  Neck: Normal range of motion. Neck supple. No thyromegaly present.  Cardiovascular: Normal rate, regular rhythm, normal heart sounds and intact distal pulses.   No murmur heard. Pulmonary/Chest: Effort normal and breath sounds normal. No respiratory distress.  Abdominal: Soft. She exhibits no distension. There is no tenderness.  Musculoskeletal: She exhibits no edema.  Lymphadenopathy:    She has no cervical adenopathy.  Neurological: She is alert and oriented to person, place, and time.  Skin: Skin is warm and dry.  Psychiatric: She has a normal mood and affect. Her behavior is normal.  Vitals reviewed.         Assessment & Plan:

## 2016-08-15 NOTE — Patient Instructions (Addendum)
blood tests are requested for you today.  We'll let you know about the results.   Please come back for a follow-up appointment in 3 months.   

## 2016-08-15 NOTE — Progress Notes (Signed)
Pre visit review using our clinic review tool, if applicable. No additional management support is needed unless otherwise documented below in the visit note. 

## 2016-08-15 NOTE — Assessment & Plan Note (Signed)
Chronic problem.  Tolerating statin w/o difficulty.  Check labs.  Adjust meds prn  

## 2016-08-15 NOTE — Patient Instructions (Signed)
Schedule your complete physical in 6 months We'll notify you of your lab results and make any changes if needed Continue to work on healthy diet and regular exercise- you look great! Try and quit smoking!  You can do it!! Call with any questions or concerns Happy [place]!!!

## 2016-08-15 NOTE — Assessment & Plan Note (Signed)
Chronic problem.  Pt continues to deal with fatigue.  Stressed need for smoking cessation, healthy diet, and regular exercise. Check labs.  Adjust meds prn

## 2016-08-21 ENCOUNTER — Ambulatory Visit: Payer: BLUE CROSS/BLUE SHIELD | Admitting: Endocrinology

## 2016-08-25 ENCOUNTER — Encounter: Payer: Self-pay | Admitting: General Practice

## 2016-08-25 ENCOUNTER — Other Ambulatory Visit: Payer: Self-pay | Admitting: Family Medicine

## 2016-08-25 NOTE — Telephone Encounter (Signed)
Last OV 08/15/16 Tramadol last filled 01/16/16 #60 with 0

## 2016-08-25 NOTE — Telephone Encounter (Signed)
Please ask pt what she takes this for so we can appropriately document inline w/ the controlled substance prescribing changes

## 2016-08-25 NOTE — Telephone Encounter (Signed)
Message sent to pt to verify the need for tramadol.

## 2016-08-27 ENCOUNTER — Other Ambulatory Visit: Payer: Self-pay | Admitting: Family Medicine

## 2016-08-28 NOTE — Telephone Encounter (Signed)
Medication filled to pharmacy as requested.   

## 2016-08-28 NOTE — Telephone Encounter (Signed)
Indication for chronic opioid: chronic back pain/disc disease Medication and dose: Tramadol 50mg  # pills per month: 60 Last UDS date:   Pain contract signed (Y/N): Y Date narcotic database last reviewed (include red flags): Reviewed 3/15- no red flags

## 2016-09-15 ENCOUNTER — Other Ambulatory Visit: Payer: Self-pay | Admitting: Family Medicine

## 2016-10-28 ENCOUNTER — Other Ambulatory Visit: Payer: Self-pay | Admitting: Family Medicine

## 2016-10-28 MED ORDER — TRAMADOL HCL 50 MG PO TABS: 50.0000 mg | ORAL_TABLET | Freq: Two times a day (BID) | ORAL | 0 refills | Status: DC | PRN

## 2016-10-28 NOTE — Telephone Encounter (Signed)
Last OV 08/15/16 Tramadol last filled 08/28/16 #60 with 0

## 2016-10-31 ENCOUNTER — Other Ambulatory Visit: Payer: Self-pay | Admitting: Family Medicine

## 2016-11-18 ENCOUNTER — Ambulatory Visit (INDEPENDENT_AMBULATORY_CARE_PROVIDER_SITE_OTHER): Payer: BLUE CROSS/BLUE SHIELD | Admitting: Endocrinology

## 2016-11-18 VITALS — BP 104/72 | HR 78 | Ht 68.0 in | Wt 143.8 lb

## 2016-11-18 DIAGNOSIS — E209 Hypoparathyroidism, unspecified: Secondary | ICD-10-CM | POA: Diagnosis not present

## 2016-11-18 LAB — BASIC METABOLIC PANEL
BUN: 20 mg/dL (ref 6–23)
CHLORIDE: 105 meq/L (ref 96–112)
CO2: 27 mEq/L (ref 19–32)
CREATININE: 0.85 mg/dL (ref 0.40–1.20)
Calcium: 9 mg/dL (ref 8.4–10.5)
GFR: 87.22 mL/min (ref >60.00)
GLUCOSE: 108 mg/dL — AB (ref 70–99)
Potassium: 4.2 mEq/L (ref 3.5–5.1)
Sodium: 140 mEq/L (ref 135–145)

## 2016-11-18 LAB — PHOSPHORUS: Phosphorus: 3.9 mg/dL (ref 2.3–4.6)

## 2016-11-18 LAB — VITAMIN D 25 HYDROXY (VIT D DEFICIENCY, FRACTURES): VITD: 36.59 ng/mL (ref 30.00–100.00)

## 2016-11-18 NOTE — Progress Notes (Signed)
Subjective:    Patient ID: Tricia Woodward, female    DOB: June 28, 1955, 61 y.o.   MRN: [id number]  HPI Pt returns for f/u of postsurgical hypoparathyroidism (she had a thyroidectomy in the 1970's, in [place], for suspicious nodules, which proved on pathol to be benign; since then she has had hypoparathyroidism; in mid-2015, she was found to have low vitamin-D, and was rx'ed ergocalciferol; she also took forteo samples for a few mos in 2015, but had to d/c as it was declined by insurance; she is on chronic rocaltrol; [name] was added in Nov of 2017). She takes meds as rx'ed.  pt denies muscle cramps.  Main symptom is back and leg pain.   Past Medical History:  Diagnosis Date  . Allergy   . Arthritis   . Frequent falls   . Heart murmur   . Hemorrhoids    past hx   . Low blood sugar   . Thyroid disease     Past Surgical History:  Procedure Laterality Date  . ABDOMINAL HYSTERECTOMY     partial  . BREAST CYST EXCISION  1983  . BUNIONECTOMY    . DENTAL SURGERY    . THYROID SURGERY Bilateral 1970    Social History   Social History  . Marital status: Single    Spouse name: N/A  . Number of children: N/A  . Years of education: N/A   Occupational History  . Not on file.   Social History Main Topics  . Smoking status: Heavy Tobacco Smoker    Packs/day: 0.75    Types: Cigarettes    Start date: 12/16/1978  . Smokeless tobacco: Current User     Comment: e cigs at times  . Alcohol use 7.2 oz/week    6 Shots of liquor, 6 Standard drinks or equivalent per week     Comment: 1-2 drinks three times a week   . Drug use: No  . Sexual activity: Not on file   Other Topics Concern  . Not on file   Social History Narrative  . No narrative on file    Current Outpatient Prescriptions on File Prior to Visit  Medication Sig Dispense Refill  . aspirin 81 MG tablet Take 81 mg by mouth daily.     . calcitRIOL (ROCALTROL) 0.25 MCG capsule Take 1 capsule (0.25 mcg total) by mouth daily. 30  capsule 11  . diclofenac sodium (VOLTAREN) 1 % GEL Apply 2 g topically 4 (four) times daily. 1 Tube 5  . fish oil-omega-3 fatty acids 1000 MG capsule Take 1 g by mouth daily.     Marland Kitchen levothyroxine (SYNTHROID, LEVOTHROID) 100 MCG tablet TAKE ONE TABLET BY MOUTH ONCE DAILY BEFORE BREAKFAST 30 tablet 6  . Multiple Vitamin (MULTIVITAMIN) tablet Take 1 tablet by mouth daily.    . Parathyroid Hormone, Recomb, (NATPARA) 25 MCG CART Inject 25 mcg into the skin daily. 30 each 5  . simvastatin (ZOCOR) 40 MG tablet TAKE ONE TABLET BY MOUTH AT BEDTIME 90 tablet 1  . traMADol (ULTRAM) 50 MG tablet Take 1 tablet (50 mg total) by mouth 2 (two) times daily as needed. 60 tablet 0  . vitamin B-12 (CYANOCOBALAMIN) 1000 MCG tablet Take 1,000 mcg by mouth daily.     No current facility-administered medications on file prior to visit.     Allergies  Allergen Reactions  . Penicillins     REACTION: Yeast Infections    Family History  Problem Relation Age of Onset  . Hypertension  Father   . Hypertension Sister   . Arthritis Unknown   . Hyperlipidemia Unknown   . Aneurysm Unknown   . Colon cancer Neg Hx   . Esophageal cancer Neg Hx   . Rectal cancer Neg Hx   . Stomach cancer Neg Hx     BP 104/72 (BP Location: Left Arm, Patient Position: Sitting, Cuff Size: Normal)   Pulse 78   Ht 5\' 8"  (1.727 m)   Wt 143 lb 12.8 oz (65.2 kg)   SpO2 97%   BMI 21.86 kg/m   Review of Systems Denies numbness.      Objective:   Physical Exam VITAL SIGNS:  See vs page.  GENERAL: no distress.   MSK: normal strength throughout.  Gait: normal and steady.   Lab Results  Component Value Date   PTH 10 (L) 02/19/2016   CALCIUM 9.0 11/18/2016   CAION 0.84 (L) 08/06/2006   PHOS 3.9 11/18/2016       Assessment & Plan:  Hyperparathyroidism: well-controlled. Please continue the same medications. MSK pain.  Not thyroid-related

## 2016-11-18 NOTE — Patient Instructions (Addendum)
blood tests are requested for you today.  We'll let you know about the results. Please come back for a follow-up appointment in 4 months.     

## 2016-11-20 ENCOUNTER — Encounter: Payer: Self-pay | Admitting: Endocrinology

## 2016-11-27 ENCOUNTER — Other Ambulatory Visit: Payer: Self-pay

## 2016-11-27 MED ORDER — CALCITRIOL 0.25 MCG PO CAPS: 0.2500 ug | ORAL_CAPSULE | Freq: Every day | ORAL | 11 refills | Status: DC

## 2017-02-19 ENCOUNTER — Telehealth: Payer: Self-pay | Admitting: *Deleted

## 2017-02-19 ENCOUNTER — Encounter: Payer: Self-pay | Admitting: Family Medicine

## 2017-02-19 ENCOUNTER — Ambulatory Visit (INDEPENDENT_AMBULATORY_CARE_PROVIDER_SITE_OTHER): Payer: BLUE CROSS/BLUE SHIELD | Admitting: Family Medicine

## 2017-02-19 VITALS — BP 122/82 | HR 76 | Temp 98.1°F | Resp 16 | Ht 68.0 in | Wt 140.2 lb

## 2017-02-19 DIAGNOSIS — Z1231 Encounter for screening mammogram for malignant neoplasm of breast: Secondary | ICD-10-CM

## 2017-02-19 DIAGNOSIS — Z0000 Encounter for general adult medical examination without abnormal findings: Secondary | ICD-10-CM

## 2017-02-19 DIAGNOSIS — N3281 Overactive bladder: Secondary | ICD-10-CM

## 2017-02-19 DIAGNOSIS — E785 Hyperlipidemia, unspecified: Secondary | ICD-10-CM | POA: Diagnosis not present

## 2017-02-19 DIAGNOSIS — Z23 Encounter for immunization: Secondary | ICD-10-CM

## 2017-02-19 LAB — HEPATIC FUNCTION PANEL
ALT: 47 U/L — AB (ref 0–35)
AST: 42 U/L — AB (ref 0–37)
Albumin: 4.1 g/dL (ref 3.5–5.2)
Alkaline Phosphatase: 98 U/L (ref 39–117)
BILIRUBIN DIRECT: 0.1 mg/dL (ref 0.0–0.3)
BILIRUBIN TOTAL: 0.6 mg/dL (ref 0.2–1.2)
Total Protein: 7.6 g/dL (ref 6.0–8.3)

## 2017-02-19 LAB — CBC WITH DIFFERENTIAL/PLATELET
Basophils Absolute: 0.1 10*3/uL (ref 0.0–0.1)
Basophils Relative: 1.2 % (ref 0.0–3.0)
EOS PCT: 2.4 % (ref 0.0–5.0)
Eosinophils Absolute: 0.1 10*3/uL (ref 0.0–0.7)
HCT: 38.1 % (ref 36.0–46.0)
Hemoglobin: 12.6 g/dL (ref 12.0–15.0)
LYMPHS PCT: 47.3 % — AB (ref 12.0–46.0)
Lymphs Abs: 2.1 10*3/uL (ref 0.7–4.0)
MCHC: 33.1 g/dL (ref 30.0–36.0)
MCV: 93.9 fl (ref 78.0–100.0)
MONO ABS: 0.6 10*3/uL (ref 0.1–1.0)
Monocytes Relative: 13.3 % — ABNORMAL HIGH (ref 3.0–12.0)
NEUTROS ABS: 1.6 10*3/uL (ref 1.4–7.7)
Neutrophils Relative %: 35.8 % — ABNORMAL LOW (ref 43.0–77.0)
Platelets: 296 10*3/uL (ref 150.0–400.0)
RBC: 4.06 Mil/uL (ref 3.87–5.11)
RDW: 14.4 % (ref 11.5–15.5)
WBC: 4.4 10*3/uL (ref 4.0–10.5)

## 2017-02-19 LAB — LIPID PANEL
CHOL/HDL RATIO: 2
Cholesterol: 223 mg/dL — ABNORMAL HIGH (ref 0–200)
HDL: 93 mg/dL (ref >39.00)
LDL Cholesterol: 117 mg/dL — ABNORMAL HIGH (ref 0–99)
NONHDL: 129.59
Triglycerides: 63 mg/dL (ref 0.0–149.0)
VLDL: 12.6 mg/dL (ref 0.0–40.0)

## 2017-02-19 LAB — BASIC METABOLIC PANEL
BUN: 16 mg/dL (ref 6–23)
CALCIUM: 9.5 mg/dL (ref 8.4–10.5)
CHLORIDE: 103 meq/L (ref 96–112)
CO2: 31 meq/L (ref 19–32)
CREATININE: 0.81 mg/dL (ref 0.40–1.20)
GFR: 92.14 mL/min (ref >60.00)
Glucose, Bld: 77 mg/dL (ref 70–99)
Potassium: 4.3 mEq/L (ref 3.5–5.1)
SODIUM: 139 meq/L (ref 135–145)

## 2017-02-19 LAB — TSH: TSH: 0.95 u[IU]/mL (ref 0.35–4.50)

## 2017-02-19 MED ORDER — MIRABEGRON ER 25 MG PO TB24: 25.0000 mg | ORAL_TABLET | Freq: Every day | ORAL | 6 refills | Status: DC

## 2017-02-19 NOTE — Telephone Encounter (Signed)
Please explain to pt and ask if she would be ok trying Detrol LA in order for insurance to consider the Myrbetriq.

## 2017-02-19 NOTE — Telephone Encounter (Signed)
Patient is unsure of taking another medication.  She wants to know if a referral to urology would be okay first then trying medication if needed.    Routed to provider to advise.

## 2017-02-19 NOTE — Telephone Encounter (Signed)
Ok to refer to urology for OAB

## 2017-02-19 NOTE — Assessment & Plan Note (Signed)
Ongoing issue for pt but it is now interfering w/ day to day activities.  After much discussion and hesitation, will start Myrbetriq.  Pt expressed understanding and is in agreement w/ plan.

## 2017-02-19 NOTE — Assessment & Plan Note (Signed)
Chronic problem.  Tolerating statin w/o difficulty.  Check labs.  Adjust meds prn  

## 2017-02-19 NOTE — Addendum Note (Signed)
Addended by: Davis Gourd on: 02/19/2017 09:47 AM   Modules accepted: Orders

## 2017-02-19 NOTE — Assessment & Plan Note (Signed)
Pt's PE WNL.  UTD on colonoscopy, no need for pap.  Tdap updated today.  Mammo ordered.  Encouraged smoking cessatoin.  Check labs.  Anticipatory guidance provided.

## 2017-02-19 NOTE — Patient Instructions (Addendum)
Follow up in 6 months to recheck cholesterol We'll notify you of your lab results and make any changes if needed We'll call you with your mammogram appt Start the Myrbetriq once daily to improve urinary frequency and urgency QUIT SMOKING!!!  You can do it! Call with any questions or concerns Happy [place] [facility]!!!

## 2017-02-19 NOTE — Progress Notes (Signed)
   Subjective:    Patient ID: Tricia Woodward, female    DOB: [birth year], 61 y.o.   MRN: [id number]  HPI CPE- due for Tdap and mammo.  UTD on colonoscopy.  No need for pap due to hysterectomy.     Review of Systems Patient reports no vision/ hearing changes, adenopathy,fever, weight change,  persistant/recurrent hoarseness , swallowing issues, chest pain, palpitations, edema, persistant/recurrent cough, hemoptysis, dyspnea (rest/exertional/paroxysmal nocturnal), gastrointestinal bleeding (melena, rectal bleeding), abdominal pain, significant heartburn, bowel changes, Gyn symptoms (abnormal  bleeding, pain),  syncope, focal weakness, numbness & tingling, skin/hair/nail changes, abnormal bruising or bleeding, anxiety, or depression.   'i'm scatter brained'- pt reports she multi-tasks 'so much I just can't focus'.  Urinary frequency/OAB- pt reports she needs to urinate every 90 minutes and has a hard time making it to the bathroom.    Objective:   Physical Exam General Appearance:    Alert, cooperative, no distress, appears stated age  Head:    Normocephalic, without obvious abnormality, atraumatic  Eyes:    PERRL, conjunctiva/corneas clear, EOM's intact, fundi    benign, both eyes  Ears:    Normal TM's and external ear canals, both ears  Nose:   Nares normal, septum midline, mucosa normal, no drainage    or sinus tenderness  Throat:   Lips, mucosa, and tongue normal; teeth and gums normal  Neck:   Supple, symmetrical, trachea midline, no adenopathy;    Thyroid: no enlargement/tenderness/nodules  Back:     Symmetric, no curvature, ROM normal, no CVA tenderness  Lungs:     Clear to auscultation bilaterally, respirations unlabored  Chest Wall:    No tenderness or deformity   Heart:    Regular rate and rhythm, S1 and S2 normal, no murmur, rub   or gallop  Breast Exam:    Deferred to mammo  Abdomen:     Soft, non-tender, bowel sounds active all four quadrants,    no masses, no organomegaly   Genitalia:    Deferred  Rectal:    Extremities:   Extremities normal, atraumatic, no cyanosis or edema  Pulses:   2+ and symmetric all extremities  Skin:   Skin color, texture, turgor normal, no rashes or lesions  Lymph nodes:   Cervical, supraclavicular, and axillary nodes normal  Neurologic:   CNII-XII intact, normal strength, sensation and reflexes    throughout          Assessment & Plan:

## 2017-02-19 NOTE — Progress Notes (Signed)
Pre visit review using our clinic review tool, if applicable. No additional management support is needed unless otherwise documented below in the visit note. 

## 2017-02-19 NOTE — Telephone Encounter (Signed)
Received notice that insurance rejected Myrbetriq 25mg .   Alternatives: Oxybutynin Chloride (and ER) Tolterodine Tartrate (and ER) Trospium Chloride (and ER)   Routed to provider to advise.

## 2017-02-20 ENCOUNTER — Other Ambulatory Visit: Payer: Self-pay | Admitting: General Practice

## 2017-02-20 MED ORDER — TOLTERODINE TARTRATE ER 2 MG PO CP24: 2.0000 mg | ORAL_CAPSULE | Freq: Every day | ORAL | 3 refills | Status: DC

## 2017-02-23 ENCOUNTER — Ambulatory Visit (HOSPITAL_BASED_OUTPATIENT_CLINIC_OR_DEPARTMENT_OTHER)
Admission: RE | Admit: 2017-02-23 | Discharge: 2017-02-23 | Disposition: A | Discharge status: Home / Self Care | Payer: BLUE CROSS/BLUE SHIELD | Source: Ambulatory Visit | Attending: Family Medicine | Admitting: Family Medicine

## 2017-02-23 ENCOUNTER — Encounter (HOSPITAL_BASED_OUTPATIENT_CLINIC_OR_DEPARTMENT_OTHER): Payer: Self-pay

## 2017-02-23 DIAGNOSIS — Z1231 Encounter for screening mammogram for malignant neoplasm of breast: Secondary | ICD-10-CM | POA: Insufficient documentation

## 2017-02-24 ENCOUNTER — Encounter: Payer: Self-pay | Admitting: *Deleted

## 2017-02-24 NOTE — Telephone Encounter (Signed)
Patient had reached out and requested medication.   When we spoke previously  She was wanting to do one or the other (referral or medication). I have sent message to patient asking if she wants to continue with referral since she has started a new medication.

## 2017-03-03 ENCOUNTER — Encounter: Payer: Self-pay | Admitting: Endocrinology

## 2017-03-05 ENCOUNTER — Other Ambulatory Visit: Payer: Self-pay

## 2017-03-05 MED ORDER — PARATHYROID HORMONE (RECOMB) 25 MCG ~~LOC~~ CART: 25.0000 ug | CARTRIDGE | Freq: Every day | SUBCUTANEOUS | 5 refills | Status: DC

## 2017-03-10 ENCOUNTER — Telehealth: Payer: Self-pay | Admitting: Endocrinology

## 2017-03-10 ENCOUNTER — Other Ambulatory Visit: Payer: Self-pay

## 2017-03-10 MED ORDER — PARATHYROID HORMONE (RECOMB) 25 MCG ~~LOC~~ CART: 25.0000 ug | CARTRIDGE | Freq: Every day | SUBCUTANEOUS | 5 refills | Status: DC

## 2017-03-10 NOTE — Telephone Encounter (Signed)
MEDICATION: Parathyroid Hormone, Recomb, (NATPARA) 25 MCG CART  PHARMACY:  [facility] [phone]  IS THIS A 90 DAY SUPPLY : N  IS PATIENT OUT OF MEDICATION: N (0 refills left)  IF NOT; HOW MUCH IS LEFT: enough through 03/16/17  LAST APPOINTMENT DATE: 11/18/16  NEXT APPOINTMENT DATE: 03/20/17  OTHER COMMENTS:    **Let patient know to contact pharmacy at the end of the day to make sure medication is ready. **  ** Please notify patient to allow 48-72 hours to process**  **Encourage patient to contact the pharmacy for refills or they can request refills through [facility]**

## 2017-03-10 NOTE — Telephone Encounter (Signed)
Called patient because I was unsure of which [facility] but found it & sent in prescription.

## 2017-03-12 ENCOUNTER — Telehealth: Payer: Self-pay

## 2017-03-12 NOTE — Telephone Encounter (Signed)
Called patient to make sure I faxed her natpara prescription to correct pharmacy. Called [facility] & got correct fax number.

## 2017-03-13 ENCOUNTER — Telehealth: Payer: Self-pay | Admitting: Endocrinology

## 2017-03-13 NOTE — Telephone Encounter (Signed)
Pharmacy needs a rx for [employer] Device. Please call to give the verbal order.

## 2017-03-16 ENCOUNTER — Other Ambulatory Visit: Payer: Self-pay

## 2017-03-16 ENCOUNTER — Telehealth: Payer: Self-pay | Admitting: Endocrinology

## 2017-03-16 MED ORDER — UNABLE TO FIND: 0 refills | Status: DC

## 2017-03-16 NOTE — Telephone Encounter (Signed)
I sent rx

## 2017-03-16 NOTE — Telephone Encounter (Signed)
Pharmacy needs a refill on the [place]. Please call [phone] to give a verbal order or fax the rx to the fax: [phone]. The patient is overdue for the medication.

## 2017-03-20 ENCOUNTER — Encounter: Payer: Self-pay | Admitting: Endocrinology

## 2017-03-20 ENCOUNTER — Ambulatory Visit (INDEPENDENT_AMBULATORY_CARE_PROVIDER_SITE_OTHER): Payer: BLUE CROSS/BLUE SHIELD | Admitting: Endocrinology

## 2017-03-20 VITALS — BP 122/74 | HR 96 | Wt 140.4 lb

## 2017-03-20 DIAGNOSIS — E209 Hypoparathyroidism, unspecified: Secondary | ICD-10-CM | POA: Diagnosis not present

## 2017-03-20 LAB — VITAMIN D 25 HYDROXY (VIT D DEFICIENCY, FRACTURES): VITD: 49.86 ng/mL (ref 30.00–100.00)

## 2017-03-20 LAB — BASIC METABOLIC PANEL
BUN: 18 mg/dL (ref 6–23)
CALCIUM: 9.8 mg/dL (ref 8.4–10.5)
CHLORIDE: 100 meq/L (ref 96–112)
CO2: 30 mEq/L (ref 19–32)
CREATININE: 0.93 mg/dL (ref 0.40–1.20)
GFR: 78.54 mL/min (ref >60.00)
Glucose, Bld: 76 mg/dL (ref 70–99)
Potassium: 4.3 mEq/L (ref 3.5–5.1)
Sodium: 136 mEq/L (ref 135–145)

## 2017-03-20 NOTE — Patient Instructions (Signed)
blood tests are requested for you today.  We'll let you know about the results.   Please come back for a follow-up appointment in 6 months.  

## 2017-03-20 NOTE — Progress Notes (Signed)
Subjective:    Patient ID: Tricia Woodward, female    DOB: 1955/09/28, 61 y.o.   MRN: [id number]  HPI Pt returns for f/u of postsurgical hypoparathyroidism (she had a thyroidectomy in the 1970's, in [place], for suspicious nodules, which proved on pathol to be benign; since then she has had hypoparathyroidism; in mid-2015, she was found to have low vitamin-D, and was rx'ed ergocalciferol; she also took forteo samples for a few mos in 2015, but had to d/c as it was declined by insurance; she is on chronic rocaltrol; [name] was added in Nov of 2017). She denies cramps.  She takes vit-D, 1000 units/day Past Medical History:  Diagnosis Date  . Allergy   . Arthritis   . Frequent falls   . Heart murmur   . Hemorrhoids    past hx   . Low blood sugar   . Thyroid disease     Past Surgical History:  Procedure Laterality Date  . ABDOMINAL HYSTERECTOMY     partial  . BREAST CYST EXCISION Bilateral 1983  . BUNIONECTOMY    . DENTAL SURGERY    . THYROID SURGERY Bilateral 1970    Social History   Social History  . Marital status: Single    Spouse name: N/A  . Number of children: N/A  . Years of education: N/A   Occupational History  . Not on file.   Social History Main Topics  . Smoking status: Heavy Tobacco Smoker    Packs/day: 0.75    Types: Cigarettes    Start date: 12/16/1978  . Smokeless tobacco: Current User     Comment: e cigs at times  . Alcohol use 7.2 oz/week    6 Shots of liquor, 6 Standard drinks or equivalent per week     Comment: 1-2 drinks three times a week   . Drug use: No  . Sexual activity: Not on file   Other Topics Concern  . Not on file   Social History Narrative  . No narrative on file    Current Outpatient Prescriptions on File Prior to Visit  Medication Sig Dispense Refill  . aspirin 81 MG tablet Take 81 mg by mouth daily.     . calcitRIOL (ROCALTROL) 0.25 MCG capsule Take 1 capsule (0.25 mcg total) by mouth daily. 30 capsule 11  . diclofenac  sodium (VOLTAREN) 1 % GEL Apply 2 g topically 4 (four) times daily. 1 Tube 5  . fish oil-omega-3 fatty acids 1000 MG capsule Take 1 g by mouth daily.     Marland Kitchen levothyroxine (SYNTHROID, LEVOTHROID) 100 MCG tablet TAKE ONE TABLET BY MOUTH ONCE DAILY BEFORE BREAKFAST 30 tablet 6  . Multiple Vitamin (MULTIVITAMIN) tablet Take 1 tablet by mouth daily.    . Parathyroid Hormone, Recomb, (NATPARA) 25 MCG CART Inject 25 mcg into the skin daily. 30 each 5  . simvastatin (ZOCOR) 40 MG tablet TAKE ONE TABLET BY MOUTH AT BEDTIME 90 tablet 1  . traMADol (ULTRAM) 50 MG tablet Take 1 tablet (50 mg total) by mouth 2 (two) times daily as needed. 60 tablet 0  . UNABLE TO FIND natpara mixing device 1 Device 0  . vitamin B-12 (CYANOCOBALAMIN) 1000 MCG tablet Take 1,000 mcg by mouth daily.    . Vitamin D, Cholecalciferol, 1000 units CAPS Take by mouth.    . mirabegron ER (MYRBETRIQ) 25 MG TB24 tablet Take 1 tablet (25 mg total) by mouth daily. (Patient not taking: Reported on 03/20/2017) 30 tablet 6  . tolterodine (  DETROL LA) 2 MG 24 hr capsule Take 1 capsule (2 mg total) by mouth daily. (Patient not taking: Reported on 03/20/2017) 30 capsule 3   No current facility-administered medications on file prior to visit.     Allergies  Allergen Reactions  . Penicillins     REACTION: Yeast Infections    Family History  Problem Relation Age of Onset  . Hypertension Father   . Hypertension Sister   . Arthritis Unknown   . Hyperlipidemia Unknown   . Aneurysm Unknown   . Colon cancer Neg Hx   . Esophageal cancer Neg Hx   . Rectal cancer Neg Hx   . Stomach cancer Neg Hx     BP 122/74   Pulse 96   Wt 140 lb 6.4 oz (63.7 kg)   SpO2 98%   BMI 21.35 kg/m   Review of Systems Denies numbness    Objective:   Physical Exam VITAL SIGNS:  See vs page GENERAL: no distress NECK: There is no palpable thyroid enlargement.  No thyroid nodule is palpable.  No palpable lymphadenopathy at the anterior neck.         Assessment & Plan:  Hypoparathyroidism: due for recheck Vit-D deficiency: due for recheck.   Patient Instructions  blood tests are requested for you today.  We'll let you know about the results.   Please come back for a follow-up appointment in 6 months.

## 2017-03-24 ENCOUNTER — Telehealth: Payer: Self-pay | Admitting: Family Medicine

## 2017-03-24 NOTE — Telephone Encounter (Signed)
Ok to send in Tricia Woodward as we did before and we can document that pt failed attempt at other, preferred medication

## 2017-03-24 NOTE — Telephone Encounter (Signed)
Patient requesting rx for mirabegron ER (MYRBETRIQ) 25 MG TB24 tablet.  States insurance originally denied this medication and requested she try another medication first.  Patient states she tried that medication but now needs rx for Myrbetriq called in.  Pharmacy:  [place], [address] [phone] (Phone) [phone] (Fax)

## 2017-03-25 NOTE — Telephone Encounter (Signed)
PA has been started through cover my meds.   If we get an approval I will call in medication to pharmacy for patient.    KEY: [id number]

## 2017-03-30 NOTE — Telephone Encounter (Signed)
Despite listing that patient had tried and failed an alternative, medication was denied.   Patient is aware and she is going to call her insurance and see if there is anything else that can be done to get this medication approved.  Patient will let me know if we need to do anything from here.   Routed to provider just as an [place]

## 2017-04-14 ENCOUNTER — Other Ambulatory Visit: Payer: Self-pay | Admitting: General Practice

## 2017-05-08 ENCOUNTER — Other Ambulatory Visit: Payer: Self-pay | Admitting: Family Medicine

## 2017-05-23 ENCOUNTER — Other Ambulatory Visit: Payer: Self-pay | Admitting: Family Medicine

## 2017-07-16 ENCOUNTER — Encounter: Payer: Self-pay | Admitting: Family Medicine

## 2017-07-24 ENCOUNTER — Telehealth: Payer: Self-pay | Admitting: Endocrinology

## 2017-07-24 NOTE — Telephone Encounter (Signed)
[facility] ph# [phone] needs script for Natpara Pen sent to them at fax# [phone] or you can verbally call ph# above

## 2017-07-27 ENCOUNTER — Other Ambulatory Visit: Payer: Self-pay

## 2017-07-27 ENCOUNTER — Other Ambulatory Visit: Payer: Self-pay | Admitting: Endocrinology

## 2017-07-27 MED ORDER — PARATHYROID HORMONE (RECOMB) 25 MCG ~~LOC~~ CART: 25.0000 ug | CARTRIDGE | Freq: Every day | SUBCUTANEOUS | 5 refills | Status: DC

## 2017-07-27 NOTE — Telephone Encounter (Signed)
sent 

## 2017-07-28 ENCOUNTER — Telehealth: Payer: Self-pay | Admitting: Endocrinology

## 2017-07-28 ENCOUNTER — Other Ambulatory Visit: Payer: Self-pay

## 2017-07-28 NOTE — Telephone Encounter (Signed)
Conisha from [employer] [employer] is calling on the behalf of this message. ph# [phone] needs script for Natpara Pen sent to them at fax# [phone] or you can verbally call ph# above

## 2017-07-28 NOTE — Telephone Encounter (Signed)
I have called [facility] & gave verbal for natpara cartridges, pens as well as mixer. Verbal was given for 90 day supply with 3 refills.

## 2017-07-31 ENCOUNTER — Encounter: Payer: Self-pay | Admitting: Family Medicine

## 2017-08-04 ENCOUNTER — Other Ambulatory Visit: Payer: Self-pay | Admitting: Endocrinology

## 2017-08-20 ENCOUNTER — Ambulatory Visit (INDEPENDENT_AMBULATORY_CARE_PROVIDER_SITE_OTHER): Payer: BLUE CROSS/BLUE SHIELD | Admitting: Family Medicine

## 2017-08-20 ENCOUNTER — Other Ambulatory Visit: Payer: Self-pay

## 2017-08-20 ENCOUNTER — Encounter: Payer: Self-pay | Admitting: Family Medicine

## 2017-08-20 VITALS — BP 120/80 | HR 76 | Temp 98.0°F | Resp 17 | Ht 68.0 in | Wt 144.2 lb

## 2017-08-20 DIAGNOSIS — E785 Hyperlipidemia, unspecified: Secondary | ICD-10-CM

## 2017-08-20 DIAGNOSIS — E890 Postprocedural hypothyroidism: Secondary | ICD-10-CM

## 2017-08-20 LAB — CBC WITH DIFFERENTIAL/PLATELET
BASOS ABS: 0.1 10*3/uL (ref 0.0–0.1)
Basophils Relative: 1.1 % (ref 0.0–3.0)
EOS ABS: 0.1 10*3/uL (ref 0.0–0.7)
Eosinophils Relative: 2.2 % (ref 0.0–5.0)
HEMATOCRIT: 40.3 % (ref 36.0–46.0)
HEMOGLOBIN: 13.3 g/dL (ref 12.0–15.0)
LYMPHS PCT: 38.9 % (ref 12.0–46.0)
Lymphs Abs: 2.7 10*3/uL (ref 0.7–4.0)
MCHC: 33.1 g/dL (ref 30.0–36.0)
MCV: 92.6 fl (ref 78.0–100.0)
Monocytes Absolute: 0.7 10*3/uL (ref 0.1–1.0)
Monocytes Relative: 10.2 % (ref 3.0–12.0)
Neutro Abs: 3.3 10*3/uL (ref 1.4–7.7)
Neutrophils Relative %: 47.6 % (ref 43.0–77.0)
Platelets: 324 10*3/uL (ref 150.0–400.0)
RBC: 4.35 Mil/uL (ref 3.87–5.11)
RDW: 14.7 % (ref 11.5–15.5)
WBC: 6.8 10*3/uL (ref 4.0–10.5)

## 2017-08-20 LAB — LIPID PANEL
CHOLESTEROL: 232 mg/dL — AB (ref 0–200)
HDL: 116.2 mg/dL (ref >39.00)
LDL CALC: 102 mg/dL — AB (ref 0–99)
NonHDL: 115.46
TRIGLYCERIDES: 67 mg/dL (ref 0.0–149.0)
Total CHOL/HDL Ratio: 2
VLDL: 13.4 mg/dL (ref 0.0–40.0)

## 2017-08-20 LAB — BASIC METABOLIC PANEL
BUN: 13 mg/dL (ref 6–23)
CO2: 28 meq/L (ref 19–32)
Calcium: 9.1 mg/dL (ref 8.4–10.5)
Chloride: 103 mEq/L (ref 96–112)
Creatinine, Ser: 0.77 mg/dL (ref 0.40–1.20)
GFR: 97.52 mL/min (ref >60.00)
GLUCOSE: 72 mg/dL (ref 70–99)
Potassium: 4.5 mEq/L (ref 3.5–5.1)
Sodium: 139 mEq/L (ref 135–145)

## 2017-08-20 LAB — HEPATIC FUNCTION PANEL
ALBUMIN: 3.9 g/dL (ref 3.5–5.2)
ALT: 49 U/L — ABNORMAL HIGH (ref 0–35)
AST: 44 U/L — ABNORMAL HIGH (ref 0–37)
Alkaline Phosphatase: 82 U/L (ref 39–117)
BILIRUBIN DIRECT: 0.1 mg/dL (ref 0.0–0.3)
TOTAL PROTEIN: 7.3 g/dL (ref 6.0–8.3)
Total Bilirubin: 0.5 mg/dL (ref 0.2–1.2)

## 2017-08-20 LAB — TSH: TSH: 1.89 u[IU]/mL (ref 0.35–4.50)

## 2017-08-20 NOTE — Progress Notes (Signed)
   Subjective:    Patient ID: Tricia Woodward, female    DOB: 12/31/1955, 62 y.o.   MRN: [id number]  HPI Hyperlipidemia- chronic problem on Simvastatin 40mg  daily.  Denies CP, SOB, abd pain, N/V.  Hypothyroid- chronic problem, on Levothyroxine 12mcg daily.  'I have been very thyroid-y for the last couple weeks'.  Pt reports increased fatigue, aching, depressed- 'I just couldn't move'.   Review of Systems For ROS see HPI     Objective:   Physical Exam  Constitutional: She is oriented to person, place, and time. She appears well-developed and well-nourished. No distress.  HENT:  Head: Normocephalic and atraumatic.  Eyes: Conjunctivae and EOM are normal. Pupils are equal, round, and reactive to light.  Neck: Normal range of motion. Neck supple. No thyromegaly present.  Cardiovascular: Normal rate, regular rhythm, normal heart sounds and intact distal pulses.  No murmur heard. Pulmonary/Chest: Effort normal and breath sounds normal. No respiratory distress.  Abdominal: Soft. She exhibits no distension. There is no tenderness.  Musculoskeletal: She exhibits no edema.  Lymphadenopathy:    She has no cervical adenopathy.  Neurological: She is alert and oriented to person, place, and time.  Skin: Skin is warm and dry.  Psychiatric: She has a normal mood and affect. Her behavior is normal.  Vitals reviewed.         Assessment & Plan:

## 2017-08-20 NOTE — Patient Instructions (Signed)
Schedule your complete physical with pap in 6 months We'll notify you of your lab results and make any changes if needed Continue to work on healthy diet and regular exercise- you look great! Call with any questions or concerns Happy Spring!!

## 2017-08-20 NOTE — Assessment & Plan Note (Signed)
Chronic problem.  Tolerating statin w/o difficulty.  Check labs.  Adjust meds prn  

## 2017-08-20 NOTE — Assessment & Plan Note (Signed)
Chronic problem.  Pt reports multiple symptoms recently but 'it's getting better'.  Check labs.  Adjust meds prn

## 2017-08-21 ENCOUNTER — Encounter: Payer: Self-pay | Admitting: General Practice

## 2017-08-25 ENCOUNTER — Telehealth: Payer: Self-pay | Admitting: General Practice

## 2017-08-25 MED ORDER — LEVOTHYROXINE SODIUM 50 MCG PO TABS: 50.0000 ug | ORAL_TABLET | Freq: Two times a day (BID) | ORAL | 3 refills | Status: DC

## 2017-08-25 NOTE — Addendum Note (Signed)
Addended by: Davis Gourd on: 08/25/2017 11:25 AM   Modules accepted: Orders

## 2017-08-25 NOTE — Telephone Encounter (Signed)
New rx sent to the pharmacy

## 2017-08-25 NOTE — Telephone Encounter (Signed)
Received a fax from pharmacy that advised levothyroxine is on back order. Pt does not want a new manufacturer. Could we send in 53mcg twice daily?

## 2017-08-25 NOTE — Telephone Encounter (Signed)
Yes- ok to prescribe 51mcg 2 tabs daily, #60, 3 refills

## 2017-09-15 ENCOUNTER — Encounter: Payer: Self-pay | Admitting: Endocrinology

## 2017-09-15 ENCOUNTER — Ambulatory Visit (INDEPENDENT_AMBULATORY_CARE_PROVIDER_SITE_OTHER): Payer: BLUE CROSS/BLUE SHIELD | Admitting: Endocrinology

## 2017-09-15 VITALS — BP 92/60 | HR 91 | Wt 143.2 lb

## 2017-09-15 DIAGNOSIS — E209 Hypoparathyroidism, unspecified: Secondary | ICD-10-CM | POA: Diagnosis not present

## 2017-09-15 MED ORDER — CALCITRIOL 0.25 MCG PO CAPS: 0.2500 ug | ORAL_CAPSULE | ORAL | 3 refills | Status: DC

## 2017-09-15 NOTE — Patient Instructions (Addendum)
Please resume the calcitriol at 1 pill, 3 times a week.   Please continue the same "Natpara."  Please come back for a follow-up appointment in 6 months.

## 2017-09-15 NOTE — Progress Notes (Signed)
Subjective:    Patient ID: Tricia Woodward, female    DOB: 30-Aug-1955, 62 y.o.   MRN: [id number]  HPI Pt returns for f/u of postsurgical hypoparathyroidism (she had a thyroidectomy in the 1970's, in [place], for suspicious nodules, which proved on pathol to be benign; since then she has had hypoparathyroidism; in mid-2015, she was found to have low vitamin-D, and was rx'ed ergocalciferol; she also took forteo samples for a few mos in 2015, but had to d/c as it was declined by insurance; she is on chronic rocaltrol; [name] was added in Nov of 2017).  pt states she feels well in general, except for fatigue.  She takes vit-D, 1000 units/day.  She stopped rocaltrol after last OV.  Pt says she misread the result message in 10/18, which said to d/c Ca++ supplement.   Past Medical History:  Diagnosis Date  . Allergy   . Arthritis   . Frequent falls   . Heart murmur   . Hemorrhoids    past hx   . Low blood sugar   . Thyroid disease     Past Surgical History:  Procedure Laterality Date  . ABDOMINAL HYSTERECTOMY     partial  . BREAST CYST EXCISION Bilateral 1983  . BUNIONECTOMY    . DENTAL SURGERY    . THYROID SURGERY Bilateral 1970    Social History   Socioeconomic History  . Marital status: Single    Spouse name: Not on file  . Number of children: Not on file  . Years of education: Not on file  . Highest education level: Not on file  Occupational History  . Not on file  Social Needs  . Financial resource strain: Not on file  . Food insecurity:    Worry: Not on file    Inability: Not on file  . Transportation needs:    Medical: Not on file    Non-medical: Not on file  Tobacco Use  . Smoking status: Heavy Tobacco Smoker    Packs/day: 0.75    Types: Cigarettes    Start date: 12/16/1978  . Smokeless tobacco: Current User  . Tobacco comment: e cigs at times  Substance and Sexual Activity  . Alcohol use: Yes    Alcohol/week: 7.2 oz    Types: 6 Shots of liquor, 6 Standard drinks  or equivalent per week    Comment: 1-2 drinks three times a week   . Drug use: No  . Sexual activity: Not on file  Lifestyle  . Physical activity:    Days per week: Not on file    Minutes per session: Not on file  . Stress: Not on file  Relationships  . Social connections:    Talks on phone: Not on file    Gets together: Not on file    Attends religious service: Not on file    Active member of club or organization: Not on file    Attends meetings of clubs or organizations: Not on file    Relationship status: Not on file  . Intimate partner violence:    Fear of current or ex partner: Not on file    Emotionally abused: Not on file    Physically abused: Not on file    Forced sexual activity: Not on file  Other Topics Concern  . Not on file  Social History Narrative  . Not on file    Current Outpatient Medications on File Prior to Visit  Medication Sig Dispense Refill  . aspirin  81 MG tablet Take 81 mg by mouth daily.     . diclofenac sodium (VOLTAREN) 1 % GEL Apply 2 g topically 4 (four) times daily. 1 Tube 5  . fish oil-omega-3 fatty acids 1000 MG capsule Take 2 g by mouth daily.     Marland Kitchen levothyroxine (SYNTHROID, LEVOTHROID) 50 MCG tablet Take 1 tablet (50 mcg total) by mouth 2 (two) times daily. 60 tablet 3  . Multiple Vitamin (MULTIVITAMIN) tablet Take 1 tablet by mouth daily.    Marland Kitchen NATPARA 25 MCG CART INJECT 25MCG SUBCUTANEOUSLY ONCE A DAY INTO THE THIGH (ALTERNATE THIGH EVERY DAY) 2 each PRN  . simvastatin (ZOCOR) 40 MG tablet TAKE 1 TABLET BY MOUTH AT BEDTIME (Patient not taking: Reported on 08/20/2017) 90 tablet 1  . traMADol (ULTRAM) 50 MG tablet Take 1 tablet (50 mg total) by mouth 2 (two) times daily as needed. 60 tablet 0  . UNABLE TO FIND natpara mixing device 1 Device 0  . vitamin B-12 (CYANOCOBALAMIN) 1000 MCG tablet Take 1,000 mcg by mouth daily.    . Vitamin D, Cholecalciferol, 1000 units CAPS Take by mouth. Pt takes 5000iu     No current facility-administered  medications on file prior to visit.     Allergies  Allergen Reactions  . Penicillins     REACTION: Yeast Infections    Family History  Problem Relation Age of Onset  . Hypertension Father   . Hypertension Sister   . Arthritis Unknown   . Hyperlipidemia Unknown   . Aneurysm Unknown   . Colon cancer Neg Hx   . Esophageal cancer Neg Hx   . Rectal cancer Neg Hx   . Stomach cancer Neg Hx     BP 92/60 (BP Location: Left Arm, Patient Position: Sitting, Cuff Size: Normal)   Pulse 91   Wt 143 lb 3.2 oz (65 kg)   SpO2 95%   BMI 21.77 kg/m    Review of Systems Denies muscle cramps.     Objective:   Physical Exam VITAL SIGNS:  See vs page GENERAL: no distress NECK: a healed scar is present.  I do not appreciate a nodule in the thyroid or elsewhere in the neck.    Lab Results  Component Value Date   PTH 10 (L) 02/19/2016   CALCIUM 9.1 08/20/2017   CAION 0.84 (L) 08/06/2006   PHOS 3.9 11/18/2016      Assessment & Plan:  Hypoparathyroidism, on rx. intermitt med taking: she needs to resume rocaltrol.   Patient Instructions  Please resume the calcitriol at 1 pill, 3 times a week.   Please continue the same "Natpara."  Please come back for a follow-up appointment in 6 months.

## 2017-11-18 ENCOUNTER — Other Ambulatory Visit: Payer: Self-pay

## 2017-11-18 ENCOUNTER — Telehealth: Payer: Self-pay | Admitting: Endocrinology

## 2017-11-18 NOTE — Telephone Encounter (Signed)
Pharmacy called need a prescription for a mixing device, for the NATPARA 25 MCG CART [[id number]]  Please call [facility] [phone]

## 2017-11-18 NOTE — Telephone Encounter (Signed)
I have given verbal orders for mixing device for Natpara.

## 2017-11-18 NOTE — Telephone Encounter (Signed)
I have called [facility] & have given them verbal orders for Natpara mixing device.

## 2017-11-18 NOTE — Telephone Encounter (Signed)
Platea 25 MCG CART    Patient needs this medications sent into the pharmacy She stated that they have sent over a request. Patient stated this should be sent to  [facility] pharmacy   Patient only has phone number  [phone]

## 2017-11-23 ENCOUNTER — Other Ambulatory Visit: Payer: Self-pay | Admitting: Family Medicine

## 2017-12-31 ENCOUNTER — Other Ambulatory Visit: Payer: Self-pay | Admitting: Endocrinology

## 2018-01-04 ENCOUNTER — Other Ambulatory Visit: Payer: Self-pay | Admitting: Family Medicine

## 2018-01-04 NOTE — Telephone Encounter (Signed)
Copied from [place]. Topic: Quick Communication - Rx Refill/Question >> Jan 04, 2018  1:08 PM Wynetta Emery, [place] C wrote: Medication: traMADol (ULTRAM) 50 MG tablet   Has the patient contacted their pharmacy? No  (Agent: If no, request that the patient contact the pharmacy for the refill.) (Agent: If yes, when and what did the pharmacy advise?)  Preferred Pharmacy (with phone number or street name): [place], [address] [place] [phone] (Phone) [phone] (Fax)      Agent: Please be advised that RX refills may take up to 3 business days. We ask that you follow-up with your pharmacy.

## 2018-01-05 MED ORDER — TRAMADOL HCL 50 MG PO TABS: 50.0000 mg | ORAL_TABLET | Freq: Two times a day (BID) | ORAL | 0 refills | Status: DC | PRN

## 2018-01-05 NOTE — Addendum Note (Signed)
Addended by: Billey Chang on: 01/05/2018 04:08 PM   Modules accepted: Orders

## 2018-01-05 NOTE — Telephone Encounter (Signed)
Tramadol 50 mg refill request  LOV 08/20/17 with Dr. Birdie Riddle  Last refill:  10/28/16  #60  0 refills  [place], [place]

## 2018-01-08 ENCOUNTER — Encounter: Payer: Self-pay | Admitting: Family Medicine

## 2018-01-15 ENCOUNTER — Encounter: Payer: Self-pay | Admitting: Family Medicine

## 2018-01-15 ENCOUNTER — Other Ambulatory Visit: Payer: Self-pay | Admitting: Family Medicine

## 2018-01-18 MED ORDER — TRAMADOL HCL 50 MG PO TABS: 50.0000 mg | ORAL_TABLET | Freq: Two times a day (BID) | ORAL | 0 refills | Status: DC | PRN

## 2018-02-22 ENCOUNTER — Encounter: Payer: Self-pay | Admitting: Endocrinology

## 2018-03-04 ENCOUNTER — Ambulatory Visit (INDEPENDENT_AMBULATORY_CARE_PROVIDER_SITE_OTHER): Payer: BLUE CROSS/BLUE SHIELD | Admitting: Family Medicine

## 2018-03-04 ENCOUNTER — Other Ambulatory Visit: Payer: Self-pay

## 2018-03-04 ENCOUNTER — Encounter: Payer: Self-pay | Admitting: Family Medicine

## 2018-03-04 VITALS — BP 101/62 | HR 60 | Temp 98.0°F | Resp 16 | Ht 68.0 in | Wt 141.5 lb

## 2018-03-04 DIAGNOSIS — Z0000 Encounter for general adult medical examination without abnormal findings: Secondary | ICD-10-CM | POA: Diagnosis not present

## 2018-03-04 DIAGNOSIS — E209 Hypoparathyroidism, unspecified: Secondary | ICD-10-CM | POA: Diagnosis not present

## 2018-03-04 DIAGNOSIS — E890 Postprocedural hypothyroidism: Secondary | ICD-10-CM | POA: Diagnosis not present

## 2018-03-04 DIAGNOSIS — E785 Hyperlipidemia, unspecified: Secondary | ICD-10-CM

## 2018-03-04 DIAGNOSIS — N3281 Overactive bladder: Secondary | ICD-10-CM

## 2018-03-04 LAB — CBC WITH DIFFERENTIAL/PLATELET
BASOS ABS: 0.1 10*3/uL (ref 0.0–0.1)
Basophils Relative: 1.3 % (ref 0.0–3.0)
EOS PCT: 2.6 % (ref 0.0–5.0)
Eosinophils Absolute: 0.1 10*3/uL (ref 0.0–0.7)
HCT: 41.9 % (ref 36.0–46.0)
HEMOGLOBIN: 14 g/dL (ref 12.0–15.0)
LYMPHS ABS: 2.3 10*3/uL (ref 0.7–4.0)
LYMPHS PCT: 49.6 % — AB (ref 12.0–46.0)
MCHC: 33.5 g/dL (ref 30.0–36.0)
MCV: 91.4 fl (ref 78.0–100.0)
MONOS PCT: 15.2 % — AB (ref 3.0–12.0)
Monocytes Absolute: 0.7 10*3/uL (ref 0.1–1.0)
NEUTROS PCT: 31.3 % — AB (ref 43.0–77.0)
Neutro Abs: 1.5 10*3/uL (ref 1.4–7.7)
Platelets: 315 10*3/uL (ref 150.0–400.0)
RBC: 4.59 Mil/uL (ref 3.87–5.11)
RDW: 14.5 % (ref 11.5–15.5)
WBC: 4.7 10*3/uL (ref 4.0–10.5)

## 2018-03-04 LAB — BASIC METABOLIC PANEL
BUN: 14 mg/dL (ref 6–23)
CALCIUM: 9.4 mg/dL (ref 8.4–10.5)
CO2: 29 mEq/L (ref 19–32)
CREATININE: 0.73 mg/dL (ref 0.40–1.20)
Chloride: 102 mEq/L (ref 96–112)
GFR: 103.53 mL/min (ref >60.00)
GLUCOSE: 90 mg/dL (ref 70–99)
Potassium: 4.7 mEq/L (ref 3.5–5.1)
Sodium: 140 mEq/L (ref 135–145)

## 2018-03-04 LAB — HEPATIC FUNCTION PANEL
ALBUMIN: 4.4 g/dL (ref 3.5–5.2)
ALK PHOS: 90 U/L (ref 39–117)
ALT: 49 U/L — ABNORMAL HIGH (ref 0–35)
AST: 46 U/L — ABNORMAL HIGH (ref 0–37)
Bilirubin, Direct: 0.1 mg/dL (ref 0.0–0.3)
TOTAL PROTEIN: 8.4 g/dL — AB (ref 6.0–8.3)
Total Bilirubin: 0.6 mg/dL (ref 0.2–1.2)

## 2018-03-04 LAB — VITAMIN D 25 HYDROXY (VIT D DEFICIENCY, FRACTURES): VITD: 84.95 ng/mL (ref 30.00–100.00)

## 2018-03-04 LAB — LIPID PANEL
CHOLESTEROL: 269 mg/dL — AB (ref 0–200)
HDL: 106.7 mg/dL (ref >39.00)
LDL CALC: 149 mg/dL — AB (ref 0–99)
NonHDL: 162.45
Total CHOL/HDL Ratio: 3
Triglycerides: 68 mg/dL (ref 0.0–149.0)
VLDL: 13.6 mg/dL (ref 0.0–40.0)

## 2018-03-04 LAB — TSH: TSH: 0.96 u[IU]/mL (ref 0.35–4.50)

## 2018-03-04 MED ORDER — DICLOFENAC SODIUM 1 % TD GEL: 2.0000 g | Freq: Four times a day (QID) | TRANSDERMAL | 5 refills | Status: DC

## 2018-03-04 NOTE — Assessment & Plan Note (Signed)
Pt's PE WNL.  UTD on colonoscopy.  Due for mammo- pt plans to schedule.  She declines flu shot and other immunizations today.  Check labs.  Encouraged smoking cessation.  Anticipatory guidance provided.

## 2018-03-04 NOTE — Assessment & Plan Note (Signed)
Chronic problem.  Tolerating statin w/o difficulty.  Check labs.  Adjust meds prn  

## 2018-03-04 NOTE — Progress Notes (Signed)
   Subjective:    Patient ID: Tricia Woodward, female    DOB: 1956-04-22, 62 y.o.   MRN: [id number]  HPI CPE- UTD on colonoscopy, no need for pap due to hysterectomy.  Due for mammo (pt plans to schedule).  Declines flu shot.   Review of Systems Patient reports no vision/ hearing changes, adenopathy,fever, weight change,  persistant/recurrent hoarseness , swallowing issues, chest pain, palpitations, edema, persistant/recurrent cough, hemoptysis, dyspnea (rest/exertional/paroxysmal nocturnal), gastrointestinal bleeding (melena, rectal bleeding), abdominal pain, significant heartburn, bowel changes, Gyn symptoms (abnormal  bleeding, pain),  syncope, focal weakness, memory loss, numbness & tingling, skin/hair/nail changes, abnormal bruising or bleeding, anxiety, or depression.   + OAB w/ nocturia up to 5x/night    Objective:   Physical Exam General Appearance:    Alert, cooperative, no distress, appears stated age  Head:    Normocephalic, without obvious abnormality, atraumatic  Eyes:    PERRL, conjunctiva/corneas clear, EOM's intact, fundi    benign, both eyes  Ears:    Hearing aides in place bilaterally  Nose:   Nares normal, septum midline, mucosa normal, no drainage    or sinus tenderness  Throat:   Lips, mucosa, and tongue normal; teeth and gums normal  Neck:   Supple, symmetrical, trachea midline, no adenopathy;    Thyroid: no enlargement/tenderness/nodules  Back:     Symmetric, no curvature, ROM normal, no CVA tenderness  Lungs:     Clear to auscultation bilaterally, respirations unlabored  Chest Wall:    No tenderness or deformity   Heart:    Regular rate and rhythm, S1 and S2 normal, no murmur, rub   or gallop  Breast Exam:    Deferred to mammo  Abdomen:     Soft, non-tender, bowel sounds active all four quadrants,    no masses, no organomegaly  Genitalia:    Deferred  Rectal:    Extremities:   Extremities normal, atraumatic, no cyanosis or edema  Pulses:   2+ and symmetric  all extremities  Skin:   Skin color, texture, turgor normal, no rashes or lesions  Lymph nodes:   Cervical, supraclavicular, and axillary nodes normal  Neurologic:   CNII-XII intact, normal strength, sensation and reflexes    throughout          Assessment & Plan:

## 2018-03-04 NOTE — Assessment & Plan Note (Signed)
Pt was not willing to take insurance recommended medications when this was last discussed.  She is now urinating every 45 minutes and waking up to 5x/night.  Will refer to urology for complete evaluation and tx

## 2018-03-04 NOTE — Assessment & Plan Note (Signed)
Check Vit D and PTH levels

## 2018-03-04 NOTE — Patient Instructions (Signed)
Follow up in 6 months to recheck cholesterol We'll notify you of your lab results and make any changes if needed SCHEDULE YOUR MAMMOGRAM!!! We'll call you with your Urology appt Stop smoking!  You can do it! Keep up the good work on healthy diet and regular exercise- you look great! Call with any questions or concerns Happy [place] Birthday!

## 2018-03-04 NOTE — Assessment & Plan Note (Signed)
Has appt w/ Dr Loanne Drilling upcoming.  Check TSH so lab is available for him to review.

## 2018-03-05 ENCOUNTER — Encounter: Payer: Self-pay | Admitting: Family Medicine

## 2018-03-05 ENCOUNTER — Other Ambulatory Visit: Payer: Self-pay | Admitting: General Practice

## 2018-03-05 LAB — PTH, INTACT AND CALCIUM
Calcium: 9.6 mg/dL (ref 8.6–10.4)
PTH: 107 pg/mL — ABNORMAL HIGH (ref 14–64)

## 2018-03-05 MED ORDER — ATORVASTATIN CALCIUM 40 MG PO TABS: 40.0000 mg | ORAL_TABLET | Freq: Every day | ORAL | 1 refills | Status: DC

## 2018-03-11 ENCOUNTER — Other Ambulatory Visit: Payer: Self-pay | Admitting: Family Medicine

## 2018-03-11 DIAGNOSIS — Z1231 Encounter for screening mammogram for malignant neoplasm of breast: Secondary | ICD-10-CM

## 2018-03-12 ENCOUNTER — Ambulatory Visit: Payer: BLUE CROSS/BLUE SHIELD | Admitting: Endocrinology

## 2018-03-12 ENCOUNTER — Encounter: Payer: Self-pay | Admitting: Endocrinology

## 2018-03-12 VITALS — BP 98/58 | HR 82 | Ht 68.0 in | Wt 142.2 lb

## 2018-03-12 DIAGNOSIS — E209 Hypoparathyroidism, unspecified: Secondary | ICD-10-CM | POA: Diagnosis not present

## 2018-03-12 MED ORDER — CHOLECALCIFEROL 100 MCG (4000 UT) PO CAPS: 1.0000 | ORAL_CAPSULE | Freq: Every day | ORAL | 11 refills | Status: DC

## 2018-03-12 NOTE — Patient Instructions (Addendum)
Please reduce the vitamin-D to 4000 units per day.   You can stop taking the natpara.  At your recent calcium level, we don't need to increase the calcitriol now. Please come back to the lab to redo the blood tests in 5-7 days.   Please come back for a follow-up appointment in 2 months.

## 2018-03-12 NOTE — Progress Notes (Signed)
Subjective:    Patient ID: Tricia Woodward, female    DOB: 1955/09/30, 62 y.o.   MRN: [id number]  HPI Pt returns for f/u of postsurgical hypoparathyroidism (she had a thyroidectomy in the 1970's, in [place], for suspicious nodules, which proved on pathol to be benign; since then she has had hypoparathyroidism; in mid-2015, she was found to have low vitamin-D, and was rx'ed ergocalciferol; she also took forteo samples for a few mos in 2015, but had to d/c as it was declined by insurance; she is on chronic rocaltrol; [name] was added in Nov of 2017).  she takes rocaltrol TIW.  natpara was recalled--she has 1 more dose to take.  She has slight cramps of the hands, and assoc arthralgias.  She takes vit-D, 5000 units/day.   Past Medical History:  Diagnosis Date  . Allergy   . Arthritis   . Frequent falls   . Heart murmur   . Hemorrhoids    past hx   . Low blood sugar   . Thyroid disease     Past Surgical History:  Procedure Laterality Date  . ABDOMINAL HYSTERECTOMY     partial  . BREAST CYST EXCISION Bilateral 1983  . BUNIONECTOMY    . DENTAL SURGERY    . THYROID SURGERY Bilateral 1970    Social History   Socioeconomic History  . Marital status: Single    Spouse name: Not on file  . Number of children: Not on file  . Years of education: Not on file  . Highest education level: Not on file  Occupational History  . Not on file  Social Needs  . Financial resource strain: Not on file  . Food insecurity:    Worry: Not on file    Inability: Not on file  . Transportation needs:    Medical: Not on file    Non-medical: Not on file  Tobacco Use  . Smoking status: Heavy Tobacco Smoker    Packs/day: 0.75    Types: Cigarettes    Start date: 12/16/1978  . Smokeless tobacco: Current User  . Tobacco comment: e cigs at times  Substance and Sexual Activity  . Alcohol use: Yes    Alcohol/week: 12.0 standard drinks    Types: 6 Shots of liquor, 6 Standard drinks or equivalent per week   Comment: 1-2 drinks three times a week   . Drug use: No  . Sexual activity: Not on file  Lifestyle  . Physical activity:    Days per week: Not on file    Minutes per session: Not on file  . Stress: Not on file  Relationships  . Social connections:    Talks on phone: Not on file    Gets together: Not on file    Attends religious service: Not on file    Active member of club or organization: Not on file    Attends meetings of clubs or organizations: Not on file    Relationship status: Not on file  . Intimate partner violence:    Fear of current or ex partner: Not on file    Emotionally abused: Not on file    Physically abused: Not on file    Forced sexual activity: Not on file  Other Topics Concern  . Not on file  Social History Narrative  . Not on file    Current Outpatient Medications on File Prior to Visit  Medication Sig Dispense Refill  . aspirin 81 MG tablet Take 81 mg by mouth daily.     Marland Kitchen  atorvastatin (LIPITOR) 40 MG tablet Take 1 tablet (40 mg total) by mouth daily. 90 tablet 1  . calcitRIOL (ROCALTROL) 0.25 MCG capsule TAKE 1 CAPSULE BY MOUTH ONCE DAILY 90 capsule 3  . diclofenac sodium (VOLTAREN) 1 % GEL Apply 2 g topically 4 (four) times daily. 1 Tube 5  . fish oil-omega-3 fatty acids 1000 MG capsule Take 2 g by mouth daily.     Marland Kitchen levothyroxine (SYNTHROID, LEVOTHROID) 50 MCG tablet Take 1 tablet (50 mcg total) by mouth 2 (two) times daily. 60 tablet 3  . Multiple Vitamin (MULTIVITAMIN) tablet Take 1 tablet by mouth daily.    . traMADol (ULTRAM) 50 MG tablet Take 1 tablet (50 mg total) by mouth 2 (two) times daily as needed. 60 tablet 0  . vitamin B-12 (CYANOCOBALAMIN) 1000 MCG tablet Take 1,000 mcg by mouth daily.     No current facility-administered medications on file prior to visit.     Allergies  Allergen Reactions  . Penicillins     REACTION: Yeast Infections    Family History  Problem Relation Age of Onset  . Hypertension Father   . Hypertension  Sister   . Arthritis Unknown   . Hyperlipidemia Unknown   . Aneurysm Unknown   . Colon cancer Neg Hx   . Esophageal cancer Neg Hx   . Rectal cancer Neg Hx   . Stomach cancer Neg Hx     BP (!) 98/58   Pulse 82   Ht 5\' 8"  (1.727 m)   Wt 142 lb 3.2 oz (64.5 kg)   SpO2 95%   BMI 21.62 kg/m   Review of Systems Denies numbness and weakness.      Objective:   Physical Exam VITAL SIGNS:  See vs page GENERAL: no distress Gait: normal and steady.    Lab Results  Component Value Date   TSH 0.96 03/04/2018   25-OH vit-D=85    Assessment & Plan:  Postsurgical hypoparathyroidism: she needs to transition off natpara, due to recall Vit-D def: slightly overreplaced  Patient Instructions  Please reduce the vitamin-D to 4000 units per day.   You can stop taking the natpara.  At your recent calcium level, we don't need to increase the calcitriol now. Please come back to the lab to redo the blood tests in 5-7 days.   Please come back for a follow-up appointment in 2 months.

## 2018-03-17 ENCOUNTER — Ambulatory Visit: Payer: BLUE CROSS/BLUE SHIELD | Admitting: Endocrinology

## 2018-03-18 ENCOUNTER — Other Ambulatory Visit: Payer: Self-pay | Admitting: Endocrinology

## 2018-03-18 ENCOUNTER — Other Ambulatory Visit (INDEPENDENT_AMBULATORY_CARE_PROVIDER_SITE_OTHER): Payer: BLUE CROSS/BLUE SHIELD

## 2018-03-18 DIAGNOSIS — E209 Hypoparathyroidism, unspecified: Secondary | ICD-10-CM

## 2018-03-18 LAB — BASIC METABOLIC PANEL
BUN: 15 mg/dL (ref 6–23)
CALCIUM: 7.4 mg/dL — AB (ref 8.4–10.5)
CO2: 31 mEq/L (ref 19–32)
Chloride: 103 mEq/L (ref 96–112)
Creatinine, Ser: 0.76 mg/dL (ref 0.40–1.20)
GFR: 98.82 mL/min (ref >60.00)
GLUCOSE: 96 mg/dL (ref 70–99)
Potassium: 4.1 mEq/L (ref 3.5–5.1)
Sodium: 139 mEq/L (ref 135–145)

## 2018-03-18 LAB — VITAMIN D 25 HYDROXY (VIT D DEFICIENCY, FRACTURES): VITD: 81.27 ng/mL (ref 30.00–100.00)

## 2018-03-18 LAB — MAGNESIUM: Magnesium: 1.5 mg/dL (ref 1.5–2.5)

## 2018-03-18 MED ORDER — CHOLECALCIFEROL 75 MCG (3000 UT) PO TABS: 1.0000 | ORAL_TABLET | Freq: Every day | ORAL | 5 refills | Status: DC

## 2018-03-18 MED ORDER — CALCITRIOL 0.5 MCG PO CAPS: 0.5000 ug | ORAL_CAPSULE | Freq: Every day | ORAL | 5 refills | Status: DC

## 2018-03-19 ENCOUNTER — Ambulatory Visit
Admission: RE | Admit: 2018-03-19 | Discharge: 2018-03-19 | Disposition: A | Discharge status: Home / Self Care | Payer: BLUE CROSS/BLUE SHIELD | Source: Ambulatory Visit

## 2018-03-19 ENCOUNTER — Other Ambulatory Visit: Payer: Self-pay | Admitting: Family Medicine

## 2018-03-19 ENCOUNTER — Encounter: Payer: Self-pay | Admitting: Endocrinology

## 2018-03-19 DIAGNOSIS — Z1231 Encounter for screening mammogram for malignant neoplasm of breast: Secondary | ICD-10-CM

## 2018-03-19 LAB — PTH, INTACT AND CALCIUM
Calcium: 7.3 mg/dL — ABNORMAL LOW (ref 8.6–10.4)
PTH: 9 pg/mL — ABNORMAL LOW (ref 14–64)

## 2018-03-23 ENCOUNTER — Encounter: Payer: Self-pay | Admitting: Endocrinology

## 2018-03-26 ENCOUNTER — Other Ambulatory Visit (INDEPENDENT_AMBULATORY_CARE_PROVIDER_SITE_OTHER): Payer: BLUE CROSS/BLUE SHIELD

## 2018-03-26 ENCOUNTER — Other Ambulatory Visit: Payer: Self-pay | Admitting: Endocrinology

## 2018-03-26 DIAGNOSIS — E209 Hypoparathyroidism, unspecified: Secondary | ICD-10-CM

## 2018-03-26 LAB — BASIC METABOLIC PANEL
BUN: 18 mg/dL (ref 6–23)
CALCIUM: 7.8 mg/dL — AB (ref 8.4–10.5)
CO2: 29 meq/L (ref 19–32)
CREATININE: 0.87 mg/dL (ref 0.40–1.20)
Chloride: 103 mEq/L (ref 96–112)
GFR: 84.54 mL/min (ref >60.00)
GLUCOSE: 105 mg/dL — AB (ref 70–99)
Potassium: 3.9 mEq/L (ref 3.5–5.1)
Sodium: 139 mEq/L (ref 135–145)

## 2018-03-26 LAB — VITAMIN D 25 HYDROXY (VIT D DEFICIENCY, FRACTURES): VITD: 80.69 ng/mL (ref 30.00–100.00)

## 2018-03-26 MED ORDER — CALCITRIOL 0.5 MCG PO CAPS: 1.0000 ug | ORAL_CAPSULE | Freq: Every day | ORAL | 5 refills | Status: DC

## 2018-04-01 ENCOUNTER — Encounter: Payer: Self-pay | Admitting: Endocrinology

## 2018-04-02 ENCOUNTER — Other Ambulatory Visit: Payer: Self-pay | Admitting: Endocrinology

## 2018-04-02 ENCOUNTER — Other Ambulatory Visit (INDEPENDENT_AMBULATORY_CARE_PROVIDER_SITE_OTHER): Payer: BLUE CROSS/BLUE SHIELD

## 2018-04-02 ENCOUNTER — Encounter: Payer: Self-pay | Admitting: Family Medicine

## 2018-04-02 ENCOUNTER — Encounter: Payer: Self-pay | Admitting: Endocrinology

## 2018-04-02 DIAGNOSIS — E209 Hypoparathyroidism, unspecified: Secondary | ICD-10-CM

## 2018-04-02 LAB — BASIC METABOLIC PANEL
BUN: 18 mg/dL (ref 6–23)
CALCIUM: 8.3 mg/dL — AB (ref 8.4–10.5)
CO2: 29 mEq/L (ref 19–32)
CREATININE: 0.91 mg/dL (ref 0.40–1.20)
Chloride: 103 mEq/L (ref 96–112)
GFR: 80.26 mL/min (ref >60.00)
GLUCOSE: 108 mg/dL — AB (ref 70–99)
Potassium: 4.2 mEq/L (ref 3.5–5.1)
SODIUM: 140 meq/L (ref 135–145)

## 2018-04-02 MED ORDER — CALCITRIOL 0.25 MCG PO CAPS: 1.2500 ug | ORAL_CAPSULE | Freq: Every day | ORAL | 11 refills | Status: DC

## 2018-04-02 MED ORDER — LEVOTHYROXINE SODIUM 50 MCG PO TABS: 50.0000 ug | ORAL_TABLET | Freq: Two times a day (BID) | ORAL | 6 refills | Status: DC

## 2018-04-07 ENCOUNTER — Encounter: Payer: Self-pay | Admitting: Endocrinology

## 2018-04-08 ENCOUNTER — Other Ambulatory Visit: Payer: Self-pay | Admitting: Endocrinology

## 2018-04-08 ENCOUNTER — Telehealth: Payer: Self-pay | Admitting: Endocrinology

## 2018-04-08 ENCOUNTER — Other Ambulatory Visit (INDEPENDENT_AMBULATORY_CARE_PROVIDER_SITE_OTHER): Payer: BLUE CROSS/BLUE SHIELD

## 2018-04-08 DIAGNOSIS — E209 Hypoparathyroidism, unspecified: Secondary | ICD-10-CM

## 2018-04-08 LAB — BASIC METABOLIC PANEL
BUN: 16 mg/dL (ref 6–23)
CALCIUM: 8.2 mg/dL — AB (ref 8.4–10.5)
CO2: 28 meq/L (ref 19–32)
CREATININE: 0.84 mg/dL (ref 0.40–1.20)
Chloride: 103 mEq/L (ref 96–112)
GFR: 88.03 mL/min (ref >60.00)
GLUCOSE: 95 mg/dL (ref 70–99)
Potassium: 3.8 mEq/L (ref 3.5–5.1)
Sodium: 139 mEq/L (ref 135–145)

## 2018-04-08 MED ORDER — CALCITRIOL 0.5 MCG PO CAPS: 1.5000 ug | ORAL_CAPSULE | Freq: Every day | ORAL | 11 refills | Status: DC

## 2018-04-08 NOTE — Telephone Encounter (Signed)
please call patient: I received this letter, too.  Few patients qualify for this, and you fortunately do not qualify.

## 2018-04-08 NOTE — Telephone Encounter (Signed)
Pt informed, pt wanted to know if she will be required to continue blood work

## 2018-04-09 ENCOUNTER — Other Ambulatory Visit: Payer: BLUE CROSS/BLUE SHIELD

## 2018-04-16 ENCOUNTER — Other Ambulatory Visit (INDEPENDENT_AMBULATORY_CARE_PROVIDER_SITE_OTHER): Payer: BLUE CROSS/BLUE SHIELD

## 2018-04-16 DIAGNOSIS — E209 Hypoparathyroidism, unspecified: Secondary | ICD-10-CM

## 2018-04-16 LAB — BASIC METABOLIC PANEL
BUN: 16 mg/dL (ref 6–23)
CALCIUM: 8.6 mg/dL (ref 8.4–10.5)
CO2: 29 meq/L (ref 19–32)
CREATININE: 0.82 mg/dL (ref 0.40–1.20)
Chloride: 102 mEq/L (ref 96–112)
GFR: 90.5 mL/min (ref >60.00)
GLUCOSE: 120 mg/dL — AB (ref 70–99)
Potassium: 4.2 mEq/L (ref 3.5–5.1)
SODIUM: 141 meq/L (ref 135–145)

## 2018-04-20 ENCOUNTER — Encounter: Payer: Self-pay | Admitting: Endocrinology

## 2018-05-17 ENCOUNTER — Encounter: Payer: Self-pay | Admitting: Endocrinology

## 2018-05-19 ENCOUNTER — Ambulatory Visit: Payer: BLUE CROSS/BLUE SHIELD | Admitting: Endocrinology

## 2018-05-19 ENCOUNTER — Encounter: Payer: Self-pay | Admitting: Endocrinology

## 2018-05-19 DIAGNOSIS — E209 Hypoparathyroidism, unspecified: Secondary | ICD-10-CM | POA: Diagnosis not present

## 2018-05-19 LAB — BASIC METABOLIC PANEL
BUN: 16 mg/dL (ref 6–23)
CHLORIDE: 102 meq/L (ref 96–112)
CO2: 28 meq/L (ref 19–32)
Calcium: 8.8 mg/dL (ref 8.4–10.5)
Creatinine, Ser: 0.8 mg/dL (ref 0.40–1.20)
GFR: 93.09 mL/min (ref >60.00)
Glucose, Bld: 92 mg/dL (ref 70–99)
POTASSIUM: 3.7 meq/L (ref 3.5–5.1)
Sodium: 138 mEq/L (ref 135–145)

## 2018-05-19 LAB — VITAMIN D 25 HYDROXY (VIT D DEFICIENCY, FRACTURES): VITD: 71.04 ng/mL (ref 30.00–100.00)

## 2018-05-19 NOTE — Patient Instructions (Signed)
blood tests are requested for you today.  We'll let you know about the results.   Please come back for a follow-up appointment in 2 months.  

## 2018-05-19 NOTE — Progress Notes (Signed)
Subjective:    Patient ID: Tricia Woodward, female    DOB: [birth year], 62 y.o.   MRN: [id number]  HPI Pt returns for f/u of postsurgical hypoparathyroidism (she had a thyroidectomy in the 1970's, in [place], for suspicious nodules, which proved on pathol to be benign; since then she has had hypoparathyroidism; in mid-2015, she was found to have low vitamin-D, and was rx'ed ergocalciferol; she also took forteo samples for a few mos in 2015, but had to d/c as it was declined by insurance; she is on chronic rocaltrol; [name] was added in Nov of 2017; [name] was recalled)  She has slight cramps of the hands, and assoc arthralgias.  She takes vit-D, 1000 units/day, and rocaltrol 3x0.5 mcg/d.  She has slight cramps of the hands, and assoc fatigue.  Past Medical History:  Diagnosis Date  . Allergy   . Arthritis   . Frequent falls   . Heart murmur   . Hemorrhoids    past hx   . Low blood sugar   . Thyroid disease     Past Surgical History:  Procedure Laterality Date  . ABDOMINAL HYSTERECTOMY     partial  . BREAST CYST EXCISION Bilateral 1983  . BUNIONECTOMY    . DENTAL SURGERY    . THYROID SURGERY Bilateral 1970    Social History   Socioeconomic History  . Marital status: Single    Spouse name: Not on file  . Number of children: Not on file  . Years of education: Not on file  . Highest education level: Not on file  Occupational History  . Not on file  Social Needs  . Financial resource strain: Not on file  . Food insecurity:    Worry: Not on file    Inability: Not on file  . Transportation needs:    Medical: Not on file    Non-medical: Not on file  Tobacco Use  . Smoking status: Heavy Tobacco Smoker    Packs/day: 0.75    Types: Cigarettes    Start date: 12/16/1978  . Smokeless tobacco: Current User  . Tobacco comment: e cigs at times  Substance and Sexual Activity  . Alcohol use: Yes    Alcohol/week: 12.0 standard drinks    Types: 6 Shots of liquor, 6 Standard drinks or  equivalent per week    Comment: 1-2 drinks three times a week   . Drug use: No  . Sexual activity: Not on file  Lifestyle  . Physical activity:    Days per week: Not on file    Minutes per session: Not on file  . Stress: Not on file  Relationships  . Social connections:    Talks on phone: Not on file    Gets together: Not on file    Attends religious service: Not on file    Active member of club or organization: Not on file    Attends meetings of clubs or organizations: Not on file    Relationship status: Not on file  . Intimate partner violence:    Fear of current or ex partner: Not on file    Emotionally abused: Not on file    Physically abused: Not on file    Forced sexual activity: Not on file  Other Topics Concern  . Not on file  Social History Narrative  . Not on file    Current Outpatient Medications on File Prior to Visit  Medication Sig Dispense Refill  . aspirin 81 MG tablet Take 81  mg by mouth daily.     Marland Kitchen atorvastatin (LIPITOR) 40 MG tablet Take 1 tablet (40 mg total) by mouth daily. 90 tablet 1  . calcitRIOL (ROCALTROL) 0.5 MCG capsule Take 3 capsules (1.5 mcg total) by mouth daily. 90 capsule 11  . cholecalciferol (VITAMIN D3) 25 MCG (1000 UT) tablet Take 1,000 Units by mouth daily.    . diclofenac sodium (VOLTAREN) 1 % GEL Apply 2 g topically 4 (four) times daily. 1 Tube 5  . fish oil-omega-3 fatty acids 1000 MG capsule Take 2 g by mouth daily.     Marland Kitchen levothyroxine (SYNTHROID, LEVOTHROID) 50 MCG tablet Take 1 tablet (50 mcg total) by mouth 2 (two) times daily. 60 tablet 6  . Multiple Vitamin (MULTIVITAMIN) tablet Take 1 tablet by mouth daily.    . traMADol (ULTRAM) 50 MG tablet Take 1 tablet (50 mg total) by mouth 2 (two) times daily as needed. 60 tablet 0  . vitamin B-12 (CYANOCOBALAMIN) 1000 MCG tablet Take 1,000 mcg by mouth daily.     No current facility-administered medications on file prior to visit.     Allergies  Allergen Reactions  . Penicillins       REACTION: Yeast Infections    Family History  Problem Relation Age of Onset  . Hypertension Father   . Hypertension Sister   . Arthritis Unknown   . Hyperlipidemia Unknown   . Aneurysm Unknown   . Colon cancer Neg Hx   . Esophageal cancer Neg Hx   . Rectal cancer Neg Hx   . Stomach cancer Neg Hx   . Breast cancer Neg Hx     BP 102/64 (BP Location: Left Arm, Patient Position: Sitting, Cuff Size: Normal)   Pulse 93   Ht 5\' 8"  (1.727 m)   Wt 144 lb 9.6 oz (65.6 kg)   SpO2 95%   BMI 21.99 kg/m    Review of Systems No numbness or weakness.      Objective:   Physical Exam VITAL SIGNS:  See vs page GENERAL: no distress MSK: normal strength of the hands.  Lab Results  Component Value Date   PTH 9 (L) 03/18/2018   CALCIUM 8.8 05/19/2018   CAION 0.84 (L) 08/06/2006   PHOS 3.9 11/18/2016       Assessment & Plan:  Vit-D def: well-controlled Hypoparathyroidism: well-controlled.  Patient Instructions  blood tests are requested for you today.  We'll let you know about the results.  Please come back for a follow-up appointment in 2 months.

## 2018-07-14 ENCOUNTER — Ambulatory Visit: Payer: BLUE CROSS/BLUE SHIELD | Admitting: Family Medicine

## 2018-07-14 ENCOUNTER — Other Ambulatory Visit: Payer: Self-pay

## 2018-07-14 ENCOUNTER — Encounter: Payer: Self-pay | Admitting: Family Medicine

## 2018-07-14 VITALS — BP 118/78 | HR 113 | Temp 98.6°F | Resp 16 | Ht 68.0 in | Wt 140.4 lb

## 2018-07-14 DIAGNOSIS — B349 Viral infection, unspecified: Secondary | ICD-10-CM | POA: Diagnosis not present

## 2018-07-14 DIAGNOSIS — J029 Acute pharyngitis, unspecified: Secondary | ICD-10-CM

## 2018-07-14 LAB — POCT RAPID STREP A (OFFICE): Rapid Strep A Screen: NEGATIVE

## 2018-07-14 MED ORDER — ONDANSETRON HCL 4 MG PO TABS: 4.0000 mg | ORAL_TABLET | Freq: Three times a day (TID) | ORAL | 0 refills | Status: DC | PRN

## 2018-07-14 NOTE — Progress Notes (Signed)
   Subjective:    Patient ID: Tricia Woodward, female    DOB: 08-26-1955, 63 y.o.   MRN: [id number]  HPI Sore throat- sxs started ~3 days ago.  + diarrhea, chills, sweats.  + sore throat.  + nausea.  No vomiting.  Pt has eaten.  Mild intermittent cough.  + sick contacts.   Review of Systems For ROS see HPI     Objective:   Physical Exam Vitals signs reviewed.  Constitutional:      General: She is not in acute distress.    Appearance: She is well-developed. She is ill-appearing.  HENT:     Head: Normocephalic and atraumatic.     Right Ear: Tympanic membrane normal.     Left Ear: Tympanic membrane normal.     Nose: Congestion and rhinorrhea present.     Mouth/Throat:     Mouth: Mucous membranes are moist. No oral lesions.     Pharynx: Oropharynx is clear. Posterior oropharyngeal erythema present. No pharyngeal swelling or oropharyngeal exudate.     Tonsils: No tonsillar exudate or tonsillar abscesses.  Eyes:     Conjunctiva/sclera: Conjunctivae normal.  Neck:     Musculoskeletal: Normal range of motion and neck supple.  Lymphadenopathy:     Cervical: No cervical adenopathy.  Skin:    General: Skin is warm and dry.  Neurological:     General: No focal deficit present.     Mental Status: She is alert and oriented to person, place, and time.           Assessment & Plan:  Viral illness- strep test was negative.  No other evidence of bacterial infection on exam.  Given her diarrhea, sore throat, intermittent cough, runny nose- suspect viral illness (which would fit given recent sick contacts).  Start Zofran prn.  Reviewed supportive care and red flags that should prompt return.  Pt expressed understanding and is in agreement w/ plan.

## 2018-07-14 NOTE — Patient Instructions (Addendum)
Follow up as needed or as scheduled This is a viral illness and will improve with time Drink LOTS of fluids REST! Make sure you are eating throughout the day to avoid nausea Take the Zofraon (Ondansetron) to improve nausea and help firm up the bowels Use Immodium as needed Call with any questions or concerns Hang in there!!!

## 2018-07-15 ENCOUNTER — Ambulatory Visit: Payer: Self-pay | Admitting: *Deleted

## 2018-07-15 NOTE — Telephone Encounter (Signed)
Contacted pt regarding symptoms; she says that after being seen in office 07/14/2018;  she took the med and the diarrhea stopped after one dose and the vomiting has also subsided; she had juice and was able to keep some soup down the pt says still feel achy all over, sore throat, headache, weak and shaky; the pt says that she is urinating and had a normal BM;  reviewed AVS instructions per Dr Birdie Riddle dated 07/14/2018; the pt says that she took a tylenol pm 07/14/2018; recommendations given per nurse triage protocol; the pt verbalized understanding, and will call back if she feels like she is not getting better; will route to office for notification.  Reason for Disposition . Muscle aches and fever from a minor viral illness (e.g., common cold)  Answer Assessment - Initial Assessment Questions 1. ONSET: "When did the muscle aches or body pains start?"      Seen in office 07/14/2018  2. LOCATION: "What part of your body is hurting?" (e.g., entire body, arms, legs)      Entire body  3. SEVERITY: "How bad is the pain?" (Scale 1-10; or mild, moderate, severe)   - MILD (1-3): doesn't interfere with normal activities    - MODERATE (4-7): interferes with normal activities or awakens from sleep    - SEVERE (8-10):  excruciating pain, unable to do any normal activities      moderate 4. CAUSE: "What do you think is causing the pains?"     Seen in office 07/14/2018 5. FEVER: "Have you been having fever?"     no 6. OTHER SYMPTOMS: "Do you have any other symptoms?" (e.g., chest pain, weakness, rash, cold or flu symptoms, weight loss)     Sore throat, headache 7. PREGNANCY: "Is there any chance you are pregnant?" "When was your last menstrual period?"     no 8. TRAVEL: "Have you traveled out of the country in the last month?" (e.g., travel history, exposures)     no  Protocols used: MUSCLE ACHES AND BODY PAIN-A-AH

## 2018-07-19 NOTE — Telephone Encounter (Signed)
Close for admin purpose

## 2018-07-21 ENCOUNTER — Encounter: Payer: Self-pay | Admitting: Endocrinology

## 2018-07-21 ENCOUNTER — Ambulatory Visit (INDEPENDENT_AMBULATORY_CARE_PROVIDER_SITE_OTHER): Payer: BLUE CROSS/BLUE SHIELD | Admitting: Endocrinology

## 2018-07-21 VITALS — BP 100/60 | HR 96 | Ht 68.0 in | Wt 144.0 lb

## 2018-07-21 DIAGNOSIS — I959 Hypotension, unspecified: Secondary | ICD-10-CM | POA: Diagnosis not present

## 2018-07-21 DIAGNOSIS — E890 Postprocedural hypothyroidism: Secondary | ICD-10-CM | POA: Diagnosis not present

## 2018-07-21 DIAGNOSIS — E209 Hypoparathyroidism, unspecified: Secondary | ICD-10-CM | POA: Diagnosis not present

## 2018-07-21 DIAGNOSIS — R200 Anesthesia of skin: Secondary | ICD-10-CM

## 2018-07-21 LAB — BASIC METABOLIC PANEL
BUN: 11 mg/dL (ref 6–23)
CO2: 29 mEq/L (ref 19–32)
Calcium: 8.6 mg/dL (ref 8.4–10.5)
Chloride: 102 mEq/L (ref 96–112)
Creatinine, Ser: 0.77 mg/dL (ref 0.40–1.20)
GFR: 91.48 mL/min (ref >60.00)
GLUCOSE: 84 mg/dL (ref 70–99)
POTASSIUM: 4.2 meq/L (ref 3.5–5.1)
Sodium: 141 mEq/L (ref 135–145)

## 2018-07-21 LAB — VITAMIN B12: VITAMIN B 12: 1146 pg/mL — AB (ref 211–911)

## 2018-07-21 LAB — TSH: TSH: 0.9 u[IU]/mL (ref 0.35–4.50)

## 2018-07-21 LAB — VITAMIN D 25 HYDROXY (VIT D DEFICIENCY, FRACTURES): VITD: 60.92 ng/mL (ref 30.00–100.00)

## 2018-07-21 NOTE — Progress Notes (Signed)
Subjective:    Patient ID: Tricia Woodward, female    DOB: 12/07/1955, 63 y.o.   MRN: [id number]  HPI Pt returns for f/u of postsurgical hypoparathyroidism (she had a thyroidectomy in the 1970's, in [place], for suspicious nodules, which proved on pathol to be benign; since then she has had hypoparathyroidism; in mid-2015, she was found to have low vitamin-D, and was rx'ed ergocalciferol; she also took forteo samples for a few mos in 2015, but had to d/c as it was declined by insurance; she is on chronic rocaltrol; [name] was added in Nov of 2017; [name] was recalled)  She has slight cramps of the hands, and assoc arthralgias.  She takes vit-D, 1000 units/day, and rocaltrol 3x0.5 mcg/d.  pt states slight cramps in the legs, and assoc fatigue.   She takes B-12 tabs 1/day Past Medical History:  Diagnosis Date  . Allergy   . Arthritis   . Frequent falls   . Heart murmur   . Hemorrhoids    past hx   . Low blood sugar   . Thyroid disease     Past Surgical History:  Procedure Laterality Date  . ABDOMINAL HYSTERECTOMY     partial  . BREAST CYST EXCISION Bilateral 1983  . BUNIONECTOMY    . DENTAL SURGERY    . THYROID SURGERY Bilateral 1970    Social History   Socioeconomic History  . Marital status: Single    Spouse name: Not on file  . Number of children: Not on file  . Years of education: Not on file  . Highest education level: Not on file  Occupational History  . Not on file  Social Needs  . Financial resource strain: Not on file  . Food insecurity:    Worry: Not on file    Inability: Not on file  . Transportation needs:    Medical: Not on file    Non-medical: Not on file  Tobacco Use  . Smoking status: Heavy Tobacco Smoker    Packs/day: 0.75    Types: Cigarettes    Start date: 12/16/1978  . Smokeless tobacco: Current User  . Tobacco comment: e cigs at times  Substance and Sexual Activity  . Alcohol use: Yes    Alcohol/week: 12.0 standard drinks    Types: 6 Shots of  liquor, 6 Standard drinks or equivalent per week    Comment: 1-2 drinks three times a week   . Drug use: No  . Sexual activity: Not on file  Lifestyle  . Physical activity:    Days per week: Not on file    Minutes per session: Not on file  . Stress: Not on file  Relationships  . Social connections:    Talks on phone: Not on file    Gets together: Not on file    Attends religious service: Not on file    Active member of club or organization: Not on file    Attends meetings of clubs or organizations: Not on file    Relationship status: Not on file  . Intimate partner violence:    Fear of current or ex partner: Not on file    Emotionally abused: Not on file    Physically abused: Not on file    Forced sexual activity: Not on file  Other Topics Concern  . Not on file  Social History Narrative  . Not on file    Current Outpatient Medications on File Prior to Visit  Medication Sig Dispense Refill  .  aspirin 81 MG tablet Take 81 mg by mouth daily.     Marland Kitchen atorvastatin (LIPITOR) 40 MG tablet Take 1 tablet (40 mg total) by mouth daily. 90 tablet 1  . calcitRIOL (ROCALTROL) 0.5 MCG capsule Take 3 capsules (1.5 mcg total) by mouth daily. 90 capsule 11  . cholecalciferol (VITAMIN D3) 25 MCG (1000 UT) tablet Take 1,000 Units by mouth daily.    . diclofenac sodium (VOLTAREN) 1 % GEL Apply 2 g topically 4 (four) times daily. 1 Tube 5  . fish oil-omega-3 fatty acids 1000 MG capsule Take 2 g by mouth daily.     Marland Kitchen levothyroxine (SYNTHROID, LEVOTHROID) 50 MCG tablet Take 1 tablet (50 mcg total) by mouth 2 (two) times daily. 60 tablet 6  . Multiple Vitamin (MULTIVITAMIN) tablet Take 1 tablet by mouth daily.    . traMADol (ULTRAM) 50 MG tablet Take 1 tablet (50 mg total) by mouth 2 (two) times daily as needed. 60 tablet 0  . vitamin B-12 (CYANOCOBALAMIN) 1000 MCG tablet Take 1,000 mcg by mouth daily.     No current facility-administered medications on file prior to visit.     Allergies  Allergen  Reactions  . Penicillins     REACTION: Yeast Infections    Family History  Problem Relation Age of Onset  . Hypertension Father   . Hypertension Sister   . Arthritis Other   . Hyperlipidemia Other   . Aneurysm Other   . Colon cancer Neg Hx   . Esophageal cancer Neg Hx   . Rectal cancer Neg Hx   . Stomach cancer Neg Hx   . Breast cancer Neg Hx     BP 100/60 (BP Location: Right Arm, Patient Position: Sitting, Cuff Size: Normal)   Pulse 96   Ht 5\' 8"  (1.727 m)   Wt 144 lb (65.3 kg)   SpO2 92%   BMI 21.90 kg/m   Review of Systems She has numbness of the hands and feet    Objective:   Physical Exam VITAL SIGNS:  See vs page GENERAL: no distress Neck: a healed scar is present.  I do not appreciate a nodule in the thyroid or elsewhere in the neck   Lab Results  Component Value Date   PTH 9 (L) 03/18/2018   CALCIUM 8.6 07/21/2018   CAION 0.84 (L) 08/06/2006   PHOS 3.9 11/18/2016   Lab Results  Component Value Date   TSH 0.90 07/21/2018      Assessment & Plan:  Hypoparathyroidism: this is the best control this pt should aim for, given the unavailability of Natpara. B-12 def: recheck today Hypothyroidism: well-replaced   Patient Instructions  blood tests are requested for you today.  We'll let you know about the results.  Please come back for a follow-up appointment in 3 months.

## 2018-07-21 NOTE — Patient Instructions (Addendum)
blood tests are requested for you today.  We'll let you know about the results.   Please come back for a follow-up appointment in 3 months.   

## 2018-07-22 ENCOUNTER — Encounter: Payer: Self-pay | Admitting: Endocrinology

## 2018-07-22 NOTE — Telephone Encounter (Signed)
Please advise 

## 2018-08-31 ENCOUNTER — Other Ambulatory Visit: Payer: Self-pay | Admitting: Family Medicine

## 2018-09-01 ENCOUNTER — Encounter: Payer: Self-pay | Admitting: Family Medicine

## 2018-09-01 ENCOUNTER — Encounter: Payer: Self-pay | Admitting: Endocrinology

## 2018-09-01 NOTE — Telephone Encounter (Signed)
FYI

## 2018-09-02 ENCOUNTER — Ambulatory Visit: Payer: BLUE CROSS/BLUE SHIELD | Admitting: Family Medicine

## 2018-09-02 NOTE — Telephone Encounter (Signed)
Please refer to pt message

## 2018-09-03 ENCOUNTER — Ambulatory Visit: Payer: BLUE CROSS/BLUE SHIELD | Admitting: Family Medicine

## 2018-09-07 ENCOUNTER — Encounter: Payer: Self-pay | Admitting: Family Medicine

## 2018-09-07 ENCOUNTER — Other Ambulatory Visit: Payer: Self-pay | Admitting: Family Medicine

## 2018-09-07 DIAGNOSIS — E785 Hyperlipidemia, unspecified: Secondary | ICD-10-CM

## 2018-09-08 MED ORDER — ATORVASTATIN CALCIUM 40 MG PO TABS: 40.0000 mg | ORAL_TABLET | Freq: Every day | ORAL | 0 refills | Status: DC

## 2018-10-11 ENCOUNTER — Other Ambulatory Visit: Payer: Self-pay | Admitting: Family Medicine

## 2018-10-11 NOTE — Telephone Encounter (Signed)
Ok to fill this medication?

## 2018-10-12 ENCOUNTER — Telehealth: Payer: Self-pay | Admitting: Family Medicine

## 2018-10-12 DIAGNOSIS — E890 Postprocedural hypothyroidism: Secondary | ICD-10-CM

## 2018-10-12 DIAGNOSIS — E209 Hypoparathyroidism, unspecified: Secondary | ICD-10-CM

## 2018-10-12 MED ORDER — LEVOTHYROXINE SODIUM 50 MCG PO TABS: 50.0000 ug | ORAL_TABLET | Freq: Two times a day (BID) | ORAL | 0 refills | Status: DC

## 2018-10-12 NOTE — Telephone Encounter (Signed)
Copied from [place] [id number]. Topic: General - Other >> Oct 12, 2018  3:10 PM Wynetta Emery, [place] C wrote: Reason for CRM: pt says that she spoke with pharmacy and was told that they have not received a Rx for EUTHYROX 50 MCG tablet .  Confirmed pharmacy with pt, pharmacy is correct. Pt would like to know if Rx could be re-sent?   Please assist.

## 2018-10-12 NOTE — Telephone Encounter (Signed)
Refilled the Levothyroxine 50 mcg to the patient preferred pharmacy

## 2018-10-18 ENCOUNTER — Encounter: Payer: Self-pay | Admitting: Endocrinology

## 2018-10-18 NOTE — Telephone Encounter (Signed)
Please advise 

## 2018-10-20 ENCOUNTER — Ambulatory Visit: Payer: BLUE CROSS/BLUE SHIELD | Admitting: Endocrinology

## 2019-02-04 ENCOUNTER — Emergency Department (HOSPITAL_COMMUNITY): Payer: BLUE CROSS/BLUE SHIELD

## 2019-02-04 ENCOUNTER — Encounter (HOSPITAL_COMMUNITY): Payer: Self-pay

## 2019-02-04 ENCOUNTER — Other Ambulatory Visit: Payer: Self-pay

## 2019-02-04 ENCOUNTER — Emergency Department (HOSPITAL_COMMUNITY)
Admission: EM | Admit: 2019-02-04 | Discharge: 2019-02-04 | Disposition: A | Discharge status: Other Acute Inpatient Hospital | Payer: BLUE CROSS/BLUE SHIELD | Attending: Emergency Medicine | Admitting: Emergency Medicine

## 2019-02-04 DIAGNOSIS — S01119A Laceration without foreign body of unspecified eyelid and periocular area, initial encounter: Secondary | ICD-10-CM | POA: Insufficient documentation

## 2019-02-04 DIAGNOSIS — R51 Headache: Secondary | ICD-10-CM | POA: Diagnosis not present

## 2019-02-04 DIAGNOSIS — Y9301 Activity, walking, marching and hiking: Secondary | ICD-10-CM | POA: Diagnosis not present

## 2019-02-04 DIAGNOSIS — F17210 Nicotine dependence, cigarettes, uncomplicated: Secondary | ICD-10-CM | POA: Insufficient documentation

## 2019-02-04 DIAGNOSIS — E079 Disorder of thyroid, unspecified: Secondary | ICD-10-CM | POA: Insufficient documentation

## 2019-02-04 DIAGNOSIS — S0011XA Contusion of right eyelid and periocular area, initial encounter: Secondary | ICD-10-CM | POA: Diagnosis not present

## 2019-02-04 DIAGNOSIS — W109XXA Fall (on) (from) unspecified stairs and steps, initial encounter: Secondary | ICD-10-CM | POA: Diagnosis not present

## 2019-02-04 DIAGNOSIS — Z23 Encounter for immunization: Secondary | ICD-10-CM | POA: Insufficient documentation

## 2019-02-04 DIAGNOSIS — T148XXA Other injury of unspecified body region, initial encounter: Secondary | ICD-10-CM

## 2019-02-04 DIAGNOSIS — Z20828 Contact with and (suspected) exposure to other viral communicable diseases: Secondary | ICD-10-CM | POA: Diagnosis not present

## 2019-02-04 DIAGNOSIS — Y999 Unspecified external cause status: Secondary | ICD-10-CM | POA: Diagnosis not present

## 2019-02-04 DIAGNOSIS — W19XXXA Unspecified fall, initial encounter: Secondary | ICD-10-CM

## 2019-02-04 DIAGNOSIS — Y92019 Unspecified place in single-family (private) house as the place of occurrence of the external cause: Secondary | ICD-10-CM | POA: Insufficient documentation

## 2019-02-04 DIAGNOSIS — Z7982 Long term (current) use of aspirin: Secondary | ICD-10-CM | POA: Insufficient documentation

## 2019-02-04 LAB — CBC WITH DIFFERENTIAL/PLATELET
Abs Immature Granulocytes: 0.01 10*3/uL (ref 0.00–0.07)
Basophils Absolute: 0 10*3/uL (ref 0.0–0.1)
Basophils Relative: 0 %
Eosinophils Absolute: 0.1 10*3/uL (ref 0.0–0.5)
Eosinophils Relative: 2 %
HCT: 36 % (ref 36.0–46.0)
Hemoglobin: 12.1 g/dL (ref 12.0–15.0)
Immature Granulocytes: 0 %
Lymphocytes Relative: 52 %
Lymphs Abs: 3.5 10*3/uL (ref 0.7–4.0)
MCH: 30.6 pg (ref 26.0–34.0)
MCHC: 33.6 g/dL (ref 30.0–36.0)
MCV: 91.1 fL (ref 80.0–100.0)
Monocytes Absolute: 0.9 10*3/uL (ref 0.1–1.0)
Monocytes Relative: 14 %
Neutro Abs: 2.2 10*3/uL (ref 1.7–7.7)
Neutrophils Relative %: 32 %
Platelets: 244 10*3/uL (ref 150–400)
RBC: 3.95 MIL/uL (ref 3.87–5.11)
RDW: 15 % (ref 11.5–15.5)
WBC: 6.5 10*3/uL (ref 4.0–10.5)
nRBC: 0 % (ref 0.0–0.2)

## 2019-02-04 LAB — I-STAT CHEM 8, ED
BUN: 16 mg/dL (ref 8–23)
Calcium, Ion: 1.02 mmol/L — ABNORMAL LOW (ref 1.15–1.40)
Chloride: 102 mmol/L (ref 98–111)
Creatinine, Ser: 0.7 mg/dL (ref 0.44–1.00)
Glucose, Bld: 104 mg/dL — ABNORMAL HIGH (ref 70–99)
HCT: 39 % (ref 36.0–46.0)
Hemoglobin: 13.3 g/dL (ref 12.0–15.0)
Potassium: 3.6 mmol/L (ref 3.5–5.1)
Sodium: 137 mmol/L (ref 135–145)
TCO2: 26 mmol/L (ref 22–32)

## 2019-02-04 LAB — SARS CORONAVIRUS 2 BY RT PCR (HOSPITAL ORDER, PERFORMED IN ~~LOC~~ HOSPITAL LAB): SARS Coronavirus 2: NEGATIVE

## 2019-02-04 MED ORDER — TETANUS-DIPHTH-ACELL PERTUSSIS 5-2.5-18.5 LF-MCG/0.5 IM SUSP
0.5000 mL | Freq: Once | INTRAMUSCULAR | Status: AC
  Administered 2019-02-04: 0.5 mL via INTRAMUSCULAR
  Filled 2019-02-04: qty 0.5

## 2019-02-04 MED ORDER — FENTANYL CITRATE (PF) 100 MCG/2ML IJ SOLN
25.0000 ug | Freq: Once | INTRAMUSCULAR | Status: AC
  Administered 2019-02-04: 25 ug via INTRAVENOUS
  Filled 2019-02-04: qty 2

## 2019-02-04 MED ORDER — IOHEXOL 300 MG/ML  SOLN
100.0000 mL | Freq: Once | INTRAMUSCULAR | Status: AC | PRN
  Administered 2019-02-04: 06:00:00 100 mL via INTRAVENOUS

## 2019-02-04 MED ORDER — ONDANSETRON HCL 4 MG/2ML IJ SOLN
4.0000 mg | Freq: Once | INTRAMUSCULAR | Status: AC
  Administered 2019-02-04: 4 mg via INTRAVENOUS
  Filled 2019-02-04: qty 2

## 2019-02-04 NOTE — ED Provider Notes (Signed)
[facility] EMERGENCY DEPARTMENT Provider Note   CSN: [id number] Arrival date & time: 02/04/19  0416     History   Chief Complaint Chief Complaint  Patient presents with   Laceration   Fall    HPI Tricia Woodward is a 63 y.o. female.     The history is provided by the patient.  Laceration Location:  Face Facial laceration location:  R eyelid Length:  1 Depth:  Through underlying tissue Quality: jagged   Bleeding: controlled   Laceration mechanism:  Fall Pain details:    Quality:  Aching   Timing:  Constant   Progression:  Unchanged Foreign body present:  No foreign bodies Relieved by:  Nothing Worsened by:  Nothing Ineffective treatments:  None tried Tetanus status:  Unknown Associated symptoms: no fever, no focal weakness and no rash   Fall This is a new problem. The current episode started less than 1 hour ago. The problem occurs constantly. The problem has not changed since onset.Pertinent negatives include no chest pain, no abdominal pain, no headaches and no shortness of breath. Nothing aggravates the symptoms. Nothing relieves the symptoms. She has tried nothing for the symptoms. The treatment provided no relief.  Fell down thirteen steps laceration through the lateral canthus  Past Medical History:  Diagnosis Date   Allergy    Arthritis    Frequent falls    Heart murmur    Hemorrhoids    past hx    Low blood sugar    Thyroid disease     Patient Active Problem List   Diagnosis Date Noted   Numbness 07/21/2018   Status post carpal tunnel release 04/17/2016   Physical exam 05/03/2015   OAB (overactive bladder) 11/02/2014   Post-surgical hypothyroidism 10/10/2014   Visual loss 10/10/2014   Hypotension 10/10/2014   Chronic back pain 09/07/2013   Paresthesia of hand, bilateral 09/07/2013   Recurrent falls 01/17/2013   TOBACCO ABUSE 05/15/2010   GOITER 05/14/2010   Hyperlipidemia 05/14/2010   HYPOCALCEMIA  05/14/2010   HYPOPARATHYROIDISM 03/14/2010    Past Surgical History:  Procedure Laterality Date   ABDOMINAL HYSTERECTOMY     partial   BREAST CYST EXCISION Bilateral 1983   BUNIONECTOMY     DENTAL SURGERY     THYROID SURGERY Bilateral 1970     OB History   No obstetric history on file.      Home Medications    Prior to Admission medications   Medication Sig Start Date End Date Taking? Authorizing Provider  aspirin 81 MG tablet Take 81 mg by mouth daily.     [provider]  atorvastatin (LIPITOR) 40 MG tablet Take 1 tablet (40 mg total) by mouth daily. 09/08/18   Midge Minium, MD  calcitRIOL (ROCALTROL) 0.5 MCG capsule Take 3 capsules (1.5 mcg total) by mouth daily. 04/08/18   Renato Shin, MD  cholecalciferol (VITAMIN D3) 25 MCG (1000 UT) tablet Take 1,000 Units by mouth daily.    [provider]  diclofenac sodium (VOLTAREN) 1 % GEL Apply 2 g topically 4 (four) times daily. 03/04/18   Midge Minium, MD  fish oil-omega-3 fatty acids 1000 MG capsule Take 2 g by mouth daily.     [provider]  levothyroxine (EUTHYROX) 50 MCG tablet Take 1 tablet (50 mcg total) by mouth 2 (two) times daily. 10/12/18   Midge Minium, MD  Multiple Vitamin (MULTIVITAMIN) tablet Take 1 tablet by mouth daily.    [provider]  traMADol (ULTRAM) 50 MG tablet Take 1 tablet (50 mg total) by mouth 2 (two) times daily as needed. 01/18/18   Midge Minium, MD  vitamin B-12 (CYANOCOBALAMIN) 1000 MCG tablet Take 1,000 mcg by mouth daily. 12/16/11   [provider]    Family History Family History  Problem Relation Age of Onset   Hypertension Father    Hypertension Sister    Arthritis Other    Hyperlipidemia Other    Aneurysm Other    Colon cancer Neg Hx    Esophageal cancer Neg Hx    Rectal cancer Neg Hx    Stomach cancer Neg Hx    Breast cancer Neg Hx     Social History Social History   Tobacco Use   Smoking  status: Heavy Tobacco Smoker    Packs/day: 0.75    Types: Cigarettes    Start date: 12/16/1978   Smokeless tobacco: Current User   Tobacco comment: e cigs at times  Substance Use Topics   Alcohol use: Yes    Alcohol/week: 18.0 standard drinks    Types: 6 Shots of liquor, 6 Standard drinks or equivalent, 6 Cans of beer per week    Comment: 1-2 drinks three times a week    Drug use: No     Allergies   Penicillins   Review of Systems Review of Systems  Constitutional: Negative for fever.  HENT: Positive for facial swelling.   Eyes: Negative for photophobia, pain, redness and visual disturbance.  Respiratory: Negative for shortness of breath.   Cardiovascular: Negative for chest pain.  Gastrointestinal: Negative for abdominal pain.  Genitourinary: Negative for dysuria.  Skin: Negative for rash.  Neurological: Negative for focal weakness, weakness, numbness and headaches.  Psychiatric/Behavioral: Negative for agitation.  All other systems reviewed and are negative.    Physical Exam Updated Vital Signs BP 122/77 (BP Location: Right Arm)    Pulse (!) 55    Temp 97.6 F (36.4 C) (Oral)    Resp 18    Ht 5' 8.5" (1.74 m)    Wt 65.8 kg    SpO2 97%    BMI 21.73 kg/m   Physical Exam Constitutional:      Appearance: She is normal weight.  HENT:     Head:      Left Ear: Tympanic membrane normal.     Nose: Nose normal.     Mouth/Throat:     Mouth: Mucous membranes are moist.     Pharynx: Oropharynx is clear.  Eyes:     Extraocular Movements: Extraocular movements intact.     Conjunctiva/sclera: Conjunctivae normal.     Pupils: Pupils are equal, round, and reactive to light.     Comments: No proptosis  Neck:     Musculoskeletal: Normal range of motion and neck supple.  Cardiovascular:     Rate and Rhythm: Normal rate and regular rhythm.     Pulses: Normal pulses.     Heart sounds: Normal heart sounds.  Pulmonary:     Effort: Pulmonary effort is normal.     Breath  sounds: Normal breath sounds.  Abdominal:     General: Abdomen is flat. Bowel sounds are normal.     Tenderness: There is no abdominal tenderness. There is no guarding.  Musculoskeletal: Normal range of motion.  Skin:    General: Skin is warm and dry.     Capillary Refill: Capillary refill takes less than 2 seconds.  Neurological:     General: No  focal deficit present.     Mental Status: She is alert.     Deep Tendon Reflexes: Reflexes normal.  Psychiatric:        Mood and Affect: Mood normal.      ED Treatments / Results  Labs (all labs ordered are listed, but only abnormal results are displayed) Results for orders placed or performed during the hospital encounter of 02/04/19  SARS Coronavirus 2 [facility] order, Performed in [place] hospital lab) Nasopharyngeal Nasopharyngeal Swab   Specimen: Nasopharyngeal Swab  Result Value Ref Range   SARS Coronavirus 2 NEGATIVE NEGATIVE  CBC with Differential/Platelet  Result Value Ref Range   WBC 6.5 4.0 - 10.5 K/uL   RBC 3.95 3.87 - 5.11 MIL/uL   Hemoglobin 12.1 12.0 - 15.0 g/dL   HCT 36.0 36.0 - 46.0 %   MCV 91.1 80.0 - 100.0 fL   MCH 30.6 26.0 - 34.0 pg   MCHC 33.6 30.0 - 36.0 g/dL   RDW 15.0 11.5 - 15.5 %   Platelets 244 150 - 400 K/uL   nRBC 0.0 0.0 - 0.2 %   Neutrophils Relative % 32 %   Neutro Abs 2.2 1.7 - 7.7 K/uL   Lymphocytes Relative 52 %   Lymphs Abs 3.5 0.7 - 4.0 K/uL   Monocytes Relative 14 %   Monocytes Absolute 0.9 0.1 - 1.0 K/uL   Eosinophils Relative 2 %   Eosinophils Absolute 0.1 0.0 - 0.5 K/uL   Basophils Relative 0 %   Basophils Absolute 0.0 0.0 - 0.1 K/uL   Immature Granulocytes 0 %   Abs Immature Granulocytes 0.01 0.00 - 0.07 K/uL  I-stat chem 8, ED (not at [facility] or [facility])  Result Value Ref Range   Sodium 137 135 - 145 mmol/L   Potassium 3.6 3.5 - 5.1 mmol/L   Chloride 102 98 - 111 mmol/L   BUN 16 8 - 23 mg/dL   Creatinine, Ser 0.70 0.44 - 1.00 mg/dL   Glucose, Bld 104 (H) 70 - 99 mg/dL    Calcium, Ion 1.02 (L) 1.15 - 1.40 mmol/L   TCO2 26 22 - 32 mmol/L   Hemoglobin 13.3 12.0 - 15.0 g/dL   HCT 39.0 36.0 - 46.0 %   Ct Head Wo Contrast  Result Date: 02/04/2019 CLINICAL DATA:  Fall down 13 steps. Right periorbital hematoma and laceration. Initial encounter EXAM: CT HEAD WITHOUT CONTRAST CT MAXILLOFACIAL WITHOUT CONTRAST CT CERVICAL SPINE WITHOUT CONTRAST TECHNIQUE: Multidetector CT imaging of the head, cervical spine, and maxillofacial structures were performed using the standard protocol without intravenous contrast. Multiplanar CT image reconstructions of the cervical spine and maxillofacial structures were also generated. COMPARISON:  None. FINDINGS: CT HEAD FINDINGS Brain: No acute infarct, hemorrhage, or mass lesion is present. Hyperdensity globus pallidus bilaterally likely reflects chronic calcification. The ventricles are of normal size. White matter is within normal limits for age. No significant extra-axial hemorrhage is present. Vascular: No hyperdense vessel or unexpected calcification. Skull: A left parietal scalp hematoma is present. There is no underlying fracture. No foreign body is present. Right supraorbital hematoma laceration is present. There is no underlying fracture. CT MAXILLOFACIAL FINDINGS Osseous: No acute facial fractures are present. Nasal bones are intact. Orbital rim is within normal limits. The mandible is intact and located. Orbits: Right periorbital hematoma is present. Hemorrhage in stranding extends into the superior aspect of the orbit without underlying fracture. Medial and superior walls are intact. Globes are intact. Lenses are located. Extraocular muscles are normal. Sinuses:  Clear Soft tissues: Right periorbital soft tissue hematoma and laceration. Soft tissues are otherwise. CT CERVICAL SPINE FINDINGS Alignment: There straightening of normal cervical lordosis. Slight degenerative retrolisthesis is present at C5-6. Minimal degenerative anterolisthesis is  present at C6-7. No other significant listhesis is present. Skull base and vertebrae: Craniocervical junction is normal. Vertebral body heights are maintained. No acute or healing fractures are present. Soft tissues and spinal canal: No prevertebral fluid or swelling. No visible canal hematoma. Disc levels: Central cervical disc protrusion is present at C3-4. Right greater than left uncovertebral and facet disease is present at C4-5 and C5-6 with associated narrowing. Upper chest: The lung apices are clear without focal nodule, mass, or airspace disease. Thoracic inlet is normal. There is mild dilation of the esophagus. Atherosclerotic changes are noted at the aortic arch great vessel origins. IMPRESSION: 1. Right periorbital hematoma and laceration. Hemorrhage and inflammatory changes extend into the superior orbit. There is no intraconal invasion. 2. Right globe is intact. 3. Left parietal scalp hematoma without underlying fracture. 4. No acute intracranial abnormality. CT appearance of the brain is within normal limits for age. 5. No acute facial fracture. 6. Degenerative changes of the cervical spine without acute fracture or traumatic subluxation. 7. Right greater than left foraminal narrowing at C4-5 and C5-6. 8.  Aortic Atherosclerosis (ICD10-I70.0). Electronically Signed   By: San Morelle M.D.   On: 02/04/2019 06:11   Ct Chest W Contrast  Result Date: 02/04/2019 CLINICAL DATA:  Fall. EXAM: CT CHEST, ABDOMEN, AND PELVIS WITH CONTRAST TECHNIQUE: Multidetector CT imaging of the chest, abdomen and pelvis was performed following the standard protocol during bolus administration of intravenous contrast. CONTRAST:  186mL OMNIPAQUE IOHEXOL 300 MG/ML  SOLN COMPARISON:  None. FINDINGS: CT CHEST FINDINGS Cardiovascular: No significant vascular findings. Normal heart size. No pericardial effusion. Mediastinum/Nodes: No enlarged mediastinal, hilar, or axillary lymph nodes. Thyroid gland, trachea, and  esophagus demonstrate no significant findings. Lungs/Pleura: Lungs are clear. No pleural effusion or pneumothorax. Musculoskeletal: No chest wall mass or suspicious bone lesions identified. CT ABDOMEN PELVIS FINDINGS Hepatobiliary: No focal liver abnormality is seen. No gallstones, gallbladder wall thickening, or biliary dilatation. Pancreas: Unremarkable. No pancreatic ductal dilatation or surrounding inflammatory changes. Spleen: Normal in size without focal abnormality. Adrenals/Urinary Tract: Adrenal glands are unremarkable. Kidneys are normal, without renal calculi, focal lesion, or hydronephrosis. Bladder is unremarkable. Stomach/Bowel: Stomach is within normal limits. Appendix appears normal. No evidence of bowel wall thickening, distention, or inflammatory changes. Vascular/Lymphatic: Aortic atherosclerosis. No enlarged abdominal or pelvic lymph nodes. Reproductive: Status post hysterectomy. No adnexal masses. Other: No abdominal wall hernia or abnormality. No abdominopelvic ascites. Musculoskeletal: No acute or significant osseous findings. IMPRESSION: No significant abnormality seen in the chest, abdomen or pelvis. Aortic Atherosclerosis (ICD10-I70.0). Electronically Signed   By: Marijo Conception M.D.   On: 02/04/2019 06:17   Ct Cervical Spine Wo Contrast  Result Date: 02/04/2019 CLINICAL DATA:  Fall down 13 steps. Right periorbital hematoma and laceration. Initial encounter EXAM: CT HEAD WITHOUT CONTRAST CT MAXILLOFACIAL WITHOUT CONTRAST CT CERVICAL SPINE WITHOUT CONTRAST TECHNIQUE: Multidetector CT imaging of the head, cervical spine, and maxillofacial structures were performed using the standard protocol without intravenous contrast. Multiplanar CT image reconstructions of the cervical spine and maxillofacial structures were also generated. COMPARISON:  None. FINDINGS: CT HEAD FINDINGS Brain: No acute infarct, hemorrhage, or mass lesion is present. Hyperdensity globus pallidus bilaterally likely  reflects chronic calcification. The ventricles are of normal size. White matter is within normal limits for  age. No significant extra-axial hemorrhage is present. Vascular: No hyperdense vessel or unexpected calcification. Skull: A left parietal scalp hematoma is present. There is no underlying fracture. No foreign body is present. Right supraorbital hematoma laceration is present. There is no underlying fracture. CT MAXILLOFACIAL FINDINGS Osseous: No acute facial fractures are present. Nasal bones are intact. Orbital rim is within normal limits. The mandible is intact and located. Orbits: Right periorbital hematoma is present. Hemorrhage in stranding extends into the superior aspect of the orbit without underlying fracture. Medial and superior walls are intact. Globes are intact. Lenses are located. Extraocular muscles are normal. Sinuses: Clear Soft tissues: Right periorbital soft tissue hematoma and laceration. Soft tissues are otherwise. CT CERVICAL SPINE FINDINGS Alignment: There straightening of normal cervical lordosis. Slight degenerative retrolisthesis is present at C5-6. Minimal degenerative anterolisthesis is present at C6-7. No other significant listhesis is present. Skull base and vertebrae: Craniocervical junction is normal. Vertebral body heights are maintained. No acute or healing fractures are present. Soft tissues and spinal canal: No prevertebral fluid or swelling. No visible canal hematoma. Disc levels: Central cervical disc protrusion is present at C3-4. Right greater than left uncovertebral and facet disease is present at C4-5 and C5-6 with associated narrowing. Upper chest: The lung apices are clear without focal nodule, mass, or airspace disease. Thoracic inlet is normal. There is mild dilation of the esophagus. Atherosclerotic changes are noted at the aortic arch great vessel origins. IMPRESSION: 1. Right periorbital hematoma and laceration. Hemorrhage and inflammatory changes extend into  the superior orbit. There is no intraconal invasion. 2. Right globe is intact. 3. Left parietal scalp hematoma without underlying fracture. 4. No acute intracranial abnormality. CT appearance of the brain is within normal limits for age. 5. No acute facial fracture. 6. Degenerative changes of the cervical spine without acute fracture or traumatic subluxation. 7. Right greater than left foraminal narrowing at C4-5 and C5-6. 8.  Aortic Atherosclerosis (ICD10-I70.0). Electronically Signed   By: San Morelle M.D.   On: 02/04/2019 06:11   Ct Abdomen Pelvis W Contrast  Result Date: 02/04/2019 CLINICAL DATA:  Fall. EXAM: CT CHEST, ABDOMEN, AND PELVIS WITH CONTRAST TECHNIQUE: Multidetector CT imaging of the chest, abdomen and pelvis was performed following the standard protocol during bolus administration of intravenous contrast. CONTRAST:  171mL OMNIPAQUE IOHEXOL 300 MG/ML  SOLN COMPARISON:  None. FINDINGS: CT CHEST FINDINGS Cardiovascular: No significant vascular findings. Normal heart size. No pericardial effusion. Mediastinum/Nodes: No enlarged mediastinal, hilar, or axillary lymph nodes. Thyroid gland, trachea, and esophagus demonstrate no significant findings. Lungs/Pleura: Lungs are clear. No pleural effusion or pneumothorax. Musculoskeletal: No chest wall mass or suspicious bone lesions identified. CT ABDOMEN PELVIS FINDINGS Hepatobiliary: No focal liver abnormality is seen. No gallstones, gallbladder wall thickening, or biliary dilatation. Pancreas: Unremarkable. No pancreatic ductal dilatation or surrounding inflammatory changes. Spleen: Normal in size without focal abnormality. Adrenals/Urinary Tract: Adrenal glands are unremarkable. Kidneys are normal, without renal calculi, focal lesion, or hydronephrosis. Bladder is unremarkable. Stomach/Bowel: Stomach is within normal limits. Appendix appears normal. No evidence of bowel wall thickening, distention, or inflammatory changes. Vascular/Lymphatic:  Aortic atherosclerosis. No enlarged abdominal or pelvic lymph nodes. Reproductive: Status post hysterectomy. No adnexal masses. Other: No abdominal wall hernia or abnormality. No abdominopelvic ascites. Musculoskeletal: No acute or significant osseous findings. IMPRESSION: No significant abnormality seen in the chest, abdomen or pelvis. Aortic Atherosclerosis (ICD10-I70.0). Electronically Signed   By: Marijo Conception M.D.   On: 02/04/2019 06:17   Ct Maxillofacial Wo Contrast  Result Date: 02/04/2019 CLINICAL DATA:  Fall down 13 steps. Right periorbital hematoma and laceration. Initial encounter EXAM: CT HEAD WITHOUT CONTRAST CT MAXILLOFACIAL WITHOUT CONTRAST CT CERVICAL SPINE WITHOUT CONTRAST TECHNIQUE: Multidetector CT imaging of the head, cervical spine, and maxillofacial structures were performed using the standard protocol without intravenous contrast. Multiplanar CT image reconstructions of the cervical spine and maxillofacial structures were also generated. COMPARISON:  None. FINDINGS: CT HEAD FINDINGS Brain: No acute infarct, hemorrhage, or mass lesion is present. Hyperdensity globus pallidus bilaterally likely reflects chronic calcification. The ventricles are of normal size. White matter is within normal limits for age. No significant extra-axial hemorrhage is present. Vascular: No hyperdense vessel or unexpected calcification. Skull: A left parietal scalp hematoma is present. There is no underlying fracture. No foreign body is present. Right supraorbital hematoma laceration is present. There is no underlying fracture. CT MAXILLOFACIAL FINDINGS Osseous: No acute facial fractures are present. Nasal bones are intact. Orbital rim is within normal limits. The mandible is intact and located. Orbits: Right periorbital hematoma is present. Hemorrhage in stranding extends into the superior aspect of the orbit without underlying fracture. Medial and superior walls are intact. Globes are intact. Lenses are  located. Extraocular muscles are normal. Sinuses: Clear Soft tissues: Right periorbital soft tissue hematoma and laceration. Soft tissues are otherwise. CT CERVICAL SPINE FINDINGS Alignment: There straightening of normal cervical lordosis. Slight degenerative retrolisthesis is present at C5-6. Minimal degenerative anterolisthesis is present at C6-7. No other significant listhesis is present. Skull base and vertebrae: Craniocervical junction is normal. Vertebral body heights are maintained. No acute or healing fractures are present. Soft tissues and spinal canal: No prevertebral fluid or swelling. No visible canal hematoma. Disc levels: Central cervical disc protrusion is present at C3-4. Right greater than left uncovertebral and facet disease is present at C4-5 and C5-6 with associated narrowing. Upper chest: The lung apices are clear without focal nodule, mass, or airspace disease. Thoracic inlet is normal. There is mild dilation of the esophagus. Atherosclerotic changes are noted at the aortic arch great vessel origins. IMPRESSION: 1. Right periorbital hematoma and laceration. Hemorrhage and inflammatory changes extend into the superior orbit. There is no intraconal invasion. 2. Right globe is intact. 3. Left parietal scalp hematoma without underlying fracture. 4. No acute intracranial abnormality. CT appearance of the brain is within normal limits for age. 5. No acute facial fracture. 6. Degenerative changes of the cervical spine without acute fracture or traumatic subluxation. 7. Right greater than left foraminal narrowing at C4-5 and C5-6. 8.  Aortic Atherosclerosis (ICD10-I70.0). Electronically Signed   By: San Morelle M.D.   On: 02/04/2019 06:11   Procedures Procedures (including critical care time)  Medications Ordered in ED Medications  Tdap (BOOSTRIX) injection 0.5 mL (has no administration in time range)    Case d/w Dr. Alanda Slim of ophthalmology.  He does not repair these please call  ENT  [phone] case d/w Dr. Constance Holster of ENT on for facial. He does not repair these.  Call oculoplastics   Case d/w Dr. Zachery Dakins transfer to the ED  Case D/w Dr. Ephraim Hamburger who will accept  Tricia Woodward was evaluated in Emergency Department on 02/04/2019 for the symptoms described in the history of present illness. She was evaluated in the context of the global COVID-19 pandemic, which necessitated consideration that the patient might be at risk for infection with the SARS-CoV-2 virus that causes COVID-19. Institutional protocols and algorithms that pertain to the evaluation of patients at risk for  COVID-19 are in a state of rapid change based on information released by regulatory bodies including the CDC and federal and state organizations. These policies and algorithms were followed during the patient's care in the ED.  Final Clinical Impressions(s) / ED Diagnoses   Eyelid hematoma and laceration of the lid through the lateral canthus     Liandra Mendia, MD 02/04/19 [phone]

## 2019-02-04 NOTE — ED Notes (Signed)
All belongings given to sister , [place].  , patient has a silver colored bracklet on her right arm didn't want to take off.

## 2019-02-04 NOTE — ED Notes (Signed)
Ice pack applied to right eye, sister at bedside.

## 2019-02-04 NOTE — ED Notes (Signed)
[facility] transport called °

## 2019-02-04 NOTE — ED Notes (Signed)
Updated family on pt status via telephone per pt request. Spoke with pts daughter Clyde Lundborg [phone]); provided contact number for [facility]. Daughter is currently out of town. Pt sister Thersa Salt [phone]) planning to visit pt in [facility] ED after transfer.

## 2019-02-04 NOTE — ED Triage Notes (Signed)
Pt brought to ED by [facility]. Pt staying with granddaughter for the weekend. Pt reports waking up to use the restroom and  falling down 13 steps after losing her footing in the dark. Hematoma to right eye with laceration to right upper lid. No visual impairment. No other obvious injuries noted. Pt Complains of nausea, no vomiting. Pt is A&O x4.

## 2019-04-12 ENCOUNTER — Other Ambulatory Visit: Payer: Self-pay | Admitting: Family Medicine

## 2019-04-12 ENCOUNTER — Other Ambulatory Visit: Payer: Self-pay | Admitting: Endocrinology

## 2019-04-12 DIAGNOSIS — E890 Postprocedural hypothyroidism: Secondary | ICD-10-CM

## 2019-04-12 DIAGNOSIS — E209 Hypoparathyroidism, unspecified: Secondary | ICD-10-CM

## 2019-04-15 ENCOUNTER — Other Ambulatory Visit: Payer: Self-pay | Admitting: Endocrinology

## 2019-05-18 ENCOUNTER — Encounter: Payer: Self-pay | Admitting: Family Medicine

## 2019-06-07 ENCOUNTER — Encounter: Payer: Self-pay | Admitting: Endocrinology

## 2019-06-16 ENCOUNTER — Encounter: Payer: Self-pay | Admitting: Endocrinology

## 2019-06-20 ENCOUNTER — Encounter: Payer: Self-pay | Admitting: Family Medicine

## 2019-06-22 NOTE — Telephone Encounter (Signed)
Pt returned call, she will check with [place] for participating mammography facilities and let us know. Patient under the impression we should have this info, explained that we participate with [facility] but do not have specific coverage information.

## 2019-06-27 ENCOUNTER — Encounter: Payer: Self-pay | Admitting: Family Medicine

## 2019-06-27 ENCOUNTER — Other Ambulatory Visit: Payer: Self-pay

## 2019-06-27 ENCOUNTER — Ambulatory Visit (INDEPENDENT_AMBULATORY_CARE_PROVIDER_SITE_OTHER): Payer: 59 | Admitting: Family Medicine

## 2019-06-27 ENCOUNTER — Other Ambulatory Visit: Payer: Self-pay | Admitting: Family Medicine

## 2019-06-27 VITALS — BP 106/62 | HR 78 | Temp 97.9°F | Resp 16 | Ht 68.0 in | Wt 146.5 lb

## 2019-06-27 DIAGNOSIS — F17200 Nicotine dependence, unspecified, uncomplicated: Secondary | ICD-10-CM

## 2019-06-27 DIAGNOSIS — E890 Postprocedural hypothyroidism: Secondary | ICD-10-CM

## 2019-06-27 DIAGNOSIS — E209 Hypoparathyroidism, unspecified: Secondary | ICD-10-CM

## 2019-06-27 DIAGNOSIS — Z1231 Encounter for screening mammogram for malignant neoplasm of breast: Secondary | ICD-10-CM

## 2019-06-27 DIAGNOSIS — E785 Hyperlipidemia, unspecified: Secondary | ICD-10-CM

## 2019-06-27 DIAGNOSIS — R7303 Prediabetes: Secondary | ICD-10-CM

## 2019-06-27 NOTE — Progress Notes (Signed)
   Subjective:    Patient ID: Tricia Woodward, female    DOB: Oct 21, 1955, 64 y.o.   MRN: [id number]  HPI Hypothyroid- chronic problem, on Euthyrox 38mcg BID.  + fatigue.  No changes to skin/hair/nails.  Hypoparathyroid- chronic problem, on Calcitriol 1.5mg  daily per Dr Loanne Drilling.  Pt reports 'i'm tired, i'm aching'.  Hyperlipidemia- chronic problem, on Crestor 20mg  daily.  Denies abd pain, N/V.  [facility].  Tobacco Abuse- continues to smoke a 1/2 ppd.  Trying to cut back.  Prediabetes- A1C 6.2 last month   Review of Systems For ROS see HPI   This visit occurred during the SARS-CoV-2 public health emergency.  Safety protocols were in place, including screening questions prior to the visit, additional usage of staff PPE, and extensive cleaning of exam room while observing appropriate contact time as indicated for disinfecting solutions.       Objective:   Physical Exam Vitals reviewed.  Constitutional:      General: She is not in acute distress.    Appearance: She is well-developed.  HENT:     Head: Normocephalic and atraumatic.  Eyes:     Conjunctiva/sclera: Conjunctivae normal.     Pupils: Pupils are equal, round, and reactive to light.  Neck:     Thyroid: No thyromegaly.  Cardiovascular:     Rate and Rhythm: Normal rate and regular rhythm.     Heart sounds: Normal heart sounds. No murmur.  Pulmonary:     Effort: Pulmonary effort is normal. No respiratory distress.     Breath sounds: Normal breath sounds.  Abdominal:     General: There is no distension.     Palpations: Abdomen is soft.     Tenderness: There is no abdominal tenderness.  Musculoskeletal:     Cervical back: Normal range of motion and neck supple.  Lymphadenopathy:     Cervical: No cervical adenopathy.  Skin:    General: Skin is warm and dry.  Neurological:     Mental Status: She is alert and oriented to person, place, and time.  Psychiatric:        Behavior: Behavior normal.            Assessment & Plan:

## 2019-06-27 NOTE — Patient Instructions (Signed)
Schedule your complete physical in 6 months We'll notify you of your lab results and make any changes if needed Continue to work on healthy diet and regular exercise- you can do it! Continue to cut back on smoking- you can do it! Call with any questions or concerns Stay Safe!  Stay Healthy!

## 2019-06-28 ENCOUNTER — Other Ambulatory Visit: Payer: Self-pay | Admitting: Family Medicine

## 2019-06-28 ENCOUNTER — Encounter: Payer: Self-pay | Admitting: Family Medicine

## 2019-06-28 DIAGNOSIS — R7989 Other specified abnormal findings of blood chemistry: Secondary | ICD-10-CM

## 2019-06-28 LAB — BASIC METABOLIC PANEL
BUN/Creatinine Ratio: 17 (ref 12–28)
BUN: 13 mg/dL (ref 8–27)
CO2: 29 mmol/L (ref 20–29)
Calcium: 8.6 mg/dL — ABNORMAL LOW (ref 8.7–10.3)
Chloride: 100 mmol/L (ref 96–106)
Creatinine, Ser: 0.76 mg/dL (ref 0.57–1.00)
GFR calc Af Amer: 96 mL/min/{1.73_m2} (ref >59)
GFR calc non Af Amer: 83 mL/min/{1.73_m2} (ref >59)
Glucose: 73 mg/dL (ref 65–99)
Potassium: 3.8 mmol/L (ref 3.5–5.2)
Sodium: 141 mmol/L (ref 134–144)

## 2019-06-28 LAB — HEPATIC FUNCTION PANEL
ALT: 113 IU/L — ABNORMAL HIGH (ref 0–32)
AST: 101 IU/L — ABNORMAL HIGH (ref 0–40)
Albumin: 3.9 g/dL (ref 3.8–4.8)
Alkaline Phosphatase: 106 IU/L (ref 39–117)
Bilirubin Total: 0.4 mg/dL (ref 0.0–1.2)
Bilirubin, Direct: 0.18 mg/dL (ref 0.00–0.40)
Total Protein: 7.3 g/dL (ref 6.0–8.5)

## 2019-06-28 LAB — CBC WITH DIFFERENTIAL/PLATELET
Basophils Absolute: 0 10*3/uL (ref 0.0–0.2)
Basos: 1 %
EOS (ABSOLUTE): 0.1 10*3/uL (ref 0.0–0.4)
Eos: 1 %
Hematocrit: 38.9 % (ref 34.0–46.6)
Hemoglobin: 13.4 g/dL (ref 11.1–15.9)
Immature Grans (Abs): 0 10*3/uL (ref 0.0–0.1)
Immature Granulocytes: 0 %
Lymphocytes Absolute: 2.6 10*3/uL (ref 0.7–3.1)
Lymphs: 50 %
MCH: 30.6 pg (ref 26.6–33.0)
MCHC: 34.4 g/dL (ref 31.5–35.7)
MCV: 89 fL (ref 79–97)
Monocytes Absolute: 0.6 10*3/uL (ref 0.1–0.9)
Monocytes: 11 %
Neutrophils Absolute: 2 10*3/uL (ref 1.4–7.0)
Neutrophils: 37 %
Platelets: 303 10*3/uL (ref 150–450)
RBC: 4.38 x10E6/uL (ref 3.77–5.28)
RDW: 13.8 % (ref 11.7–15.4)
WBC: 5.3 10*3/uL (ref 3.4–10.8)

## 2019-06-28 LAB — LIPID PANEL
Chol/HDL Ratio: 1.5 ratio (ref 0.0–4.4)
Cholesterol, Total: 227 mg/dL — ABNORMAL HIGH (ref 100–199)
HDL: 149 mg/dL (ref >39)
LDL Chol Calc (NIH): 67 mg/dL (ref 0–99)
Triglycerides: 65 mg/dL (ref 0–149)
VLDL Cholesterol Cal: 11 mg/dL (ref 5–40)

## 2019-06-28 LAB — VITAMIN D 25 HYDROXY (VIT D DEFICIENCY, FRACTURES): Vit D, 25-Hydroxy: 42.8 ng/mL (ref 30.0–100.0)

## 2019-06-28 LAB — TSH: TSH: 0.428 u[IU]/mL — ABNORMAL LOW (ref 0.450–4.500)

## 2019-06-28 LAB — PTH, INTACT AND CALCIUM: PTH: 13 pg/mL — ABNORMAL LOW (ref 15–65)

## 2019-06-29 ENCOUNTER — Encounter: Payer: Self-pay | Admitting: Family Medicine

## 2019-07-13 ENCOUNTER — Other Ambulatory Visit: Payer: Self-pay

## 2019-07-13 ENCOUNTER — Ambulatory Visit (INDEPENDENT_AMBULATORY_CARE_PROVIDER_SITE_OTHER): Payer: 59

## 2019-07-13 DIAGNOSIS — R7989 Other specified abnormal findings of blood chemistry: Secondary | ICD-10-CM

## 2019-07-14 ENCOUNTER — Encounter: Payer: Self-pay | Admitting: Family Medicine

## 2019-07-14 LAB — HEPATIC FUNCTION PANEL
ALT: 67 IU/L — ABNORMAL HIGH (ref 0–32)
AST: 65 IU/L — ABNORMAL HIGH (ref 0–40)
Albumin: 4.2 g/dL (ref 3.8–4.8)
Alkaline Phosphatase: 80 IU/L (ref 39–117)
Bilirubin Total: 0.3 mg/dL (ref 0.0–1.2)
Bilirubin, Direct: 0.15 mg/dL (ref 0.00–0.40)
Total Protein: 7.5 g/dL (ref 6.0–8.5)

## 2019-07-15 ENCOUNTER — Other Ambulatory Visit: Payer: Self-pay | Admitting: Family Medicine

## 2019-07-15 ENCOUNTER — Telehealth: Payer: Self-pay | Admitting: Family Medicine

## 2019-07-15 DIAGNOSIS — E890 Postprocedural hypothyroidism: Secondary | ICD-10-CM

## 2019-07-15 DIAGNOSIS — E209 Hypoparathyroidism, unspecified: Secondary | ICD-10-CM

## 2019-07-15 NOTE — Telephone Encounter (Signed)
Pt requests refill of Euthyrox to [facility] on [address]

## 2019-07-15 NOTE — Telephone Encounter (Signed)
Medication filled to pharmacy as requested.   

## 2019-07-17 NOTE — Assessment & Plan Note (Signed)
Chronic problem.  Tolerating statin w/o difficulty.  Check labs.  Adjust meds prn  

## 2019-07-17 NOTE — Assessment & Plan Note (Signed)
Chronic problem.  Is going to re-establish w/ Endo.  Will check labs prior to upcoming appt.

## 2019-07-17 NOTE — Assessment & Plan Note (Signed)
New.  A1C last month was 6.2  Stressed need for low carb diet and regular exercise.  Will continue to follow.

## 2019-07-17 NOTE — Assessment & Plan Note (Signed)
Ongoing issue.  She continues to smoke at least a 1/2 ppd.  She states she is trying to cut back.  Stressed the importance of this and the impact it would have on her overall health.  Will follow.

## 2019-07-17 NOTE — Assessment & Plan Note (Signed)
Chronic problem.  Ongoing fatigue but she has had this previously and independent of her thyroid condition.  Check labs.  Adjust meds prn

## 2019-07-18 ENCOUNTER — Encounter: Payer: Self-pay | Admitting: Family Medicine

## 2019-07-22 ENCOUNTER — Other Ambulatory Visit: Payer: Self-pay | Admitting: Family Medicine

## 2019-07-22 NOTE — Telephone Encounter (Signed)
Pt called requesting refill of Tramadol to [facility]

## 2019-07-22 NOTE — Telephone Encounter (Signed)
Last oV 06/27/19 Tramadol last filled 01/18/18 #60 with 0

## 2019-07-22 NOTE — Telephone Encounter (Signed)
Duplicate

## 2019-07-22 NOTE — Telephone Encounter (Signed)
Already done

## 2019-08-05 ENCOUNTER — Other Ambulatory Visit: Payer: Self-pay

## 2019-08-05 ENCOUNTER — Ambulatory Visit
Admission: RE | Admit: 2019-08-05 | Discharge: 2019-08-05 | Disposition: A | Discharge status: Home / Self Care | Payer: 59 | Source: Ambulatory Visit | Attending: Family Medicine | Admitting: Family Medicine

## 2019-08-05 DIAGNOSIS — Z1231 Encounter for screening mammogram for malignant neoplasm of breast: Secondary | ICD-10-CM

## 2019-08-15 ENCOUNTER — Other Ambulatory Visit: Payer: Self-pay | Admitting: General Practice

## 2019-08-15 ENCOUNTER — Ambulatory Visit (INDEPENDENT_AMBULATORY_CARE_PROVIDER_SITE_OTHER): Payer: 59 | Admitting: Family Medicine

## 2019-08-15 ENCOUNTER — Encounter: Payer: Self-pay | Admitting: Family Medicine

## 2019-08-15 ENCOUNTER — Other Ambulatory Visit: Payer: Self-pay

## 2019-08-15 DIAGNOSIS — R42 Dizziness and giddiness: Secondary | ICD-10-CM

## 2019-08-15 DIAGNOSIS — I959 Hypotension, unspecified: Secondary | ICD-10-CM | POA: Diagnosis not present

## 2019-08-15 MED ORDER — MECLIZINE HCL 50 MG PO TABS: 50.0000 mg | ORAL_TABLET | Freq: Three times a day (TID) | ORAL | 0 refills | Status: DC | PRN

## 2019-08-15 MED ORDER — MECLIZINE HCL 25 MG PO TABS: 25.0000 mg | ORAL_TABLET | Freq: Three times a day (TID) | ORAL | 0 refills | Status: DC | PRN

## 2019-08-15 NOTE — Telephone Encounter (Signed)
Pt called and scheduled

## 2019-08-15 NOTE — Progress Notes (Signed)
Virtual Visit via Video   I connected with patient on 08/15/19 at  3:00 PM EST by a video enabled telemedicine application and verified that I am speaking with the correct person using two identifiers.  Location patient: Home Location provider: Acupuncturist, Office Persons participating in the virtual visit: Patient, Provider, [place] (Jess B)  I discussed the limitations of evaluation and management by telemedicine and the availability of in person appointments. The patient expressed understanding and agreed to proceed.  Subjective:   HPI:   Dizziness- pt sat up quickly when she woke this AM and was dizzy.  Pt 'had to hold on to go to the toilet.  The whole room was moving'.  While using the restroom 'had a bad hot flash'.  Was able to get back to bed and after sleeping for a few hours felt better.  Ate lunch.  'i'm a little puny but i'm not sick'.  No nausea.  No hx of vertigo.    ROS:   See pertinent positives and negatives per HPI.  Patient Active Problem List   Diagnosis Date Noted  . Pre-diabetes 06/27/2019  . Numbness 07/21/2018  . Status post carpal tunnel release 04/17/2016  . Physical exam 05/03/2015  . OAB (overactive bladder) 11/02/2014  . Post-surgical hypothyroidism 10/10/2014  . Visual loss 10/10/2014  . Hypotension 10/10/2014  . Chronic back pain 09/07/2013  . Paresthesia of hand, bilateral 09/07/2013  . Recurrent falls 01/17/2013  . TOBACCO ABUSE 05/15/2010  . GOITER 05/14/2010  . Hyperlipidemia 05/14/2010  . HYPOCALCEMIA 05/14/2010  . HYPOPARATHYROIDISM 03/14/2010    Social History   Tobacco Use  . Smoking status: Heavy Tobacco Smoker    Packs/day: 0.75    Types: Cigarettes    Start date: 12/16/1978  . Smokeless tobacco: Current User  . Tobacco comment: e cigs at times  Substance Use Topics  . Alcohol use: Yes    Alcohol/week: 18.0 standard drinks    Types: 6 Cans of beer, 6 Shots of liquor, 6 Standard drinks or equivalent per week   Comment: 1-2 drinks three times a week     Current Outpatient Medications:  .  aspirin 81 MG tablet, Take 81 mg by mouth daily. , Disp: , Rfl:  .  calcitRIOL (ROCALTROL) 0.5 MCG capsule, Take 3 capsules (1.5 mcg total) by mouth daily., Disp: 90 capsule, Rfl: 11 .  cholecalciferol (VITAMIN D3) 25 MCG (1000 UT) tablet, Take 1,000 Units by mouth daily., Disp: , Rfl:  .  diclofenac sodium (VOLTAREN) 1 % GEL, Apply 2 g topically 4 (four) times daily., Disp: 1 Tube, Rfl: 5 .  EUTHYROX 50 MCG tablet, Take 1 tablet by mouth twice daily, Disp: 180 tablet, Rfl: 0 .  fish oil-omega-3 fatty acids 1000 MG capsule, Take 2 g by mouth daily. , Disp: , Rfl:  .  Multiple Vitamin (MULTIVITAMIN) tablet, Take 1 tablet by mouth daily., Disp: , Rfl:  .  rosuvastatin (CRESTOR) 20 MG tablet, Take 20 mg by mouth daily., Disp: , Rfl:  .  traMADol (ULTRAM) 50 MG tablet, Take 1 tablet by mouth twice daily as needed, Disp: 30 tablet, Rfl: 0 .  vitamin B-12 (CYANOCOBALAMIN) 1000 MCG tablet, Take 1,000 mcg by mouth daily., Disp: , Rfl:   Allergies  Allergen Reactions  . Penicillins     REACTION: Yeast Infections    Objective:   There were no vitals taken for this visit.  AAOx3, NAD NCAT, EOMI No obvious CN deficits Coloring WNL Pt is able  to speak clearly, coherently without shortness of breath or increased work of breathing.  Thought process is linear.  Mood is appropriate.   Assessment and Plan:   Dizziness- new.  Pt's episode this AM sounds like a combination of BPV upon waking and then a hypotensive/vagal episode when urinating (hot flash, nausea).  Sxs resolved upon going back to bed for a few hours.  Admits to limited water intake.  Encouraged increased water intake, changing positions slowly.  She is no longer having vertigo sxs but will send in Meclizine to use PRN.  Reviewed supportive care and red flags that should prompt return.  Pt expressed understanding and is in agreement w/ plan.    Annye Asa, MD 08/15/2019

## 2019-08-15 NOTE — Progress Notes (Signed)
I have discussed the procedure for the virtual visit with the patient who has given consent to proceed with assessment and treatment.   Pt unable to obtain vitals.   Ritchard Paragas L Aldine Chakraborty, CMA     

## 2019-09-07 ENCOUNTER — Ambulatory Visit: Payer: 59 | Admitting: Physician Assistant

## 2019-09-07 ENCOUNTER — Other Ambulatory Visit: Payer: Self-pay

## 2019-09-07 VITALS — BP 100/63 | HR 79 | Temp 98.7°F | Resp 18 | Ht 68.0 in | Wt 145.0 lb

## 2019-09-07 DIAGNOSIS — R7303 Prediabetes: Secondary | ICD-10-CM

## 2019-09-07 LAB — POCT GLYCOSYLATED HEMOGLOBIN (HGB A1C): Hemoglobin A1C: 5.2 % (ref 4.0–5.6)

## 2019-09-07 LAB — GLUCOSE, POCT (MANUAL RESULT ENTRY): POC Glucose: 92 mg/dl (ref 70–99)

## 2019-09-07 MED ORDER — ACCU-CHEK SOFTCLIX LANCETS MISC: 12 refills | Status: DC

## 2019-09-07 MED ORDER — ACCU-CHEK AVIVA PLUS VI STRP: ORAL_STRIP | 12 refills | Status: DC

## 2019-09-07 MED ORDER — BLOOD GLUCOSE METER KIT: PACK | 0 refills | Status: DC

## 2019-09-07 NOTE — Patient Instructions (Addendum)
Check blood sugars (fasting) on a daily basis, learn what foods / wines are better for your fasting blood sugars  Your A1C was 5.2   My Fitness Pal app    Mediterranean Diet A Mediterranean diet refers to food and lifestyle choices that are based on the traditions of countries located on the [employer]. This way of eating has been shown to help prevent certain conditions and improve outcomes for people who have chronic diseases, like kidney disease and heart disease. What are tips for following this plan? Lifestyle  Cook and eat meals together with your family, when possible.  Drink enough fluid to keep your urine clear or pale yellow.  Be physically active every day. This includes: ? Aerobic exercise like running or swimming. ? Leisure activities like gardening, walking, or housework.  Get 7-8 hours of sleep each night.  If recommended by your health care provider, drink red wine in moderation. This means 1 glass a day for nonpregnant women and 2 glasses a day for men. A glass of wine equals 5 oz (150 mL). Reading food labels   Check the serving size of packaged foods. For foods such as rice and pasta, the serving size refers to the amount of cooked product, not dry.  Check the total fat in packaged foods. Avoid foods that have saturated fat or trans fats.  Check the ingredients list for added sugars, such as corn syrup. Shopping  At the grocery store, buy most of your food from the areas near the walls of the store. This includes: ? Fresh fruits and vegetables (produce). ? Grains, beans, nuts, and seeds. Some of these may be available in unpackaged forms or large amounts (in bulk). ? Fresh seafood. ? Poultry and eggs. ? Low-fat dairy products.  Buy whole ingredients instead of prepackaged foods.  Buy fresh fruits and vegetables in-season from local farmers markets.  Buy frozen fruits and vegetables in resealable bags.  If you do not have access to quality fresh  seafood, buy precooked frozen shrimp or canned fish, such as tuna, salmon, or sardines.  Buy small amounts of raw or cooked vegetables, salads, or olives from the deli or salad bar at your store.  Stock your pantry so you always have certain foods on hand, such as olive oil, canned tuna, canned tomatoes, rice, pasta, and beans. Cooking  Cook foods with extra-virgin olive oil instead of using butter or other vegetable oils.  Have meat as a side dish, and have vegetables or grains as your main dish. This means having meat in small portions or adding small amounts of meat to foods like pasta or stew.  Use beans or vegetables instead of meat in common dishes like chili or lasagna.  Experiment with different cooking methods. Try roasting or broiling vegetables instead of steaming or sauteing them.  Add frozen vegetables to soups, stews, pasta, or rice.  Add nuts or seeds for added healthy fat at each meal. You can add these to yogurt, salads, or vegetable dishes.  Marinate fish or vegetables using olive oil, lemon juice, garlic, and fresh herbs. Meal planning   Plan to eat 1 vegetarian meal one day each week. Try to work up to 2 vegetarian meals, if possible.  Eat seafood 2 or more times a week.  Have healthy snacks readily available, such as: ? Vegetable sticks with hummus. ? [place] yogurt. ? Fruit and nut trail mix.  Eat balanced meals throughout the week. This includes: ? Fruit: 2-3 servings a  day ? Vegetables: 4-5 servings a day ? Low-fat dairy: 2 servings a day ? Fish, poultry, or lean meat: 1 serving a day ? Beans and legumes: 2 or more servings a week ? Nuts and seeds: 1-2 servings a day ? Whole grains: 6-8 servings a day ? Extra-virgin olive oil: 3-4 servings a day  Limit red meat and sweets to only a few servings a month What are my food choices?  Mediterranean diet ? Recommended  Grains: Whole-grain pasta. Brown rice. Bulgar wheat. Polenta. Couscous. Whole-wheat  bread. [place].  Vegetables: Artichokes. Beets. Broccoli. Cabbage. Carrots. Eggplant. Green beans. Chard. Kale. Spinach. Onions. Leeks. Peas. Squash. Tomatoes. Peppers. Radishes.  Fruits: Apples. Apricots. Avocado. Berries. Bananas. Cherries. Dates. Figs. Grapes. Lemons. Melon. Oranges. Peaches. Plums. Pomegranate.  Meats and other protein foods: Beans. Almonds. Sunflower seeds. Pine nuts. Peanuts. [place]. Salmon. Scallops. Shrimp. Carbon. Tilapia. Clams. Oysters. Eggs.  Dairy: Low-fat milk. Cheese. Greek yogurt.  Beverages: Water. Red wine. Herbal tea.  Fats and oils: Extra virgin olive oil. Avocado oil. Grape seed oil.  Sweets and desserts: [place] yogurt with honey. Baked apples. Poached pears. Trail mix.  Seasoning and other foods: Basil. Cilantro. Coriander. Cumin. Mint. Parsley. Sage. Rosemary. Tarragon. Garlic. Oregano. Thyme. Pepper. Balsalmic vinegar. Tahini. Hummus. Tomato sauce. Olives. Mushrooms. ? Limit these  Grains: Prepackaged pasta or rice dishes. Prepackaged cereal with added sugar.  Vegetables: Deep fried potatoes (french fries).  Fruits: Fruit canned in syrup.  Meats and other protein foods: Beef. Pork. Lamb. Poultry with skin. Hot dogs. Berniece Salines.  Dairy: Ice cream. Sour cream. Whole milk.  Beverages: Juice. Sugar-sweetened soft drinks. Beer. Liquor and spirits.  Fats and oils: Butter. Canola oil. Vegetable oil. Beef fat (tallow). Lard.  Sweets and desserts: Cookies. Cakes. Pies. Candy.  Seasoning and other foods: Mayonnaise. Premade sauces and marinades. The items listed may not be a complete list. Talk with your dietitian about what dietary choices are right for you. Summary  The Mediterranean diet includes both food and lifestyle choices.  Eat a variety of fresh fruits and vegetables, beans, nuts, seeds, and whole grains.  Limit the amount of red meat and sweets that you eat.  Talk with your health care provider about whether it is safe for you to  drink red wine in moderation. This means 1 glass a day for nonpregnant women and 2 glasses a day for men. A glass of wine equals 5 oz (150 mL). This information is not intended to replace advice given to you by your health care provider. Make sure you discuss any questions you have with your health care provider. Document Revised: 01/31/2016 Document Reviewed: 01/24/2016 Elsevier Patient Education  [place].

## 2019-09-07 NOTE — Progress Notes (Signed)
New Patient Office Visit  Subjective:  Patient ID: Tricia Woodward, female    DOB: [birth year]  Age: 64 y.o. MRN: [id number]  CC:  Chief Complaint  Patient presents with  . Diabetes    HPI Tricia Woodward reports that she was given the diagnosis of prediabetes by her primary care provider, states that she is concerned, does not have a glucose meter to check her blood sugars on a daily basis, has reduced the amount of alcohol she drinks, is currently drinking a couple glasses of wine a couple days a week.  Reports that she is mindful about her portions, but does endorse that she does not necessarily watch the types of foods that she is eating.  Reports she was previously told that she would have episodes of low blood sugars, and that she needed to eat small amounts   Past Medical History:  Diagnosis Date  . Allergy   . Arthritis   . Frequent falls   . Heart murmur   . Hemorrhoids    past hx   . Low blood sugar   . Thyroid disease     Past Surgical History:  Procedure Laterality Date  . ABDOMINAL HYSTERECTOMY     partial  . BREAST CYST EXCISION Bilateral 1983  . BUNIONECTOMY    . DENTAL SURGERY    . THYROID SURGERY Bilateral 1970    Family History  Problem Relation Age of Onset  . Hypertension Father   . Hypertension Sister   . Arthritis Other   . Hyperlipidemia Other   . Aneurysm Other   . Colon cancer Neg Hx   . Esophageal cancer Neg Hx   . Rectal cancer Neg Hx   . Stomach cancer Neg Hx   . Breast cancer Neg Hx     Social History   Socioeconomic History  . Marital status: Single    Spouse name: Not on file  . Number of children: Not on file  . Years of education: Not on file  . Highest education level: Not on file  Occupational History  . Not on file  Tobacco Use  . Smoking status: Heavy Tobacco Smoker    Packs/day: 0.50    Types: Cigarettes    Start date: 12/16/1978  . Smokeless tobacco: Current User  . Tobacco comment: e cigs at times   Substance and Sexual Activity  . Alcohol use: Yes    Alcohol/week: 16.0 standard drinks    Types: 6 Standard drinks or equivalent, 4 Cans of beer, 6 Glasses of wine per week    Comment: 1-2 drinks three times a week   . Drug use: No  . Sexual activity: Not Currently  Other Topics Concern  . Not on file  Social History Narrative  . Not on file   Social Determinants of Health   Financial Resource Strain:   . Difficulty of Paying Living Expenses:   Food Insecurity:   . Worried About Charity fundraiser in the Last Year:   . Arboriculturist in the Last Year:   Transportation Needs:   . Film/video editor (Medical):   Marland Kitchen Lack of Transportation (Non-Medical):   Physical Activity:   . Days of Exercise per Week:   . Minutes of Exercise per Session:   Stress:   . Feeling of Stress :   Social Connections:   . Frequency of Communication with Friends and Family:   . Frequency of Social Gatherings with Friends and Family:   .  Attends Religious Services:   . Active Member of Clubs or Organizations:   . Attends Archivist Meetings:   Marland Kitchen Marital Status:   Intimate Partner Violence:   . Fear of Current or Ex-Partner:   . Emotionally Abused:   Marland Kitchen Physically Abused:   . Sexually Abused:     ROS Review of Systems  Constitutional: Negative.   HENT: Negative.   Eyes: Negative for visual disturbance.  Respiratory: Negative.   Cardiovascular: Negative.   Gastrointestinal: Negative.   Endocrine: Negative.  Negative for polydipsia and polyuria.  Genitourinary: Negative.   Musculoskeletal: Negative.   Skin: Negative.   Allergic/Immunologic: Negative.   Neurological: Negative.   Hematological: Negative.   Psychiatric/Behavioral: Negative.     Objective:   Today's Vitals: BP 100/63 (BP Location: Left Arm, Patient Position: Sitting, Cuff Size: Normal)   Pulse 79   Temp 98.7 F (37.1 C) (Oral)   Resp 18   Ht _0  (1.727 m)   Wt 145 lb (65.8 kg)   SpO2 97%   BMI  22.05 kg/m   Physical Exam Vitals reviewed.  Constitutional:      Appearance: Normal appearance. She is normal weight.  HENT:     Head: Normocephalic and atraumatic.     Right Ear: External ear normal.     Left Ear: External ear normal.     Nose: Nose normal.     Mouth/Throat:     Mouth: Mucous membranes are dry.     Pharynx: Oropharynx is clear.  Eyes:     Extraocular Movements: Extraocular movements intact.     Conjunctiva/sclera: Conjunctivae normal.     Pupils: Pupils are equal, round, and reactive to light.  Cardiovascular:     Rate and Rhythm: Normal rate and regular rhythm.     Pulses: Normal pulses.     Heart sounds: Normal heart sounds.  Pulmonary:     Effort: Pulmonary effort is normal.     Breath sounds: Normal breath sounds.  Abdominal:     General: Abdomen is flat. Bowel sounds are normal.     Palpations: Abdomen is soft.  Musculoskeletal:        General: Normal range of motion.  Skin:    General: Skin is warm and dry.  Neurological:     General: No focal deficit present.     Mental Status: She is alert and oriented to person, place, and time.  Psychiatric:        Mood and Affect: Mood normal.        Behavior: Behavior normal.        Thought Content: Thought content normal.        Judgment: Judgment normal.     Assessment & Plan:   Problem List Items Addressed This Visit      Other   Pre-diabetes - Primary   Relevant Medications   blood glucose meter kit and supplies   glucose blood (ACCU-CHEK AVIVA PLUS) test strip   Accu-Chek Softclix Lancets lancets   Other Relevant Orders   HgB A1c (Completed)   Glucose (CBG) (Completed)      Outpatient Encounter Medications as of 09/07/2019  Medication Sig  . aspirin 81 MG tablet Take 81 mg by mouth daily.   . calcitRIOL (ROCALTROL) 0.5 MCG capsule Take 3 capsules (1.5 mcg total) by mouth daily.  . cholecalciferol (VITAMIN D3) 25 MCG (1000 UT) tablet Take 1,000 Units by mouth daily.   . diclofenac  sodium (VOLTAREN) 1 % GEL Apply  2 g topically 4 (four) times daily.  Arna Medici 50 MCG tablet Take 1 tablet by mouth twice daily  . fish oil-omega-3 fatty acids 1000 MG capsule Take 2 g by mouth daily.   . meclizine (ANTIVERT) 25 MG tablet Take 1 tablet (25 mg total) by mouth 3 (three) times daily as needed for dizziness.  . Multiple Vitamin (MULTIVITAMIN) tablet Take 1 tablet by mouth daily.  . rosuvastatin (CRESTOR) 20 MG tablet Take 20 mg by mouth daily.  . traMADol (ULTRAM) 50 MG tablet Take 1 tablet by mouth twice daily as needed  . vitamin B-12 (CYANOCOBALAMIN) 1000 MCG tablet Take 1,000 mcg by mouth daily.  . Accu-Chek Softclix Lancets lancets Use as instructed  . blood glucose meter kit and supplies Dispense based on patient and insurance preference. Use up to four times daily as directed. (FOR ICD-10 E10.9, E11.9).  Marland Kitchen glucose blood (ACCU-CHEK AVIVA PLUS) test strip Use as instructed  . [DISCONTINUED] blood glucose meter kit and supplies Dispense based on patient and insurance preference. Use up to four times daily as directed. (FOR ICD-10 E10.9, E11.9).   No facility-administered encounter medications on file as of 09/07/2019.  1. Pre-diabetes Patient's A1c was 5.2, gave patient education on definition of prediabetes.  Patient did not recall an A1c being checked previously, review of medical records did not show a previous one in the last 2 years.  Has had several elevated blood glucose readings on her metabolic panels.  Gave patient education on Mediterranean style diet, encourage patient to check fasting blood glucose, keep log, learn how different foods and alcohol can affect her blood glucose.  Patient is currently taking vitamin D, continue current regimen - HgB A1c - Glucose (CBG) - blood glucose meter kit and supplies; Dispense based on patient and insurance preference. Use up to four times daily as directed. (FOR ICD-10 E10.9, E11.9).  Dispense: 1 each; Refill: 0 - glucose  blood (ACCU-CHEK AVIVA PLUS) test strip; Use as instructed  Dispense: 100 each; Refill: 12 - Accu-Chek Softclix Lancets lancets; Use as instructed  Dispense: 100 each; Refill: 12   I have reviewed the patient's medical history (PMH, PSH, Social History, Family History, Medications, and allergies) , and have been updated if relevant. I spent 30 minutes reviewing chart and face to face time with patient.     Follow-up: Return if symptoms worsen or fail to improve.   Loraine Grip Mayers, PA-C

## 2019-09-08 ENCOUNTER — Telehealth: Payer: Self-pay

## 2019-09-08 NOTE — Telephone Encounter (Signed)
Received fax stating that the glucometer prescribed is not covered by patient's insurance. Please change to a different meter.

## 2019-09-10 MED ORDER — ONETOUCH DELICA LANCETS 33G MISC: 1.0000 | Freq: Three times a day (TID) | 12 refills | Status: DC

## 2019-09-10 MED ORDER — ONETOUCH VERIO VI STRP: ORAL_STRIP | 12 refills | Status: DC

## 2019-09-10 MED ORDER — ONETOUCH VERIO W/DEVICE KIT: 1.0000 | PACK | Freq: Three times a day (TID) | 0 refills | Status: DC

## 2019-09-10 NOTE — Telephone Encounter (Signed)
DM supplies reordered based on patient insurance.

## 2019-09-19 ENCOUNTER — Encounter: Payer: Self-pay | Admitting: Endocrinology

## 2019-09-22 ENCOUNTER — Other Ambulatory Visit: Payer: Self-pay

## 2019-09-26 ENCOUNTER — Other Ambulatory Visit: Payer: Self-pay

## 2019-09-26 ENCOUNTER — Encounter: Payer: Self-pay | Admitting: Endocrinology

## 2019-09-26 ENCOUNTER — Ambulatory Visit (INDEPENDENT_AMBULATORY_CARE_PROVIDER_SITE_OTHER): Payer: 59 | Admitting: Endocrinology

## 2019-09-26 VITALS — BP 120/70 | HR 102 | Ht 68.0 in | Wt 143.8 lb

## 2019-09-26 DIAGNOSIS — E209 Hypoparathyroidism, unspecified: Secondary | ICD-10-CM

## 2019-09-26 DIAGNOSIS — E890 Postprocedural hypothyroidism: Secondary | ICD-10-CM

## 2019-09-26 LAB — PHOSPHORUS: Phosphorus: 4.4 mg/dL (ref 2.3–4.6)

## 2019-09-26 LAB — BASIC METABOLIC PANEL
BUN: 16 mg/dL (ref 6–23)
CO2: 27 mEq/L (ref 19–32)
Calcium: 9 mg/dL (ref 8.4–10.5)
Chloride: 102 mEq/L (ref 96–112)
Creatinine, Ser: 0.89 mg/dL (ref 0.40–1.20)
GFR: 77.11 mL/min (ref >60.00)
Glucose, Bld: 108 mg/dL — ABNORMAL HIGH (ref 70–99)
Potassium: 3.5 mEq/L (ref 3.5–5.1)
Sodium: 137 mEq/L (ref 135–145)

## 2019-09-26 LAB — MAGNESIUM: Magnesium: 1.6 mg/dL (ref 1.5–2.5)

## 2019-09-26 LAB — TSH: TSH: 1.32 u[IU]/mL (ref 0.35–4.50)

## 2019-09-26 LAB — VITAMIN D 25 HYDROXY (VIT D DEFICIENCY, FRACTURES): VITD: 55.57 ng/mL (ref 30.00–100.00)

## 2019-09-26 NOTE — Progress Notes (Signed)
Subjective:    Patient ID: Tricia Woodward, female    DOB: 1955-11-12, 64 y.o.   MRN: [id number]  HPI Pt returns for f/u of postsurgical hypoparathyroidism (she had a thyroidectomy in the 1970's, in [place], for suspicious nodules, which proved on pathol to be benign; since then she has had hypoparathyroidism; in mid-2015, she was found to have low vitamin-D, and was rx'ed ergocalciferol; she also took forteo samples for a few mos in 2015, but had to d/c as it was declined by insurance; she is on chronic rocaltrol; [name] was added in Nov of 2017; [name] was later recalled)  She has slight cramps of the hands, and assoc arthralgias.  She takes vit-D, 2000 units/day, and rocaltrol 3x0.5 mcg/d.  Main symptom is fatigue.   She takes B-12 tabs 1/day Past Medical History:  Diagnosis Date  . Allergy   . Arthritis   . Frequent falls   . Heart murmur   . Hemorrhoids    past hx   . Low blood sugar   . Thyroid disease     Past Surgical History:  Procedure Laterality Date  . ABDOMINAL HYSTERECTOMY     partial  . BREAST CYST EXCISION Bilateral 1983  . BUNIONECTOMY    . DENTAL SURGERY    . THYROID SURGERY Bilateral 1970    Social History   Socioeconomic History  . Marital status: Single    Spouse name: Not on file  . Number of children: Not on file  . Years of education: Not on file  . Highest education level: Not on file  Occupational History  . Not on file  Tobacco Use  . Smoking status: Heavy Tobacco Smoker    Packs/day: 0.50    Types: Cigarettes    Start date: 12/16/1978  . Smokeless tobacco: Current User  . Tobacco comment: e cigs at times  Substance and Sexual Activity  . Alcohol use: Yes    Alcohol/week: 16.0 standard drinks    Types: 6 Standard drinks or equivalent, 4 Cans of beer, 6 Glasses of wine per week    Comment: 1-2 drinks three times a week   . Drug use: No  . Sexual activity: Not Currently  Other Topics Concern  . Not on file  Social History Narrative  . Not  on file   Social Determinants of Health   Financial Resource Strain:   . Difficulty of Paying Living Expenses:   Food Insecurity:   . Worried About Charity fundraiser in the Last Year:   . Arboriculturist in the Last Year:   Transportation Needs:   . Film/video editor (Medical):   Marland Kitchen Lack of Transportation (Non-Medical):   Physical Activity:   . Days of Exercise per Week:   . Minutes of Exercise per Session:   Stress:   . Feeling of Stress :   Social Connections:   . Frequency of Communication with Friends and Family:   . Frequency of Social Gatherings with Friends and Family:   . Attends Religious Services:   . Active Member of Clubs or Organizations:   . Attends Archivist Meetings:   Marland Kitchen Marital Status:   Intimate Partner Violence:   . Fear of Current or Ex-Partner:   . Emotionally Abused:   Marland Kitchen Physically Abused:   . Sexually Abused:     Current Outpatient Medications on File Prior to Visit  Medication Sig Dispense Refill  . aspirin 81 MG tablet Take 81 mg by  mouth daily.     . blood glucose meter kit and supplies Dispense based on patient and insurance preference. Use up to four times daily as directed. (FOR ICD-10 E10.9, E11.9). 1 each 0  . Blood Glucose Monitoring Suppl (ONETOUCH VERIO) w/Device KIT 1 each by Does not apply route in the morning, at noon, and at bedtime. 1 kit 0  . calcitRIOL (ROCALTROL) 0.5 MCG capsule Take 3 capsules (1.5 mcg total) by mouth daily. 90 capsule 11  . cholecalciferol (VITAMIN D3) 25 MCG (1000 UT) tablet Take 1,000 Units by mouth daily.     . diclofenac sodium (VOLTAREN) 1 % GEL Apply 2 g topically 4 (four) times daily. 1 Tube 5  . EUTHYROX 50 MCG tablet Take 1 tablet by mouth twice daily 180 tablet 0  . fish oil-omega-3 fatty acids 1000 MG capsule Take 2 g by mouth daily.     Marland Kitchen glucose blood (ONETOUCH VERIO) test strip Use as instructed 100 each 12  . meclizine (ANTIVERT) 25 MG tablet Take 1 tablet (25 mg total) by mouth 3  (three) times daily as needed for dizziness. 45 tablet 0  . Multiple Vitamin (MULTIVITAMIN) tablet Take 1 tablet by mouth daily.    Glory Rosebush Delica Lancets 68S MISC 1 each by Does not apply route in the morning, at noon, and at bedtime. 100 each 12  . rosuvastatin (CRESTOR) 20 MG tablet Take 20 mg by mouth daily.    . traMADol (ULTRAM) 50 MG tablet Take 1 tablet by mouth twice daily as needed 30 tablet 0  . vitamin B-12 (CYANOCOBALAMIN) 1000 MCG tablet Take 1,000 mcg by mouth daily.     No current facility-administered medications on file prior to visit.    Allergies  Allergen Reactions  . Penicillins     REACTION: Yeast Infections    Family History  Problem Relation Age of Onset  . Hypertension Father   . Hypertension Sister   . Arthritis Other   . Hyperlipidemia Other   . Aneurysm Other   . Colon cancer Neg Hx   . Esophageal cancer Neg Hx   . Rectal cancer Neg Hx   . Stomach cancer Neg Hx   . Breast cancer Neg Hx     BP 120/70 (BP Location: Left Arm, Patient Position: Sitting, Cuff Size: Normal)   Pulse (!) 102   Ht 5' 8"  (1.727 m)   Wt 143 lb 12.8 oz (65.2 kg)   SpO2 97%   BMI 21.86 kg/m    Review of Systems Denies leg cramps and numbness.      Objective:   Physical Exam VITAL SIGNS:  See vs page.  GENERAL: no distress.  GAIT: normal and steady.    Lab Results  Component Value Date   PTH 13 (L) 06/27/2019   PTH Comment 06/27/2019   CALCIUM 9.0 09/26/2019   CAION 1.02 (L) 02/04/2019   PHOS 4.4 09/26/2019   Lab Results  Component Value Date   TSH 1.32 09/26/2019      Assessment & Plan:  Hypoparathyroidism: well-controlled Hypothyroidism: well-replaced Please continue the same medications Please come back for a follow-up appointment in 6 months

## 2019-09-26 NOTE — Patient Instructions (Addendum)
It is expected that "Natpara" will not be available again until next year.   Blood tests are requested for you today.  We'll let you know about the results.  Please come back for a follow-up appointment in 6 months.   

## 2019-10-26 ENCOUNTER — Other Ambulatory Visit: Payer: Self-pay | Admitting: Family Medicine

## 2019-10-26 DIAGNOSIS — E209 Hypoparathyroidism, unspecified: Secondary | ICD-10-CM

## 2019-10-26 DIAGNOSIS — E890 Postprocedural hypothyroidism: Secondary | ICD-10-CM

## 2019-10-26 MED ORDER — LEVOTHYROXINE SODIUM 50 MCG PO TABS: 50.0000 ug | ORAL_TABLET | Freq: Two times a day (BID) | ORAL | 0 refills | Status: DC

## 2019-10-29 ENCOUNTER — Encounter: Payer: Self-pay | Admitting: Family Medicine

## 2019-10-29 ENCOUNTER — Encounter: Payer: Self-pay | Admitting: Endocrinology

## 2019-11-04 ENCOUNTER — Ambulatory Visit (INDEPENDENT_AMBULATORY_CARE_PROVIDER_SITE_OTHER): Payer: 59 | Admitting: Family Medicine

## 2019-11-04 ENCOUNTER — Encounter: Payer: Self-pay | Admitting: Family Medicine

## 2019-11-04 ENCOUNTER — Other Ambulatory Visit: Payer: Self-pay

## 2019-11-04 VITALS — BP 118/62 | HR 95 | Temp 99.6°F | Resp 16 | Ht 68.0 in | Wt 145.8 lb

## 2019-11-04 DIAGNOSIS — M62838 Other muscle spasm: Secondary | ICD-10-CM

## 2019-11-04 DIAGNOSIS — M25522 Pain in left elbow: Secondary | ICD-10-CM | POA: Diagnosis not present

## 2019-11-04 DIAGNOSIS — M79605 Pain in left leg: Secondary | ICD-10-CM

## 2019-11-04 DIAGNOSIS — R768 Other specified abnormal immunological findings in serum: Secondary | ICD-10-CM

## 2019-11-04 MED ORDER — PREDNISONE 10 MG PO TABS: ORAL_TABLET | ORAL | 0 refills | Status: DC

## 2019-11-04 MED ORDER — METHOCARBAMOL 500 MG PO TABS: 500.0000 mg | ORAL_TABLET | Freq: Every evening | ORAL | 0 refills | Status: DC | PRN

## 2019-11-04 NOTE — Patient Instructions (Signed)
We'll notify you of your lab results and determine the next steps START the Prednisone as directed- 3 tabs at the same time x3 days, then 2 tabs at the same time x3 days, and then 1 tab daily.  Take w/ food USE the Methocarbamol as needed before bed for muscle spasm HEAT the neck for spasm relief We'll call you with your orthopedic appt Call with any questions or concerns Have a great weekend!

## 2019-11-04 NOTE — Progress Notes (Signed)
   Subjective:    Patient ID: Tricia Woodward, female    DOB: 22-Mar-1956, 64 y.o.   MRN: [id number]  HPI Hepatitis- pt tried to donate blood and was told she had Hep B and C.  Pt reports no recent sexual activity.  No IVDA.  No tattoos.      L neck pain- pt reports this has been ongoing since fall in August 2020.  Sore 'all the time'.  Some times will ache more than others.  L elbow pain- radiates down into hand.  Started this winter.  Improves w/ ice.  The weakness into L arm is new.    L leg pain- radiates down outside of entire L leg.  This is new within the last month.     Review of Systems For ROS see HPI   This visit occurred during the SARS-CoV-2 public health emergency.  Safety protocols were in place, including screening questions prior to the visit, additional usage of staff PPE, and extensive cleaning of exam room while observing appropriate contact time as indicated for disinfecting solutions.       Objective:   Physical Exam Vitals reviewed.  Constitutional:      General: She is not in acute distress.    Appearance: Normal appearance. She is not ill-appearing or toxic-appearing.  HENT:     Head: Normocephalic and atraumatic.  Eyes:     General: No scleral icterus.    Extraocular Movements: Extraocular movements intact.     Pupils: Pupils are equal, round, and reactive to light.  Cardiovascular:     Pulses: Normal pulses.  Musculoskeletal:        General: No swelling, tenderness or deformity.     Cervical back: Normal range of motion. Tenderness: TTP over L trap spasm.     Comments: No obvious deformity of elbow or L leg  Skin:    General: Skin is warm and dry.  Neurological:     Mental Status: She is alert.     Coordination: Coordination normal.     Deep Tendon Reflexes: Reflexes normal.     Comments: (-) SLR bilaterally Slurring of words, seems mildly altered  Psychiatric:     Comments: Shows little concern for possible Hepatitis B/C- seems detached from  this possibility           Assessment & Plan:  Hepatitis C positive- per recent blood donation.  Will order confirmatory testing and refer for appropriate treatment if in fact +  Hep B Core Antibody positive- per recent blood donation.  Will repeat testing w/ reflex to confirm.  If + will refer for appropriate treatment.  L trap spasm- new.  Pt's sxs are consistent w/ muscle spasm.  Encouraged heat, stretching, and can use Methocarbamol at night if needed for pain.  She has a history of alcohol abuse so cautioned her that these should not be taken together  L elbow pain- radiating into L forearm, hand.  Pain is not new for her but associated weakness is recent.  Start Prednisone taper.  Refer to Ortho for complete evaluation and tx.  Pt expressed understanding and is in agreement w/ plan.   L leg pain- pt is asymptomatic on exam today but her sxs sound consistent w/ sciatica.  Start Pred taper and monitor for improvement.

## 2019-11-05 ENCOUNTER — Encounter: Payer: Self-pay | Admitting: Family Medicine

## 2019-11-05 LAB — HEPATIC FUNCTION PANEL
AG Ratio: 1.2 (calc) (ref 1.0–2.5)
ALT: 73 U/L — ABNORMAL HIGH (ref 6–29)
AST: 68 U/L — ABNORMAL HIGH (ref 10–35)
Albumin: 4.2 g/dL (ref 3.6–5.1)
Alkaline phosphatase (APISO): 74 U/L (ref 37–153)
Bilirubin, Direct: 0.1 mg/dL (ref 0.0–0.2)
Globulin: 3.5 g/dL (calc) (ref 1.9–3.7)
Indirect Bilirubin: 0.4 mg/dL (calc) (ref 0.2–1.2)
Total Bilirubin: 0.5 mg/dL (ref 0.2–1.2)
Total Protein: 7.7 g/dL (ref 6.1–8.1)

## 2019-11-06 LAB — HEPATITIS C RNA QUANTITATIVE
HCV Quantitative Log: 6.28 Log IU/mL — ABNORMAL HIGH
HCV RNA, PCR, QN: 1890000 IU/mL — ABNORMAL HIGH

## 2019-11-07 ENCOUNTER — Encounter: Payer: Self-pay | Admitting: Family Medicine

## 2019-11-07 ENCOUNTER — Other Ambulatory Visit: Payer: Self-pay | Admitting: Family Medicine

## 2019-11-07 DIAGNOSIS — R768 Other specified abnormal immunological findings in serum: Secondary | ICD-10-CM

## 2019-11-08 LAB — HBCIGM: Hep B C IgM: NEGATIVE

## 2019-11-08 LAB — HEPATITIS B CORE AB W/REFLEX: Hep B Core Total Ab: POSITIVE — AB

## 2019-11-23 ENCOUNTER — Ambulatory Visit (INDEPENDENT_AMBULATORY_CARE_PROVIDER_SITE_OTHER): Payer: 59

## 2019-11-23 ENCOUNTER — Encounter: Payer: Self-pay | Admitting: Orthopaedic Surgery

## 2019-11-23 ENCOUNTER — Ambulatory Visit (INDEPENDENT_AMBULATORY_CARE_PROVIDER_SITE_OTHER): Payer: 59 | Admitting: Orthopaedic Surgery

## 2019-11-23 VITALS — Ht 68.5 in | Wt 143.0 lb

## 2019-11-23 DIAGNOSIS — M5412 Radiculopathy, cervical region: Secondary | ICD-10-CM

## 2019-11-23 DIAGNOSIS — M545 Low back pain, unspecified: Secondary | ICD-10-CM | POA: Insufficient documentation

## 2019-11-23 DIAGNOSIS — R2242 Localized swelling, mass and lump, left lower limb: Secondary | ICD-10-CM | POA: Diagnosis not present

## 2019-11-23 DIAGNOSIS — G8929 Other chronic pain: Secondary | ICD-10-CM | POA: Diagnosis not present

## 2019-11-23 MED ORDER — PREDNISONE 10 MG (21) PO TBPK: ORAL_TABLET | ORAL | 0 refills | Status: DC

## 2019-11-23 NOTE — Progress Notes (Signed)
Office Visit Note   Patient: Tricia Woodward           Date of Birth: 08/26/1955           MRN: [id number] Visit Date: 11/23/2019              Requested by: Midge Minium, MD [address] [place],  [place] [address] PCP: Midge Minium, MD   Assessment & Plan: Visit Diagnoses:  1. Chronic left-sided low back pain without sciatica   2. Cervical radiculopathy   3. Knee mass, left     Plan: Impression is cervical and lumbar radiculopathy and left knee mass.  We will need to order an MRI of the left knee to fully evaluate the mass and rule out malignancy.  For the cervical and lumbar radiculopathy given the lack of weakness and focal findings I recommended another prednisone Dosepak and a trial of outpatient PT.  Patient will follow-up after the MRI of the left knee.  Follow-Up Instructions: Return if symptoms worsen or fail to improve.   Orders:  Orders Placed This Encounter  Procedures   XR Knee 1-2 Views Left   Ambulatory referral to Physical Therapy   Meds ordered this encounter  Medications   predniSONE (STERAPRED UNI-PAK 21 TAB) 10 MG (21) TBPK tablet    Sig: Take as directed    Dispense:  21 tablet    Refill:  0      Procedures: No procedures performed   Clinical Data: No additional findings.   Subjective: Chief Complaint  Patient presents with   Neck - Pain   Left Leg - Pain    Tricia Woodward is a 64 year old female who comes in for evaluation of radiculopathy of the cervical spine and lumbar spine down the left arm.  She has occasional numbness and tingling and some occasional weakness in the leg and her arm.  Prednisone has helped with the pain in the left arm and leg.  Denies any significant weakness.  Denies any bowel or bladder dysfunction.  She is also asking to be evaluated for left knee mass.  Denies any constitutional symptoms.  No significant pain or tenderness   Review of Systems  Constitutional: Negative.   HENT: Negative.     Eyes: Negative.   Respiratory: Negative.   Cardiovascular: Negative.   Endocrine: Negative.   Musculoskeletal: Negative.   Neurological: Negative.   Hematological: Negative.   Psychiatric/Behavioral: Negative.   All other systems reviewed and are negative.    Objective: Vital Signs: Ht 5' 8.5" (1.74 m)    Wt 143 lb (64.9 kg)    BMI 21.43 kg/m   Physical Exam Vitals and nursing note reviewed.  Constitutional:      Appearance: She is well-developed.  HENT:     Head: Normocephalic and atraumatic.  Pulmonary:     Effort: Pulmonary effort is normal.  Abdominal:     Palpations: Abdomen is soft.  Musculoskeletal:     Cervical back: Neck supple.  Skin:    General: Skin is warm.     Capillary Refill: Capillary refill takes less than 2 seconds.  Neurological:     Mental Status: She is alert and oriented to person, place, and time.  Psychiatric:        Behavior: Behavior normal.        Thought Content: Thought content normal.        Judgment: Judgment normal.     Ortho Exam Cervical spine exam shows  negative Spurling's.  No focal motor or sensory deficits.  No significant tenderness to palpation.  Normal range of motion. Lumbar spine shows normal range of motion without significant tenderness.  No sciatic tension signs.  No focal motor or sensory deficits. Left knee exam shows a small mass over the left fibular head.  No joint effusion.  No overlying skin changes.  Collaterals and cruciates are stable. Specialty Comments:  No specialty comments available.  Imaging: XR Knee 1-2 Views Left  Result Date: 11/23/2019 No acute or structural abnormalities.    PMFS History: Patient Active Problem List   Diagnosis Date Noted   Chronic left-sided low back pain without sciatica 11/23/2019   Cervical radiculopathy 11/23/2019   Knee mass, left 11/23/2019   Pre-diabetes 06/27/2019   Numbness 07/21/2018   Status post carpal tunnel release 04/17/2016   Physical exam  05/03/2015   OAB (overactive bladder) 11/02/2014   Post-surgical hypothyroidism 10/10/2014   Visual loss 10/10/2014   Hypotension 10/10/2014   Chronic back pain 09/07/2013   Paresthesia of hand, bilateral 09/07/2013   Recurrent falls 01/17/2013   TOBACCO ABUSE 05/15/2010   GOITER 05/14/2010   Hyperlipidemia 05/14/2010   HYPOCALCEMIA 05/14/2010   HYPOPARATHYROIDISM 03/14/2010   Past Medical History:  Diagnosis Date   Allergy    Arthritis    Frequent falls    Heart murmur    Hemorrhoids    past hx    Low blood sugar    Thyroid disease     Family History  Problem Relation Age of Onset   Hypertension Father    Hypertension Sister    Arthritis Other    Hyperlipidemia Other    Aneurysm Other    Colon cancer Neg Hx    Esophageal cancer Neg Hx    Rectal cancer Neg Hx    Stomach cancer Neg Hx    Breast cancer Neg Hx     Past Surgical History:  Procedure Laterality Date   ABDOMINAL HYSTERECTOMY     partial   BREAST CYST EXCISION Bilateral 1983   BUNIONECTOMY     DENTAL SURGERY     THYROID SURGERY Bilateral 1970   Social History   Occupational History   Not on file  Tobacco Use   Smoking status: Heavy Tobacco Smoker    Packs/day: 0.50    Types: Cigarettes    Start date: 12/16/1978   Smokeless tobacco: Current User   Tobacco comment: e cigs at times  Substance and Sexual Activity   Alcohol use: Yes    Alcohol/week: 16.0 standard drinks    Types: 6 Standard drinks or equivalent, 4 Cans of beer, 6 Glasses of wine per week    Comment: 1-2 drinks three times a week    Drug use: No   Sexual activity: Not Currently

## 2019-11-24 ENCOUNTER — Other Ambulatory Visit: Payer: Self-pay

## 2019-11-24 DIAGNOSIS — R2242 Localized swelling, mass and lump, left lower limb: Secondary | ICD-10-CM

## 2019-11-28 ENCOUNTER — Other Ambulatory Visit: Payer: Self-pay

## 2019-11-28 ENCOUNTER — Encounter: Payer: Self-pay | Admitting: Family

## 2019-11-28 ENCOUNTER — Telehealth: Payer: Self-pay | Admitting: Pharmacy Technician

## 2019-11-28 ENCOUNTER — Ambulatory Visit (INDEPENDENT_AMBULATORY_CARE_PROVIDER_SITE_OTHER): Payer: 59 | Admitting: Family

## 2019-11-28 VITALS — BP 115/70 | HR 83 | Temp 98.9°F | Ht 67.5 in | Wt 144.0 lb

## 2019-11-28 DIAGNOSIS — B182 Chronic viral hepatitis C: Secondary | ICD-10-CM | POA: Diagnosis not present

## 2019-11-28 NOTE — Telephone Encounter (Signed)
[facility] Patient Advocate Encounter   I met with the patient and gathered information in case copay assistance is needed.  Will hold until medication is written.  Tricia Woodward. [name] [place] Patient [facility] for Infectious Disease Phone: [phone] Fax:  [phone]

## 2019-11-28 NOTE — Telephone Encounter (Signed)
[facility] Patient Teacher, English as a foreign language completed.    The patient is insured through [facility].  Mavyret is on their formulary and requires a prior authorization.  Harvoni and Raeanne Gathers are not on formulary.  We will continue to follow to see if copay assistance is needed.  Venida Jarvis. [name] [place] Patient [facility] for Infectious Disease Phone: [phone] Fax:  [phone]

## 2019-11-28 NOTE — Patient Instructions (Signed)
Nice to see you.  We will check your blood work today and let you know the results.  We will determine your Hepatitis B status.  Limit acetaminophen (Tylenol) usage to no more than 2 grams (2,000 mg) per day.  Avoid alcohol.  Do not share toothbrushes or razors.  Practice safe sex to protect against transmission as well as sexually transmitted disease.    Hepatitis C Hepatitis C is a viral infection of the liver. It can lead to scarring of the liver (cirrhosis), liver failure, or liver cancer. Hepatitis C may go undetected for months or years because people with the infection may not have symptoms, or they may have only mild symptoms. What are the causes? This condition is caused by the hepatitis C virus (HCV). The virus can spread from person to person (is contagious) through:  Blood.  Childbirth. A woman who has hepatitis C can pass it to her baby during birth.  Bodily fluids, such as breast milk, tears, semen, vaginal fluids, and saliva.  Blood transfusions or organ transplants done in the [place] before 1992.  What increases the risk? The following factors may make you more likely to develop this condition:  Having contact with unclean (contaminated) needles or syringes. This may result from: ? Acupuncture. ? Tattoing. ? Body piercing. ? Injecting drugs.  Having unprotected sex with someone who is infected.  Needing treatment to filter your blood (kidney dialysis).  Having HIV (human immunodeficiency virus) or AIDS (acquired immunodeficiency syndrome).  Working in a job that involves contact with blood or bodily fluids, such as health care.  What are the signs or symptoms? Symptoms of this condition include:  Fatigue.  Loss of appetite.  Nausea.  Vomiting.  Abdominal pain.  Dark yellow urine.  Yellowish skin and eyes (jaundice).  Itchy skin.  Clay-colored bowel movements.  Joint pain.  Bleeding and bruising easily.  Fluid building up in  your stomach (ascites).  In some cases, you may not have any symptoms. How is this diagnosed? This condition is diagnosed with:  Blood tests.  Other tests to check how well your liver is functioning. They may include: ? Magnetic resonance elastography (MRE). This imaging test uses MRIs and sound waves to measure liver stiffness. ? Transient elastography. This imaging test uses ultrasounds to measure liver stiffness. ? Liver biopsy. This test requires taking a small tissue sample from your liver to examine it under a microscope.  How is this treated? Your health care provider may perform noninvasive tests or a liver biopsy to help decide the best course of treatment. Treatment may include:  Antiviral medicines and other medicines.  Follow-up treatments every 6-12 months for infections or other liver conditions.  Receiving a donated liver (liver transplant).  Follow these instructions at home: Medicines  Take over-the-counter and prescription medicines only as told by your health care provider.  Take your antiviral medicine as told by your health care provider. Do not stop taking the antiviral even if you start to feel better.  Do not take any medicines unless approved by your health care provider, including over-the-counter medicines and birth control pills. Activity  Rest as needed.  Do not have sex unless approved by your health care provider.  Ask your health care provider when you may return to school or work. Eating and drinking  Eat a balanced diet with plenty of fruits and vegetables, whole grains, and lowfat (lean) meats or non-meat proteins (such as beans or tofu).  Drink enough fluids to  keep your urine clear or pale yellow.  Do not drink alcohol. General instructions  Do not share toothbrushes, nail clippers, or razors.  Wash your hands frequently with soap and water. If soap and water are not available, use hand sanitizer.  Cover any cuts or open sores on  your skin to prevent spreading the virus.  Keep all follow-up visits as told by your health care provider. This is important. You may need follow-up visits every 6-12 months. How is this prevented? There is no vaccine for hepatitis C. The only way to prevent the disease is to reduce the risk of exposure to the virus. Make sure you:  Wash your hands frequently with soap and water. If soap and water are not available, use hand sanitizer.  Do not share needles or syringes.  Practice safe sex and use condoms.  Avoid handling blood or bodily fluids without gloves or other protection.  Avoid getting tattoos or piercings in shops or other locations that are not clean.  Contact a health care provider if:  You have a fever.  You develop abdominal pain.  You pass dark urine.  You pass clay-colored stools.  You develop joint pain. Get help right away if:  You have increasing fatigue or weakness.  You lose your appetite.  You cannot eat or drink without vomiting.  You develop jaundice or your jaundice gets worse.  You bruise or bleed easily. Summary  Hepatitis C is a viral infection of the liver. It can lead to scarring of the liver (cirrhosis), liver failure, or liver cancer.  The hepatitis C virus (HCV) causes this condition. The virus can pass from person to person (is contagious).  You should not take any medicines unless approved by your health care provider. This includes over-the-counter medicines and birth control pills. This information is not intended to replace advice given to you by your health care provider. Make sure you discuss any questions you have with your health care provider. Document Released: 05/30/2000 Document Revised: 07/08/2016 Document Reviewed: 07/08/2016 Elsevier Interactive Patient Education  [employer].

## 2019-11-28 NOTE — Progress Notes (Signed)
Subjective:    Patient ID: Tricia Woodward, female    DOB: 04/15/1956, 64 y.o.   MRN: [id number]  Chief Complaint  Patient presents with  . New Patient (Initial Visit)    Hep C    HPI:  Tricia Woodward is a 64 y.o. female with previous medical history of goiter, hypothyroidism, prediabetes, hypertension, and overactive bladder presenting today for initial treatment and evaluation of chronic hepatitis C.  Tricia Woodward was initially diagnosed with hepatitis C about 3 weeks ago through primary care blood work.  She does vaguely recall having been denied the ability to donate blood about 15 to 20 years ago.  No previous history of IV drug use, blood transfusions prior to 1992, sharing of toothbrushes/razors, tattoos from homemade source, or sexual contact with known positive partner.  She has concerned she may have acquired hepatitis C from previous injections for hyperthyroidism.  Mother died at the age of 52 with cirrhosis/"hardening of the liver".  No personal history of liver disease with elevated LFTs with most recent blood work.  Currently asymptomatic and denies nausea, vomiting, right abdominal pain, scleral icterus, or jaundice.  Currently drinks about 4-5 alcoholic drinks per week on average.  No recreational or illicit drug use and smokes approximately 1/2 pack of cigarettes per day on average.  Previous blood work reviewed with hepatitis C viral load of 1.89 million.   Allergies  Allergen Reactions  . Penicillins     REACTION: Yeast Infections      Outpatient Medications Prior to Visit  Medication Sig Dispense Refill  . aspirin 81 MG tablet Take 81 mg by mouth daily.     . calcitRIOL (ROCALTROL) 0.5 MCG capsule Take 3 capsules (1.5 mcg total) by mouth daily. 90 capsule 11  . cholecalciferol (VITAMIN D3) 25 MCG (1000 UT) tablet Take 1,000 Units by mouth daily.     . diclofenac sodium (VOLTAREN) 1 % GEL Apply 2 g topically 4 (four) times daily. 1 Tube 5  . fish oil-omega-3  fatty acids 1000 MG capsule Take 2 g by mouth daily.     Marland Kitchen levothyroxine (EUTHYROX) 50 MCG tablet Take 1 tablet (50 mcg total) by mouth 2 (two) times daily. 180 tablet 0  . Multiple Vitamin (MULTIVITAMIN) tablet Take 1 tablet by mouth daily.    . predniSONE (DELTASONE) 10 MG tablet 3 tabs x3 days and then 2 tabs x3 days and then 1 tab x3 days.  Take w/ food. 18 tablet 0  . predniSONE (STERAPRED UNI-PAK 21 TAB) 10 MG (21) TBPK tablet Take as directed 21 tablet 0  . rosuvastatin (CRESTOR) 20 MG tablet Take 20 mg by mouth daily.    . traMADol (ULTRAM) 50 MG tablet Take 1 tablet by mouth twice daily as needed 30 tablet 0  . vitamin B-12 (CYANOCOBALAMIN) 1000 MCG tablet Take 1,000 mcg by mouth daily.    . methocarbamol (ROBAXIN) 500 MG tablet Take 1 tablet (500 mg total) by mouth at bedtime as needed for muscle spasms. (Patient not taking: Reported on 11/28/2019) 20 tablet 0  . EUTHYROX 50 MCG tablet Take 1 tablet by mouth twice daily (Patient not taking: Reported on 11/28/2019) 180 tablet 0   No facility-administered medications prior to visit.     Past Medical History:  Diagnosis Date  . Allergy   . Arthritis   . Frequent falls   . Heart murmur   . Hemorrhoids    past hx   . Low blood sugar   .  Thyroid disease       Past Surgical History:  Procedure Laterality Date  . ABDOMINAL HYSTERECTOMY     partial  . BREAST CYST EXCISION Bilateral 1983  . BUNIONECTOMY    . DENTAL SURGERY    . THYROID SURGERY Bilateral 1970      Family History  Problem Relation Age of Onset  . Hypertension Father   . Hypertension Sister   . Cirrhosis Mother   . Arthritis Other   . Hyperlipidemia Other   . Aneurysm Other   . Colon cancer Neg Hx   . Esophageal cancer Neg Hx   . Rectal cancer Neg Hx   . Stomach cancer Neg Hx   . Breast cancer Neg Hx       Social History   Socioeconomic History  . Marital status: Single    Spouse name: Not on file  . Number of children: Not on file  . Years  of education: Not on file  . Highest education level: Not on file  Occupational History  . Not on file  Tobacco Use  . Smoking status: Heavy Tobacco Smoker    Packs/day: 0.50    Types: Cigarettes    Start date: 12/16/1978  . Smokeless tobacco: Never Used  . Tobacco comment: e cigs at times  Vaping Use  . Vaping Use: Former  Substance and Sexual Activity  . Alcohol use: Yes    Alcohol/week: 5.0 standard drinks    Types: 2 Glasses of wine, 3 Cans of beer per week    Comment: 4-5  drinks per times a week   . Drug use: No  . Sexual activity: Not Currently  Other Topics Concern  . Not on file  Social History Narrative  . Not on file   Social Determinants of Health   Financial Resource Strain:   . Difficulty of Paying Living Expenses:   Food Insecurity:   . Worried About Charity fundraiser in the Last Year:   . Arboriculturist in the Last Year:   Transportation Needs:   . Film/video editor (Medical):   Marland Kitchen Lack of Transportation (Non-Medical):   Physical Activity:   . Days of Exercise per Week:   . Minutes of Exercise per Session:   Stress:   . Feeling of Stress :   Social Connections:   . Frequency of Communication with Friends and Family:   . Frequency of Social Gatherings with Friends and Family:   . Attends Religious Services:   . Active Member of Clubs or Organizations:   . Attends Archivist Meetings:   Marland Kitchen Marital Status:   Intimate Partner Violence:   . Fear of Current or Ex-Partner:   . Emotionally Abused:   Marland Kitchen Physically Abused:   . Sexually Abused:     Review of Systems  Constitutional: Negative for chills, fatigue, fever and unexpected weight change.  Respiratory: Negative for cough, chest tightness, shortness of breath and wheezing.   Cardiovascular: Negative for chest pain and leg swelling.  Gastrointestinal: Negative for abdominal distention, constipation, diarrhea, nausea and vomiting.  Neurological: Negative for dizziness, weakness,  light-headedness and headaches.  Hematological: Does not bruise/bleed easily.       Objective:    BP 115/70   Pulse 83   Temp 98.9 F (37.2 C)   Ht 5' 7.5" (1.715 m)   Wt 144 lb (65.3 kg)   SpO2 98%   BMI 22.22 kg/m  Nursing note and vital signs reviewed.  Physical Exam Constitutional:      General: She is not in acute distress.    Appearance: She is well-developed.  Cardiovascular:     Rate and Rhythm: Normal rate and regular rhythm.     Heart sounds: Normal heart sounds. No murmur heard.  No friction rub. No gallop.   Pulmonary:     Effort: Pulmonary effort is normal. No respiratory distress.     Breath sounds: Normal breath sounds. No wheezing or rales.  Chest:     Chest wall: No tenderness.  Abdominal:     General: Bowel sounds are normal. There is no distension.     Palpations: Abdomen is soft. There is no mass.     Tenderness: There is no abdominal tenderness. There is no guarding or rebound.  Skin:    General: Skin is warm and dry.  Neurological:     Mental Status: She is alert and oriented to person, place, and time.  Psychiatric:        Behavior: Behavior normal.        Thought Content: Thought content normal.        Judgment: Judgment normal.         Assessment & Plan:   Patient Active Problem List   Diagnosis Date Noted  . Chronic hepatitis C without hepatic coma ([place]) 11/29/2019  . Chronic left-sided low back pain without sciatica 11/23/2019  . Cervical radiculopathy 11/23/2019  . Knee mass, left 11/23/2019  . Pre-diabetes 06/27/2019  . Numbness 07/21/2018  . Status post carpal tunnel release 04/17/2016  . Physical exam 05/03/2015  . OAB (overactive bladder) 11/02/2014  . Post-surgical hypothyroidism 10/10/2014  . Visual loss 10/10/2014  . Hypotension 10/10/2014  . Chronic back pain 09/07/2013  . Paresthesia of hand, bilateral 09/07/2013  . Recurrent falls 01/17/2013  . TOBACCO ABUSE 05/15/2010  . GOITER 05/14/2010  . Hyperlipidemia  05/14/2010  . HYPOCALCEMIA 05/14/2010  . HYPOPARATHYROIDISM 03/14/2010     Problem List Items Addressed This Visit      Digestive   Chronic hepatitis C without hepatic coma ([place]) - Primary    Tricia Woodward is a 65 year old female with chronic hepatitis C with initial viral load of 1.89 million.  Source of infection unclear however age is a risk factor she was born between [birth year] and [birth year].  She is treatment nave and currently asymptomatic.  Discussed the pathogenesis, risks if left untreated, prevention, and treatment options for hepatitis C.  We will check blood work today including hepatitis B status, hepatitis C genotype, liver function testing, liver fibrosis, and HIV status.  She met with pharmacy to complete financial paperwork for medication assistance.  Treatment recommendation pending blood work results.      Relevant Orders   Hepatitis B surface antibody,qualitative (Completed)   Hepatitis B surface antigen (Completed)   HIV Antibody (routine testing w rflx) (Completed)   Hepatitis C genotype   Liver Fibrosis, FibroTest-ActiTest   Protime-INR (Completed)   Hepatic function panel (Completed)   CBC (Completed)   Hepatitis B e antibody   Hepatitis B e antigen   Hepatitis B DNA, ultraquantitative, PCR       I have discontinued Tricia Woodward's Euthyrox. I am also having her maintain her aspirin, fish oil-omega-3 fatty acids, multivitamin, vitamin B-12, diclofenac sodium, calcitRIOL, cholecalciferol, rosuvastatin, traMADol, levothyroxine, predniSONE, methocarbamol, and predniSONE.   Follow-up: Return Pending blood work results and following initiation of treatment.    Terri Piedra, MSN, FNP-C Nurse Practitioner [facility]  [place] number: [phone]

## 2019-11-29 ENCOUNTER — Encounter: Payer: Self-pay | Admitting: Family

## 2019-11-29 DIAGNOSIS — B182 Chronic viral hepatitis C: Secondary | ICD-10-CM | POA: Insufficient documentation

## 2019-11-29 NOTE — Assessment & Plan Note (Signed)
Tricia Woodward is a 64 year old female with chronic hepatitis C with initial viral load of 1.89 million.  Source of infection unclear however age is a risk factor she was born between [birth year] and [birth year].  She is treatment nave and currently asymptomatic.  Discussed the pathogenesis, risks if left untreated, prevention, and treatment options for hepatitis C.  We will check blood work today including hepatitis B status, hepatitis C genotype, liver function testing, liver fibrosis, and HIV status.  She met with pharmacy to complete financial paperwork for medication assistance.  Treatment recommendation pending blood work results.

## 2019-12-02 ENCOUNTER — Other Ambulatory Visit: Payer: Self-pay

## 2019-12-02 ENCOUNTER — Ambulatory Visit: Payer: 59 | Admitting: Physical Therapy

## 2019-12-02 ENCOUNTER — Encounter: Payer: Self-pay | Admitting: Physical Therapy

## 2019-12-02 DIAGNOSIS — R296 Repeated falls: Secondary | ICD-10-CM

## 2019-12-02 DIAGNOSIS — M542 Cervicalgia: Secondary | ICD-10-CM | POA: Diagnosis not present

## 2019-12-02 DIAGNOSIS — M6281 Muscle weakness (generalized): Secondary | ICD-10-CM

## 2019-12-02 DIAGNOSIS — G8929 Other chronic pain: Secondary | ICD-10-CM

## 2019-12-02 DIAGNOSIS — M5442 Lumbago with sciatica, left side: Secondary | ICD-10-CM

## 2019-12-02 DIAGNOSIS — M5441 Lumbago with sciatica, right side: Secondary | ICD-10-CM

## 2019-12-02 NOTE — Therapy (Signed)
[place], [place], [address] Phone: [phone]   Fax:  [phone]  Physical Therapy Evaluation  Patient Details  Name: Tricia Woodward MRN: [id number] Date of Birth: Mar 09, 1956 Referring Provider (PT): Leandrew Koyanagi, MD    Encounter Date: 12/02/2019   PT End of Session - 12/02/19 1429    Visit Number 1    Number of Visits 13    Date for PT Re-Evaluation 01/13/20    PT Start Time 1430    PT Stop Time 4166    PT Time Calculation (min) 46 min    Activity Tolerance Patient tolerated treatment well    Behavior During Therapy [facility] for tasks assessed/performed           Past Medical History:  Diagnosis Date   Allergy    Arthritis    Frequent falls    Heart murmur    Hemorrhoids    past hx    Low blood sugar    Thyroid disease     Past Surgical History:  Procedure Laterality Date   ABDOMINAL HYSTERECTOMY     partial   BREAST CYST EXCISION Bilateral 1983   BUNIONECTOMY     CARPAL TUNNEL RELEASE Left 2017   DENTAL SURGERY     THYROID SURGERY Bilateral 1970    There were no vitals filed for this visit.    Subjective Assessment - 12/02/19 1436    Subjective pt is a 64 y.o F with CC of neck pain that started after falling down 13 stairs in August 2020. she reports the pain started in the neck and radiated down the L arm into her hand and fingers, she noted N/T down the LUE with occur mostly at night. she notes improvement since she has been on prednisone with no pain in the neck or referred symptoms down the LUE. She reports back pain since she was in her mid 20's that has fluctuated in severity. She reports the pain starts in the back and initally referred N/t and burning, the LLE but had progress to bil legs. she reports hx or LE giving way and feeling weak. She reports the prednisone didn't stop the back and leg pain.    How long can you sit comfortably? unlimited    How long can you stand  comfortably? unlimited    How long can you walk comfortably? unlimited    Diagnostic tests nothing for the neck and back    Currently in Pain? Yes    Pain Score 0-No pain    Pain Location Neck    Pain Orientation Left;Right    Aggravating Factors  N/A    Pain Relieving Factors prednisone    Effect of Pain on Daily Activities to decrease pain,    Multiple Pain Sites Yes    Pain Score 0   6/10 pain earlier and took tramadol this morning   Pain Location Back    Pain Orientation Right;Left    Pain Descriptors / Indicators Aching;Burning;Sore    Pain Type Chronic pain    Pain Radiating Towards burning down bil LE in to both feet.    Pain Onset More than a month ago    Pain Frequency Intermittent    Aggravating Factors  sitting in certiain position,    Pain Relieving Factors medication, exercise    Effect of Pain on Daily Activities limited sitting tolerance              [facility] PT Assessment - 12/02/19  1428      Assessment   Medical Diagnosis Chronic left-sided low back pain without sciatica M54.5, G89.29, Cervical radiculopathy M54.12, Knee mass, left R22.42    Referring Provider (PT) Leandrew Koyanagi, MD     Onset Date/Surgical Date --   neck in August 2020, back over 40 years ago   Hand Dominance Right    Next MD Visit 01/04/2020    Prior Therapy yes      Precautions   Precautions None      Restrictions   Weight Bearing Restrictions No      Balance Screen   Has the patient fallen in the past 6 months Yes    How many times? 3    Has the patient had a decrease in activity level because of a fear of falling?  No    Is the patient reluctant to leave their home because of a fear of falling?  No      Home Environment   Living Environment Private residence    Living Arrangements Alone    Type of [place] Access Stairs to enter    Entrance Stairs-Number of Steps 14    Entrance Stairs-Rails Can reach both    [place] Two level    Alternate Level Stairs-Number of  Steps 12    Alternate Level Stairs-Rails Right   ascending     Prior Function   Level of Independence Independent    Vocation Retired      Associate Professor   Overall Cognitive Status Within Functional Limits for tasks assessed      Sensation   Light Touch Impaired by gross assessment    Additional Comments decreaesd light touch along the R L4/5 dermatome      Posture/Postural Control   Posture/Postural Control Postural limitations    Postural Limitations Forward head      ROM / Strength   AROM / PROM / Strength AROM;Strength      AROM   Overall AROM Comments RFIS produced RLE concordant symptoms, REIS she noted decreased tightness / numbness    AROM Assessment Site Cervical;Lumbar    Cervical Flexion 52    Cervical Extension 52    Cervical - Right Side Bend 30    Cervical - Left Side Bend 30    Cervical - Right Rotation 71    Cervical - Left Rotation 67    Lumbar Flexion 110    Lumbar Extension 23    Lumbar - Right Side Bend 25    Lumbar - Left Side Bend 25    Lumbar - Right Rotation 75%    Lumbar - Left Rotation 75%      Strength   Strength Assessment Site Hip;Knee;Shoulder    Right/Left Shoulder Right;Left    Right Shoulder Flexion 4/5    Right Shoulder Extension 4+/5    Right Shoulder ABduction 4-/5    Right Shoulder Internal Rotation 4+/5    Right Shoulder External Rotation 4/5    Left Shoulder Flexion 4/5    Left Shoulder Extension 4+/5    Left Shoulder ABduction 4/5    Left Shoulder Internal Rotation 4+/5    Left Shoulder External Rotation 4/5    Right/Left Hip Right;Left    Right Hip Flexion 4-/5    Right Hip Extension 4/5    Right Hip ABduction 4/5    Left Hip Flexion 4-/5    Left Hip Extension 4/5    Left Hip ABduction 4/5  Right/Left Knee Right;Left    Right Knee Flexion 5/5    Right Knee Extension 5/5    Left Knee Flexion 5/5    Left Knee Extension 5/5      Palpation   Palpation comment multiple trigger points noted in bil upper trap/ levator  scapuale with L>R,        Special Tests    Special Tests Cervical;Lumbar    Cervical Tests Spurling's;Dictraction;other    Lumbar Tests Slump Test      Spurling's   Findings Negative      Distraction Test   Findngs Negative      other    Findings Negative    Comment ULLT      Slump test   Findings Negative                      Objective measurements completed on examination: See above findings.       [place] Adult PT Treatment/Exercise - 12/02/19 1428      Exercises   Exercises Lumbar;Neck      Neck Exercises: Seated   Neck Retraction 10 reps;5 secs   verbal cues/ demonstation for form     Lumbar Exercises: Stretches   Hip Flexor Stretch Left;Right;1 rep;30 seconds    Press Ups 15 reps   x 2 sets   Press Ups Limitations reported reduced LLE referred tightness/ burning      Neck Exercises: Stretches   Upper Trapezius Stretch Left;1 rep;30 seconds                  PT Education - 12/02/19 1429    Education Details evaluation findings, POC, goals, HEP with proper form/ rationale    Person(s) Educated Patient    Methods Explanation;Verbal cues;Handout    Comprehension Verbalized understanding;Verbal cues required            PT Short Term Goals - 12/02/19 1528      PT SHORT TERM GOAL #1   Title pt to be I with inital HEP    Time 3    Period Weeks    Status New    Target Date 12/23/19             PT Long Term Goals - 12/02/19 1528      PT LONG TERM GOAL #1   Title pt to decrease L upper trap and surrounding musculature to promote cervical L rotation to >/= 5 degrees    Time 6    Period Weeks    Status New    Target Date 01/13/20      PT LONG TERM GOAL #2   Title pt to report decreased LE referral to </= 1 x a week for improvement of condition    Time 6    Period Weeks    Status New    Target Date 01/13/20      PT LONG TERM GOAL #3   Title increase bil gross shoulder strength to >/= 4+/5 for functional strength and  lifting mechanics    Time 6    Period Weeks    Status New    Target Date 01/13/20      PT LONG TERM GOAL #4   Title increase gross hip strength to >/= 4+/5 to promote hip and trunk stability    Time 6    Period Weeks    Status New    Target Date 01/13/20      PT LONG TERM GOAL #5  Title pt to be I with all HEP given to maintain and progress current level of function    Time 6    Period Weeks    Status New    Target Date 01/13/20                  Plan - 12/02/19 1430    Clinical Impression Statement pt presents to OPPT with CC of neck pain starting after falling down stairs in August of 2020, and lowback pain starting over 40 years ago. She has functional cervical and trunk ROM with  stiffness noted end range cervical sidebending bil with palpable trigger points in L upper trap/ levator scapuale. She does exhibit gross shoulder and hip weakness She demonstrates extension bias noting centralization of concordant symptoms with repeated extension. all special testing was negative for neck and back. pt would benefit from physical therapy to decrease upper trap stiffness, reduce LE referred symptoms, increase strength and maximize function by addressing the deficits listed.    Personal Factors and Comorbidities Comorbidity 1    Comorbidities hx of frequent falls    Stability/Clinical Decision Making Evolving/Moderate complexity    Clinical Decision Making Moderate    Rehab Potential Good    PT Frequency 1x / week    PT Duration 6 weeks    PT Treatment/Interventions ADLs/Self Care Home Management;Cryotherapy;Electrical Stimulation;Iontophoresis 4mg /ml Dexamethasone;Moist Heat;Ultrasound;Traction;Therapeutic activities;Therapeutic exercise;Stair training;Gait training;Neuromuscular re-education;Patient/family education;Manual techniques;Passive range of motion;Dry needling;Taping    PT Next Visit Plan review/ update HEP PRN, assess berg balance due to frequent falls, extension bias  progress if tolerated well, gross shoulder/ hip strength,    PT Home Exercise Plan [id number] - upper trap stretch, (seated) chin tuck, Rows, prone press up, sidelying hip abduction, (standing) hip flexor stretch    Consulted and Agree with Plan of Care Patient           Patient will benefit from skilled therapeutic intervention in order to improve the following deficits and impairments:  Improper body mechanics, Decreased strength, Pain, Decreased activity tolerance, Decreased endurance, Postural dysfunction  Visit Diagnosis: Cervicalgia  Chronic bilateral low back pain with bilateral sciatica  Muscle weakness (generalized)  Repeated falls     Problem List Patient Active Problem List   Diagnosis Date Noted   Chronic hepatitis C without hepatic coma ([place]) 11/29/2019   Chronic left-sided low back pain without sciatica 11/23/2019   Cervical radiculopathy 11/23/2019   Knee mass, left 11/23/2019   Pre-diabetes 06/27/2019   Numbness 07/21/2018   Status post carpal tunnel release 04/17/2016   Physical exam 05/03/2015   OAB (overactive bladder) 11/02/2014   Post-surgical hypothyroidism 10/10/2014   Visual loss 10/10/2014   Hypotension 10/10/2014   Chronic back pain 09/07/2013   Paresthesia of hand, bilateral 09/07/2013   Recurrent falls 01/17/2013   TOBACCO ABUSE 05/15/2010   GOITER 05/14/2010   Hyperlipidemia 05/14/2010   HYPOCALCEMIA 05/14/2010   HYPOPARATHYROIDISM 03/14/2010   Starr Lake PT, DPT, LAT, ATC  12/02/19  3:36 PM      [facility] [address] [place], [place], [address] Phone: [phone]   Fax:  [phone]  Name: ANELIESE BEAUDRY MRN: [id number] Date of Birth: 04/06/1956

## 2019-12-10 LAB — HEPATITIS B E ANTIGEN: Hep B E Ag: NONREACTIVE

## 2019-12-10 LAB — LIVER FIBROSIS, FIBROTEST-ACTITEST
ALT: 49 U/L — ABNORMAL HIGH (ref 6–29)
Alpha-2-Macroglobulin: 292 mg/dL — ABNORMAL HIGH (ref 106–279)
Apolipoprotein A1: 255 mg/dL — ABNORMAL HIGH (ref 101–198)
Bilirubin: 0.3 mg/dL (ref 0.2–1.2)
GGT: 130 U/L — ABNORMAL HIGH (ref 3–65)
Haptoglobin: 48 mg/dL (ref 43–212)

## 2019-12-10 LAB — CBC
HCT: 35.9 % (ref 35.0–45.0)
Hemoglobin: 12 g/dL (ref 11.7–15.5)
MCH: 29.6 pg (ref 27.0–33.0)
MCHC: 33.4 g/dL (ref 32.0–36.0)
MCV: 88.6 fL (ref 80.0–100.0)
MPV: 9.8 fL (ref 7.5–12.5)
Platelets: 280 10*3/uL (ref 140–400)
RBC: 4.05 10*6/uL (ref 3.80–5.10)
RDW: 13.1 % (ref 11.0–15.0)
WBC: 8.9 10*3/uL (ref 3.8–10.8)

## 2019-12-10 LAB — HEPATIC FUNCTION PANEL
AG Ratio: 1.2 (calc) (ref 1.0–2.5)
ALT: 52 U/L — ABNORMAL HIGH (ref 6–29)
AST: 43 U/L — ABNORMAL HIGH (ref 10–35)
Albumin: 3.9 g/dL (ref 3.6–5.1)
Alkaline phosphatase (APISO): 54 U/L (ref 37–153)
Bilirubin, Direct: 0.1 mg/dL (ref 0.0–0.2)
Globulin: 3.2 g/dL (calc) (ref 1.9–3.7)
Indirect Bilirubin: 0.3 mg/dL (calc) (ref 0.2–1.2)
Total Bilirubin: 0.4 mg/dL (ref 0.2–1.2)
Total Protein: 7.1 g/dL (ref 6.1–8.1)

## 2019-12-10 LAB — HEPATITIS B SURFACE ANTIBODY,QUALITATIVE: Hep B S Ab: REACTIVE — AB

## 2019-12-10 LAB — HEPATITIS B E ANTIBODY: Hep B E Ab: REACTIVE — AB

## 2019-12-10 LAB — PROTIME-INR
INR: 1
Prothrombin Time: 10.3 s (ref 9.0–11.5)

## 2019-12-10 LAB — HIV ANTIBODY (ROUTINE TESTING W REFLEX): HIV 1&2 Ab, 4th Generation: NONREACTIVE

## 2019-12-10 LAB — HEPATITIS C GENOTYPE

## 2019-12-10 LAB — HEPATITIS B DNA, ULTRAQUANTITATIVE, PCR
Hepatitis B DNA (Calc): <1 Log IU/mL
Hepatitis B DNA: <10 IU/mL

## 2019-12-10 LAB — HEPATITIS B SURFACE ANTIGEN: Hepatitis B Surface Ag: NONREACTIVE

## 2019-12-14 ENCOUNTER — Other Ambulatory Visit: Payer: Self-pay | Admitting: Family

## 2019-12-14 DIAGNOSIS — B182 Chronic viral hepatitis C: Secondary | ICD-10-CM

## 2019-12-14 MED ORDER — MAVYRET 100-40 MG PO TABS: 3.0000 | ORAL_TABLET | Freq: Every day | ORAL | 1 refills | Status: DC

## 2019-12-15 ENCOUNTER — Telehealth: Payer: Self-pay

## 2019-12-15 NOTE — Telephone Encounter (Signed)
Patient returning call from provider. Requested call back. Per patient, please call cell phone [phone]) as she is out of town.  Tricia Insalaco Lorita Officer, RN

## 2019-12-15 NOTE — Telephone Encounter (Signed)
Spoke with Tricia Woodward regarding her results. She will be out of town until July 9th.

## 2019-12-21 ENCOUNTER — Other Ambulatory Visit: Payer: Self-pay | Admitting: Pharmacist

## 2019-12-21 ENCOUNTER — Encounter: Payer: 59 | Admitting: Physical Therapy

## 2019-12-21 ENCOUNTER — Telehealth: Payer: Self-pay | Admitting: Pharmacist

## 2019-12-21 ENCOUNTER — Telehealth: Payer: Self-pay | Admitting: Pharmacy Technician

## 2019-12-21 ENCOUNTER — Encounter: Payer: Self-pay | Admitting: Pharmacy Technician

## 2019-12-21 DIAGNOSIS — B182 Chronic viral hepatitis C: Secondary | ICD-10-CM

## 2019-12-21 MED ORDER — MAVYRET 100-40 MG PO TABS: 3.0000 | ORAL_TABLET | Freq: Every day | ORAL | 1 refills | Status: DC

## 2019-12-21 NOTE — Telephone Encounter (Signed)
Patient is approved to receive Mavyret x 8 weeks for chronic Hepatitis C infection. Counseled patient to take all three tablets of Mavyret daily with food.  Counseled patient the need to take all three tablets together and to not separate them out during the day. Encouraged patient not to miss any doses and explained how their chance of cure could go down with each dose missed. Counseled patient on what to do if dose is missed - if it is closer to the missed dose take immediately; if closer to next dose then skip dose and take the next dose at the usual time.   Counseled patient on common side effects such as headache, fatigue, and nausea and that these normally decrease with time. I reviewed patient medications and found no drug interactions. Discussed with patient that there are several drug interactions with Mavyret and instructed patient to call the clinic if she wishes to start a new medication during course of therapy. Also advised patient to call if she experiences any side effects. Patient will follow-up with me in the pharmacy clinic 4 weeks after starting treatment.

## 2019-12-21 NOTE — Telephone Encounter (Addendum)
[facility] Patient Advocate Encounter   Received notification from [facility] that prior authorization for [place] is required.   PA submitted on 12/21/2019 Key [id number] Status is APPROVED  Prescription must be filled through [facility].  Copay card in case of a copay...      [facility] will continue to follow.   Venida Jarvis. [name] [place] Patient [facility] for Infect[facility] Phone: [phone] Fax:  [phone]

## 2019-12-21 NOTE — Progress Notes (Signed)
Patient's insurance requires Mavyret to be filled at [facility]. Resending Rx now. Inez Catalina will call patient and coordinate.

## 2019-12-23 ENCOUNTER — Encounter: Payer: Self-pay | Admitting: Physical Therapy

## 2019-12-23 ENCOUNTER — Ambulatory Visit (INDEPENDENT_AMBULATORY_CARE_PROVIDER_SITE_OTHER): Payer: 59 | Admitting: Physical Therapy

## 2019-12-23 ENCOUNTER — Other Ambulatory Visit: Payer: Self-pay

## 2019-12-23 DIAGNOSIS — R296 Repeated falls: Secondary | ICD-10-CM | POA: Diagnosis not present

## 2019-12-23 DIAGNOSIS — M5442 Lumbago with sciatica, left side: Secondary | ICD-10-CM

## 2019-12-23 DIAGNOSIS — M6281 Muscle weakness (generalized): Secondary | ICD-10-CM

## 2019-12-23 DIAGNOSIS — M542 Cervicalgia: Secondary | ICD-10-CM

## 2019-12-23 DIAGNOSIS — G8929 Other chronic pain: Secondary | ICD-10-CM

## 2019-12-23 DIAGNOSIS — M5441 Lumbago with sciatica, right side: Secondary | ICD-10-CM

## 2019-12-23 NOTE — Therapy (Signed)
[place], [place], [address] Phone: [phone]   Fax:  [phone]  Physical Therapy Treatment  Patient Details  Name: Tricia Woodward MRN: [id number] Date of Birth: 10/13/1955 Referring Provider (PT): Leandrew Koyanagi, MD    Encounter Date: 12/23/2019   PT End of Session - 12/23/19 1600    Visit Number 2    Number of Visits 13    Date for PT Re-Evaluation 01/13/20    PT Start Time 9628    PT Stop Time 1610    PT Time Calculation (min) 55 min    Activity Tolerance Patient tolerated treatment well    Behavior During Therapy [facility] for tasks assessed/performed           Past Medical History:  Diagnosis Date  . Allergy   . Arthritis   . Frequent falls   . Heart murmur   . Hemorrhoids    past hx   . Low blood sugar   . Thyroid disease     Past Surgical History:  Procedure Laterality Date  . ABDOMINAL HYSTERECTOMY     partial  . BREAST CYST EXCISION Bilateral 1983  . BUNIONECTOMY    . CARPAL TUNNEL RELEASE Left 2017  . DENTAL SURGERY    . THYROID SURGERY Bilateral 1970    There were no vitals filed for this visit.   Subjective Assessment - 12/23/19 1532    Subjective She relays her neck isnt doing bad but her Rt leg is bothering her more, has a "lump" around her Rt lateral knee    How long can you sit comfortably? unlimited    How long can you stand comfortably? unlimited    How long can you walk comfortably? unlimited    Diagnostic tests nothing for the neck and back    Pain Onset More than a month ago              [facility] Adult PT Treatment/Exercise - 12/23/19 0001      Neck Exercises: Standing   Other Standing Exercises Rows blue band X 20 reps      Lumbar Exercises: Stretches   Active Hamstring Stretch Right;Left;2 reps;30 seconds    Prone on Elbows Stretch 60 seconds;2 reps    Press Ups 15 reps    Piriformis Stretch Right;Left;2 reps;30 seconds      Lumbar Exercises: Aerobic   Nustep Nu  step L5 but stopped after 2 minutes due to pain      Lumbar Exercises: Supine   Clam 15 reps    Clam Limitations blue    Bridge 15 reps;5 seconds      Modalities   Modalities Electrical Stimulation;Moist Research officer, political party Location Lt neck and Lt knee    Electrical Stimulation Action pre mod to both    Electrical Stimulation Parameters to tolerance in sitting with heat to neck    Electrical Stimulation Goals Pain;Tone      Manual Therapy   Manual therapy comments long axis distraction unilat to both sides      Neck Exercises: Stretches   Upper Trapezius Stretch Left;2 reps;30 seconds                  PT Education - 12/23/19 1600    Education Details HEP review    Person(s) Educated Patient    Methods Explanation;Demonstration;Verbal cues    Comprehension Verbalized understanding;Returned demonstration  PT Short Term Goals - 12/02/19 1528      PT SHORT TERM GOAL #1   Title pt to be I with inital HEP    Time 3    Period Weeks    Status New    Target Date 12/23/19             PT Long Term Goals - 12/02/19 1528      PT LONG TERM GOAL #1   Title pt to decrease L upper trap and surrounding musculature to promote cervical L rotation to >/= 5 degrees    Time 6    Period Weeks    Status New    Target Date 01/13/20      PT LONG TERM GOAL #2   Title pt to report decreased LE referral to </= 1 x a week for improvement of condition    Time 6    Period Weeks    Status New    Target Date 01/13/20      PT LONG TERM GOAL #3   Title increase bil gross shoulder strength to >/= 4+/5 for functional strength and lifting mechanics    Time 6    Period Weeks    Status New    Target Date 01/13/20      PT LONG TERM GOAL #4   Title increase gross hip strength to >/= 4+/5 to promote hip and trunk stability    Time 6    Period Weeks    Status New    Target Date 01/13/20      PT LONG TERM GOAL #5   Title pt to be  I with all HEP given to maintain and progress current level of function    Time 6    Period Weeks    Status New    Target Date 01/13/20                 Plan - 12/23/19 1601    Clinical Impression Statement Session focused on HEP review and exercises for lumbar radiculopathy and Lt neck/trap tightness. She had overall good tolerance to all activities except for nu step machine which caused more pain in her Lt leg so this was discontinued. She was trialed with heat and TENS post tx to reduce tightness and pain after activity. Appeared to respond well to [facility] exercises. Continue POC.    Personal Factors and Comorbidities Comorbidity 1    Comorbidities hx of frequent falls    Stability/Clinical Decision Making Evolving/Moderate complexity    Rehab Potential Good    PT Frequency 1x / week    PT Duration 6 weeks    PT Treatment/Interventions ADLs/Self Care Home Management;Cryotherapy;Electrical Stimulation;Iontophoresis 4mg /ml Dexamethasone;Moist Heat;Ultrasound;Traction;Therapeutic activities;Therapeutic exercise;Stair training;Gait training;Neuromuscular re-education;Patient/family education;Manual techniques;Passive range of motion;Dry needling;Taping    PT Next Visit Plan review/ update HEP PRN, assess berg balance due to frequent falls, extension bias progress if tolerated well, gross shoulder/ hip strength,    PT Home Exercise Plan [id number] - upper trap stretch, (seated) chin tuck, Rows, prone press up, sidelying hip abduction, (standing) hip flexor stretch    Consulted and Agree with Plan of Care Patient           Patient will benefit from skilled therapeutic intervention in order to improve the following deficits and impairments:  Improper body mechanics, Decreased strength, Pain, Decreased activity tolerance, Decreased endurance, Postural dysfunction  Visit Diagnosis: Cervicalgia  Chronic bilateral low back pain with bilateral sciatica  Muscle weakness  (generalized)  Repeated  falls     Problem List Patient Active Problem List   Diagnosis Date Noted  . Chronic hepatitis C without hepatic coma ([place]) 11/29/2019  . Chronic left-sided low back pain without sciatica 11/23/2019  . Cervical radiculopathy 11/23/2019  . Knee mass, left 11/23/2019  . Pre-diabetes 06/27/2019  . Numbness 07/21/2018  . Status post carpal tunnel release 04/17/2016  . Physical exam 05/03/2015  . OAB (overactive bladder) 11/02/2014  . Post-surgical hypothyroidism 10/10/2014  . Visual loss 10/10/2014  . Hypotension 10/10/2014  . Chronic back pain 09/07/2013  . Paresthesia of hand, bilateral 09/07/2013  . Recurrent falls 01/17/2013  . TOBACCO ABUSE 05/15/2010  . GOITER 05/14/2010  . Hyperlipidemia 05/14/2010  . HYPOCALCEMIA 05/14/2010  . HYPOPARATHYROIDISM 03/14/2010    Debbe Odea, PT,DPT 12/23/2019, 4:03 PM  [facility] [address] [place], [place], [address] Phone: [phone]   Fax:  [phone]  Name: Tricia Woodward MRN: [id number] Date of Birth: [birth year]

## 2019-12-28 ENCOUNTER — Other Ambulatory Visit: Payer: Self-pay

## 2019-12-28 ENCOUNTER — Encounter: Payer: Self-pay | Admitting: Physical Therapy

## 2019-12-28 ENCOUNTER — Ambulatory Visit (INDEPENDENT_AMBULATORY_CARE_PROVIDER_SITE_OTHER): Payer: 59 | Admitting: Physical Therapy

## 2019-12-28 DIAGNOSIS — M5442 Lumbago with sciatica, left side: Secondary | ICD-10-CM

## 2019-12-28 DIAGNOSIS — M542 Cervicalgia: Secondary | ICD-10-CM | POA: Diagnosis not present

## 2019-12-28 DIAGNOSIS — M6281 Muscle weakness (generalized): Secondary | ICD-10-CM | POA: Diagnosis not present

## 2019-12-28 DIAGNOSIS — R296 Repeated falls: Secondary | ICD-10-CM | POA: Diagnosis not present

## 2019-12-28 DIAGNOSIS — M5441 Lumbago with sciatica, right side: Secondary | ICD-10-CM

## 2019-12-28 DIAGNOSIS — G8929 Other chronic pain: Secondary | ICD-10-CM

## 2019-12-28 NOTE — Therapy (Signed)
[place], [place], [address] Phone: [phone]   Fax:  [phone]  Physical Therapy Treatment  Patient Details  Name: Tricia Woodward MRN: [id number] Date of Birth: 09/09/1955 Referring Provider (PT): Leandrew Koyanagi, MD    Encounter Date: 12/28/2019   PT End of Session - 12/28/19 1319    Visit Number 3    Number of Visits 13    Date for PT Re-Evaluation 01/13/20    PT Start Time 1751    PT Stop Time 1150    PT Time Calculation (min) 48 min    Activity Tolerance Patient tolerated treatment well    Behavior During Therapy [facility] for tasks assessed/performed           Past Medical History:  Diagnosis Date  . Allergy   . Arthritis   . Frequent falls   . Heart murmur   . Hemorrhoids    past hx   . Low blood sugar   . Thyroid disease     Past Surgical History:  Procedure Laterality Date  . ABDOMINAL HYSTERECTOMY     partial  . BREAST CYST EXCISION Bilateral 1983  . BUNIONECTOMY    . CARPAL TUNNEL RELEASE Left 2017  . DENTAL SURGERY    . THYROID SURGERY Bilateral 1970    There were no vitals filed for this visit.   Subjective Assessment - 12/28/19 1308    Subjective she relays her leg pain is intermittent not bothering her that much right now, bigggest issue is neck and shoulder pain that is Lt sided today but can switch over and back from Rt side to Lt side.    How long can you sit comfortably? unlimited    How long can you stand comfortably? unlimited    How long can you walk comfortably? unlimited    Diagnostic tests nothing for the neck and back    Pain Score 3     Pain Location Neck    Pain Orientation Left    Pain Onset More than a month ago             [facility] Adult PT Treatment/Exercise - 12/28/19 0001      Neck Exercises: Theraband   Shoulder Extension 20 reps;Green    Rows 20 reps;Green      Neck Exercises: Seated   Neck Retraction 10 reps;5 secs      Moist Heat Therapy   Number  Minutes Moist Heat 12 Minutes    Moist Heat Location Cervical      Electrical Stimulation   Electrical Stimulation Location bilat neck/traps    Electrical Stimulation Action IFC 12 min with heat    Electrical Stimulation Parameters tolerance in supine    Electrical Stimulation Goals Pain;Tone      Manual Therapy   Manual therapy comments gentle cervical distraction, S.O release, lateral cervical glides, STM and T.P. release to bilat traps      Neck Exercises: Stretches   Upper Trapezius Stretch Right;Left;3 reps;30 seconds                    PT Short Term Goals - 12/02/19 1528      PT SHORT TERM GOAL #1   Title pt to be I with inital HEP    Time 3    Period Weeks    Status New    Target Date 12/23/19             PT Long Term Goals -  12/02/19 1528      PT LONG TERM GOAL #1   Title pt to decrease L upper trap and surrounding musculature to promote cervical L rotation to >/= 5 degrees    Time 6    Period Weeks    Status New    Target Date 01/13/20      PT LONG TERM GOAL #2   Title pt to report decreased LE referral to </= 1 x a week for improvement of condition    Time 6    Period Weeks    Status New    Target Date 01/13/20      PT LONG TERM GOAL #3   Title increase bil gross shoulder strength to >/= 4+/5 for functional strength and lifting mechanics    Time 6    Period Weeks    Status New    Target Date 01/13/20      PT LONG TERM GOAL #4   Title increase gross hip strength to >/= 4+/5 to promote hip and trunk stability    Time 6    Period Weeks    Status New    Target Date 01/13/20      PT LONG TERM GOAL #5   Title pt to be I with all HEP given to maintain and progress current level of function    Time 6    Period Weeks    Status New    Target Date 01/13/20                 Plan - 12/28/19 1322    Clinical Impression Statement Not having much leg pain today so session focused on neck/shoulder stretching and strengthening to  tolerance along with manual therapy and modaltaties to reduce overall pain and muscle tightness. She does relay she feels better after session. Continue POC    Personal Factors and Comorbidities Comorbidity 1    Comorbidities hx of frequent falls    Stability/Clinical Decision Making Evolving/Moderate complexity    Rehab Potential Good    PT Frequency 1x / week    PT Duration 6 weeks    PT Treatment/Interventions ADLs/Self Care Home Management;Cryotherapy;Electrical Stimulation;Iontophoresis 4mg /ml Dexamethasone;Moist Heat;Ultrasound;Traction;Therapeutic activities;Therapeutic exercise;Stair training;Gait training;Neuromuscular re-education;Patient/family education;Manual techniques;Passive range of motion;Dry needling;Taping    PT Next Visit Plan review/ update HEP PRN, assess berg balance due to frequent falls, extension bias progress if tolerated well, gross shoulder/ hip strength,    PT Home Exercise Plan [id number] - upper trap stretch, (seated) chin tuck, Rows, prone press up, sidelying hip abduction, (standing) hip flexor stretch    Consulted and Agree with Plan of Care Patient           Patient will benefit from skilled therapeutic intervention in order to improve the following deficits and impairments:  Improper body mechanics, Decreased strength, Pain, Decreased activity tolerance, Decreased endurance, Postural dysfunction  Visit Diagnosis: Cervicalgia  Chronic bilateral low back pain with bilateral sciatica  Muscle weakness (generalized)  Repeated falls     Problem List Patient Active Problem List   Diagnosis Date Noted  . Chronic hepatitis C without hepatic coma ([place]) 11/29/2019  . Chronic left-sided low back pain without sciatica 11/23/2019  . Cervical radiculopathy 11/23/2019  . Knee mass, left 11/23/2019  . Pre-diabetes 06/27/2019  . Numbness 07/21/2018  . Status post carpal tunnel release 04/17/2016  . Physical exam 05/03/2015  . OAB (overactive bladder)  11/02/2014  . Post-surgical hypothyroidism 10/10/2014  . Visual loss 10/10/2014  . Hypotension 10/10/2014  .  Chronic back pain 09/07/2013  . Paresthesia of hand, bilateral 09/07/2013  . Recurrent falls 01/17/2013  . TOBACCO ABUSE 05/15/2010  . GOITER 05/14/2010  . Hyperlipidemia 05/14/2010  . HYPOCALCEMIA 05/14/2010  . HYPOPARATHYROIDISM 03/14/2010    Silvestre Mesi 12/28/2019, 1:37 PM  [facility] [address] [place], [place], [address] Phone: [phone]   Fax:  [phone]  Name: BREELLA VANOSTRAND MRN: [id number] Date of Birth: November 15, 1955

## 2019-12-30 ENCOUNTER — Other Ambulatory Visit: Payer: 59

## 2019-12-30 ENCOUNTER — Ambulatory Visit
Admission: RE | Admit: 2019-12-30 | Discharge: 2019-12-30 | Disposition: A | Discharge status: Home / Self Care | Payer: 59 | Source: Ambulatory Visit | Attending: Orthopaedic Surgery | Admitting: Orthopaedic Surgery

## 2019-12-30 ENCOUNTER — Other Ambulatory Visit: Payer: Self-pay

## 2019-12-30 DIAGNOSIS — R2242 Localized swelling, mass and lump, left lower limb: Secondary | ICD-10-CM

## 2020-01-03 ENCOUNTER — Ambulatory Visit (INDEPENDENT_AMBULATORY_CARE_PROVIDER_SITE_OTHER): Payer: 59 | Admitting: Family Medicine

## 2020-01-03 ENCOUNTER — Other Ambulatory Visit: Payer: Self-pay

## 2020-01-03 ENCOUNTER — Encounter: Payer: Self-pay | Admitting: Family Medicine

## 2020-01-03 VITALS — BP 118/80 | HR 78 | Temp 97.9°F | Resp 16 | Ht 68.0 in | Wt 145.4 lb

## 2020-01-03 DIAGNOSIS — Z0000 Encounter for general adult medical examination without abnormal findings: Secondary | ICD-10-CM

## 2020-01-03 DIAGNOSIS — B182 Chronic viral hepatitis C: Secondary | ICD-10-CM | POA: Diagnosis not present

## 2020-01-03 DIAGNOSIS — E785 Hyperlipidemia, unspecified: Secondary | ICD-10-CM

## 2020-01-03 LAB — CBC WITH DIFFERENTIAL/PLATELET
Basophils Absolute: 0.1 10*3/uL (ref 0.0–0.1)
Basophils Relative: 1 % (ref 0.0–3.0)
Eosinophils Absolute: 0.1 10*3/uL (ref 0.0–0.7)
Eosinophils Relative: 2.6 % (ref 0.0–5.0)
HCT: 39.9 % (ref 36.0–46.0)
Hemoglobin: 13.3 g/dL (ref 12.0–15.0)
Lymphocytes Relative: 50.1 % — ABNORMAL HIGH (ref 12.0–46.0)
Lymphs Abs: 2.6 10*3/uL (ref 0.7–4.0)
MCHC: 33.3 g/dL (ref 30.0–36.0)
MCV: 93.2 fl (ref 78.0–100.0)
Monocytes Absolute: 0.9 10*3/uL (ref 0.1–1.0)
Monocytes Relative: 16.8 % — ABNORMAL HIGH (ref 3.0–12.0)
Neutro Abs: 1.5 10*3/uL (ref 1.4–7.7)
Neutrophils Relative %: 29.5 % — ABNORMAL LOW (ref 43.0–77.0)
Platelets: 273 10*3/uL (ref 150.0–400.0)
RBC: 4.28 Mil/uL (ref 3.87–5.11)
RDW: 15.2 % (ref 11.5–15.5)
WBC: 5.1 10*3/uL (ref 4.0–10.5)

## 2020-01-03 LAB — LIPID PANEL
Cholesterol: 221 mg/dL — ABNORMAL HIGH (ref 0–200)
HDL: 112.1 mg/dL (ref >39.00)
LDL Cholesterol: 96 mg/dL (ref 0–99)
NonHDL: 109.29
Total CHOL/HDL Ratio: 2
Triglycerides: 64 mg/dL (ref 0.0–149.0)
VLDL: 12.8 mg/dL (ref 0.0–40.0)

## 2020-01-03 LAB — HEPATIC FUNCTION PANEL
ALT: 117 U/L — ABNORMAL HIGH (ref 0–35)
AST: 112 U/L — ABNORMAL HIGH (ref 0–37)
Albumin: 4 g/dL (ref 3.5–5.2)
Alkaline Phosphatase: 68 U/L (ref 39–117)
Bilirubin, Direct: 0.1 mg/dL (ref 0.0–0.3)
Total Bilirubin: 0.6 mg/dL (ref 0.2–1.2)
Total Protein: 7.5 g/dL (ref 6.0–8.3)

## 2020-01-03 LAB — BASIC METABOLIC PANEL
BUN: 16 mg/dL (ref 6–23)
CO2: 30 mEq/L (ref 19–32)
Calcium: 9.5 mg/dL (ref 8.4–10.5)
Chloride: 103 mEq/L (ref 96–112)
Creatinine, Ser: 0.94 mg/dL (ref 0.40–1.20)
GFR: 72.34 mL/min (ref >60.00)
Glucose, Bld: 79 mg/dL (ref 70–99)
Potassium: 4.9 mEq/L (ref 3.5–5.1)
Sodium: 141 mEq/L (ref 135–145)

## 2020-01-03 LAB — TSH: TSH: 1.05 u[IU]/mL (ref 0.35–4.50)

## 2020-01-03 NOTE — Assessment & Plan Note (Signed)
Chronic problem, tolerating statin w/o difficulty.  Check labs.  Adjust meds prn  

## 2020-01-03 NOTE — Patient Instructions (Addendum)
Follow up in 6 months to recheck cholesterol We'll notify you of your lab results and make any changes if needed Quit smoking!  You can do it! Call with any questions or concerns Have a great summer!!   Preventive Care 65-65 Years Old, Female Preventive care refers to visits with your health care provider and lifestyle choices that can promote health and wellness. This includes:  A yearly physical exam. This may also be called an annual well check.  Regular dental visits and eye exams.  Immunizations.  Screening for certain conditions.  Healthy lifestyle choices, such as eating a healthy diet, getting regular exercise, not using drugs or products that contain nicotine and tobacco, and limiting alcohol use. What can I expect for my preventive care visit? Physical exam Your health care provider will check your:  Height and weight. This may be used to calculate body mass index (BMI), which tells if you are at a healthy weight.  Heart rate and blood pressure.  Skin for abnormal spots. Counseling Your health care provider may ask you questions about your:  Alcohol, tobacco, and drug use.  Emotional well-being.  Home and relationship well-being.  Sexual activity.  Eating habits.  Work and work Statistician.  Method of birth control.  Menstrual cycle.  Pregnancy history. What immunizations do I need?  Influenza (flu) vaccine  This is recommended every year. Tetanus, diphtheria, and pertussis (Tdap) vaccine  You may need a Td booster every 10 years. Varicella (chickenpox) vaccine  You may need this if you have not been vaccinated. Zoster (shingles) vaccine  You may need this after age 65. Measles, mumps, and rubella (MMR) vaccine  You may need at least one dose of MMR if you were born in 1957 or later. You may also need a second dose. Pneumococcal conjugate (PCV13) vaccine  You may need this if you have certain conditions and were not previously  vaccinated. Pneumococcal polysaccharide (PPSV23) vaccine  You may need one or two doses if you smoke cigarettes or if you have certain conditions. Meningococcal conjugate (MenACWY) vaccine  You may need this if you have certain conditions. Hepatitis A vaccine  You may need this if you have certain conditions or if you travel or work in places where you may be exposed to hepatitis A. Hepatitis B vaccine  You may need this if you have certain conditions or if you travel or work in places where you may be exposed to hepatitis B. Haemophilus influenzae type b (Hib) vaccine  You may need this if you have certain conditions. Human papillomavirus (HPV) vaccine  If recommended by your health care provider, you may need three doses over 6 months. You may receive vaccines as individual doses or as more than one vaccine together in one shot (combination vaccines). Talk with your health care provider about the risks and benefits of combination vaccines. What tests do I need? Blood tests  Lipid and cholesterol levels. These may be checked every 5 years, or more frequently if you are over 53 years old.  Hepatitis C test.  Hepatitis B test. Screening  Lung cancer screening. You may have this screening every year starting at age 65 if you have a 30-pack-year history of smoking and currently smoke or have quit within the past 15 years.  Colorectal cancer screening. All adults should have this screening starting at age 65 and continuing until age 11. Your health care provider may recommend screening at age 37 if you are at increased risk. You will  have tests every 1-10 years, depending on your results and the type of screening test.  Diabetes screening. This is done by checking your blood sugar (glucose) after you have not eaten for a while (fasting). You may have this done every 1-3 years.  Mammogram. This may be done every 1-2 years. Talk with your health care provider about when you should start  having regular mammograms. This may depend on whether you have a family history of breast cancer.  BRCA-related cancer screening. This may be done if you have a family history of breast, ovarian, tubal, or peritoneal cancers.  Pelvic exam and Pap test. This may be done every 3 years starting at age 65. Starting at age 65, this may be done every 5 years if you have a Pap test in combination with an HPV test. Other tests  Sexually transmitted disease (STD) testing.  Bone density scan. This is done to screen for osteoporosis. You may have this scan if you are at high risk for osteoporosis. Follow these instructions at home: Eating and drinking  Eat a diet that includes fresh fruits and vegetables, whole grains, lean protein, and low-fat dairy.  Take vitamin and mineral supplements as recommended by your health care provider.  Do not drink alcohol if: ? Your health care provider tells you not to drink. ? You are pregnant, may be pregnant, or are planning to become pregnant.  If you drink alcohol: ? Limit how much you have to 0-1 drink a day. ? Be aware of how much alcohol is in your drink. In the [place] one drink equals one 12 oz bottle of beer (355 mL), one 5 oz glass of wine (148 mL), or one 1 oz glass of hard liquor (44 mL). Lifestyle  Take daily care of your teeth and gums.  Stay active. Exercise for at least 30 minutes on 5 or more days each week.  Do not use any products that contain nicotine or tobacco, such as cigarettes, e-cigarettes, and chewing tobacco. If you need help quitting, ask your health care provider.  If you are sexually active, practice safe sex. Use a condom or other form of birth control (contraception) in order to prevent pregnancy and STIs (sexually transmitted infections).  If told by your health care provider, take low-dose aspirin daily starting at age 65. What's next?  Visit your health care provider once a year for a well check visit.  Ask your health  care provider how often you should have your eyes and teeth checked.  Stay up to date on all vaccines. This information is not intended to replace advice given to you by your health care provider. Make sure you discuss any questions you have with your health care provider. Document Revised: 02/11/2018 Document Reviewed: 02/11/2018 Elsevier Patient Education  2020 [employer].

## 2020-01-03 NOTE — Assessment & Plan Note (Addendum)
Now following w/ infectious disease and going to start treatment soon- waiting on medication.

## 2020-01-03 NOTE — Progress Notes (Signed)
   Subjective:    Patient ID: Tricia Woodward, female    DOB: [birth year], 64 y.o.   MRN: [id number]  HPI CPE- UTD on Tdap, refused COVID and flu.  UTD on colonoscopy, mammo   Review of Systems Patient reports no vision/ hearing changes, adenopathy,fever, weight change,  persistant/recurrent hoarseness , swallowing issues, chest pain, palpitations, edema, persistant/recurrent cough, hemoptysis, dyspnea (rest/exertional/paroxysmal nocturnal), gastrointestinal bleeding (melena, rectal bleeding), abdominal pain, significant heartburn, bowel changes, GU symptoms (dysuria, hematuria, incontinence), Gyn symptoms (abnormal  bleeding, pain),  syncope, focal weakness, memory loss, numbness & tingling, hair/nail changes, abnormal bruising or bleeding, anxiety, or depression.  + skin changes   This visit occurred during the SARS-CoV-2 public health emergency.  Safety protocols were in place, including screening questions prior to the visit, additional usage of staff PPE, and extensive cleaning of exam room while observing appropriate contact time as indicated for disinfecting solutions.       Objective:   Physical Exam General Appearance:    Alert, cooperative, no distress, appears stated age  Head:    Normocephalic, without obvious abnormality, atraumatic  Eyes:    PERRL, conjunctiva/corneas clear, EOM's intact, fundi    benign, both eyes  Ears:    Normal TM's and external ear canals, both ears  Nose:   Deferred due to COVID  Throat:   Neck:   Supple, symmetrical, trachea midline, no adenopathy;    Thyroid: no enlargement/tenderness/nodules  Back:     Symmetric, no curvature, ROM normal, no CVA tenderness  Lungs:     Clear to auscultation bilaterally, respirations unlabored  Chest Wall:    No tenderness or deformity   Heart:    Regular rate and rhythm, S1 and S2 normal, no murmur, rub   or gallop  Breast Exam:    Deferred to mammo  Abdomen:     Soft, non-tender, bowel sounds active all four  quadrants,    no masses, no organomegaly  Genitalia:    Deferred  Rectal:    Extremities:   Extremities normal, atraumatic, no cyanosis or edema  Pulses:   2+ and symmetric all extremities  Skin:   Skin color, texture, turgor normal, no rashes or lesions  Lymph nodes:   Cervical, supraclavicular, and axillary nodes normal  Neurologic:   CNII-XII intact, normal strength, sensation and reflexes    throughout          Assessment & Plan:

## 2020-01-03 NOTE — Assessment & Plan Note (Signed)
Pt's PE WNL and unchanged from previous.  UTD on colonoscopy, mammo.  Refusing COVID and flu shots.  Check labs.  Anticipatory guidance provided.

## 2020-01-04 ENCOUNTER — Encounter: Payer: Self-pay | Admitting: General Practice

## 2020-01-04 ENCOUNTER — Ambulatory Visit (INDEPENDENT_AMBULATORY_CARE_PROVIDER_SITE_OTHER): Payer: 59 | Admitting: Orthopaedic Surgery

## 2020-01-04 VITALS — Ht 68.0 in | Wt 145.0 lb

## 2020-01-04 DIAGNOSIS — M1712 Unilateral primary osteoarthritis, left knee: Secondary | ICD-10-CM | POA: Insufficient documentation

## 2020-01-04 DIAGNOSIS — R2242 Localized swelling, mass and lump, left lower limb: Secondary | ICD-10-CM

## 2020-01-04 NOTE — Progress Notes (Signed)
Office Visit Note   Patient: Tricia Woodward           Date of Birth: 1956/04/27           MRN: [id number] Visit Date: 01/04/2020              Requested by: Midge Minium, MD [address] [place],  [place] [address] PCP: Midge Minium, MD   Assessment & Plan: Visit Diagnoses:  1. Knee mass, left   2. Primary osteoarthritis of left knee     Plan: MRI shows advanced tricompartmental chondromalacia and DJD.  She has an extra-articular ganglion cyst laterally.  This does not appear to be worrisome mass.  Based on our discussion treatment options she would like to hold off on any treatments for now.  Mainly she wanted reassurance with MRI.  We will see her back as needed.  Follow-Up Instructions: Return if symptoms worsen or fail to improve.   Orders:  No orders of the defined types were placed in this encounter.  No orders of the defined types were placed in this encounter.     Procedures: No procedures performed   Clinical Data: No additional findings.   Subjective: Chief Complaint  Patient presents with  . Left Knee - Pain, Follow-up    MRI Left Knee Review    [name] returns today for right left knee MRI review.   Review of Systems  Constitutional: Negative.   HENT: Negative.   Eyes: Negative.   Respiratory: Negative.   Cardiovascular: Negative.   Endocrine: Negative.   Musculoskeletal: Negative.   Neurological: Negative.   Hematological: Negative.   Psychiatric/Behavioral: Negative.   All other systems reviewed and are negative.    Objective: Vital Signs: Ht 5\' 8"  (1.727 m)   Wt 145 lb (65.8 kg)   BMI 22.05 kg/m   Physical Exam Vitals and nursing note reviewed.  Constitutional:      Appearance: She is well-developed.  Pulmonary:     Effort: Pulmonary effort is normal.  Skin:    General: Skin is warm.     Capillary Refill: Capillary refill takes less than 2 seconds.  Neurological:     Mental Status: She is alert and  oriented to person, place, and time.  Psychiatric:        Behavior: Behavior normal.        Thought Content: Thought content normal.        Judgment: Judgment normal.     Ortho Exam Left knee exam shows no joint effusion.  Small palpable ganglion cyst on the lateral side. Specialty Comments:  No specialty comments available.  Imaging: No results found.   PMFS History: Patient Active Problem List   Diagnosis Date Noted  . Primary osteoarthritis of left knee 01/04/2020  . Chronic hepatitis C without hepatic coma ([place]) 11/29/2019  . Chronic left-sided low back pain without sciatica 11/23/2019  . Cervical radiculopathy 11/23/2019  . Knee mass, left 11/23/2019  . Pre-diabetes 06/27/2019  . Numbness 07/21/2018  . Status post carpal tunnel release 04/17/2016  . Physical exam 05/03/2015  . OAB (overactive bladder) 11/02/2014  . Post-surgical hypothyroidism 10/10/2014  . Visual loss 10/10/2014  . Hypotension 10/10/2014  . Chronic back pain 09/07/2013  . Paresthesia of hand, bilateral 09/07/2013  . Recurrent falls 01/17/2013  . TOBACCO ABUSE 05/15/2010  . GOITER 05/14/2010  . Hyperlipidemia 05/14/2010  . HYPOCALCEMIA 05/14/2010  . HYPOPARATHYROIDISM 03/14/2010   Past Medical History:  Diagnosis Date  .  Allergy   . Arthritis   . Frequent falls   . Heart murmur   . Hemorrhoids    past hx   . Low blood sugar   . Thyroid disease     Family History  Problem Relation Age of Onset  . Hypertension Father   . Hypertension Sister   . Cirrhosis Mother   . Arthritis Other   . Hyperlipidemia Other   . Aneurysm Other   . Colon cancer Neg Hx   . Esophageal cancer Neg Hx   . Rectal cancer Neg Hx   . Stomach cancer Neg Hx   . Breast cancer Neg Hx     Past Surgical History:  Procedure Laterality Date  . ABDOMINAL HYSTERECTOMY     partial  . BREAST CYST EXCISION Bilateral 1983  . BUNIONECTOMY    . CARPAL TUNNEL RELEASE Left 2017  . DENTAL SURGERY    . THYROID SURGERY  Bilateral 1970   Social History   Occupational History  . Not on file  Tobacco Use  . Smoking status: Heavy Tobacco Smoker    Packs/day: 0.50    Types: Cigarettes    Start date: 12/16/1978  . Smokeless tobacco: Never Used  . Tobacco comment: e cigs at times  Vaping Use  . Vaping Use: Former  Substance and Sexual Activity  . Alcohol use: Yes    Alcohol/week: 5.0 standard drinks    Types: 2 Glasses of wine, 3 Cans of beer per week    Comment: 4-5  drinks per times a week   . Drug use: No  . Sexual activity: Not Currently

## 2020-01-10 ENCOUNTER — Encounter: Payer: Self-pay | Admitting: Pharmacist

## 2020-01-10 ENCOUNTER — Encounter: Payer: Self-pay | Admitting: Family Medicine

## 2020-01-11 ENCOUNTER — Ambulatory Visit (INDEPENDENT_AMBULATORY_CARE_PROVIDER_SITE_OTHER): Payer: 59 | Admitting: Physical Therapy

## 2020-01-11 ENCOUNTER — Other Ambulatory Visit: Payer: Self-pay

## 2020-01-11 ENCOUNTER — Other Ambulatory Visit: Payer: Self-pay | Admitting: Pharmacist

## 2020-01-11 ENCOUNTER — Encounter: Payer: Self-pay | Admitting: Pharmacist

## 2020-01-11 DIAGNOSIS — M542 Cervicalgia: Secondary | ICD-10-CM | POA: Diagnosis not present

## 2020-01-11 DIAGNOSIS — G8929 Other chronic pain: Secondary | ICD-10-CM

## 2020-01-11 DIAGNOSIS — E7849 Other hyperlipidemia: Secondary | ICD-10-CM

## 2020-01-11 DIAGNOSIS — M6281 Muscle weakness (generalized): Secondary | ICD-10-CM

## 2020-01-11 DIAGNOSIS — R296 Repeated falls: Secondary | ICD-10-CM

## 2020-01-11 DIAGNOSIS — M5442 Lumbago with sciatica, left side: Secondary | ICD-10-CM | POA: Diagnosis not present

## 2020-01-11 DIAGNOSIS — M5441 Lumbago with sciatica, right side: Secondary | ICD-10-CM

## 2020-01-11 MED ORDER — ROSUVASTATIN CALCIUM 10 MG PO TABS: 10.0000 mg | ORAL_TABLET | Freq: Every day | ORAL | 0 refills | Status: DC

## 2020-01-11 NOTE — Therapy (Signed)
[name], [place], [address] Phone: [phone]   Fax:  [phone]  Physical Therapy Treatment  Patient Details  Name: Tricia Woodward MRN: [id number] Date of Birth: [birth year] Referring Provider (PT): Tricia Koyanagi, MD    Encounter Date: 01/11/2020   PT End of Session - 01/11/20 1151    Visit Number 4    Number of Visits 13    Date for PT Re-Evaluation 01/13/20    PT Start Time 1055    PT Stop Time 1145    PT Time Calculation (min) 50 min    Activity Tolerance Patient tolerated treatment well    Behavior During Therapy [facility] for tasks assessed/performed           Past Medical History:  Diagnosis Date  . Allergy   . Arthritis   . Frequent falls   . Heart murmur   . Hemorrhoids    past hx   . Low blood sugar   . Thyroid disease     Past Surgical History:  Procedure Laterality Date  . ABDOMINAL HYSTERECTOMY     partial  . BREAST CYST EXCISION Bilateral 1983  . BUNIONECTOMY    . CARPAL TUNNEL RELEASE Left 2017  . DENTAL SURGERY    . THYROID SURGERY Bilateral 1970    There were no vitals filed for this visit.   Subjective Assessment - 01/11/20 1107    Subjective Relays that overall pain is better, still gets some tightness and spasms in her Lt uppper trap area but not as bad as it was.    How long can you sit comfortably? unlimited    How long can you stand comfortably? unlimited    How long can you walk comfortably? unlimited    Diagnostic tests nothing for the neck and back    Pain Onset More than a month ago                             [facility] Adult PT Treatment/Exercise - 01/11/20 0001      Neck Exercises: Machines for Strengthening   Cybex Row 20 lbs 2X10    Cybex Chest Press 10 lbs 2X10    Lat Pull 15 lbs 2 X10 reps      Neck Exercises: Seated   Neck Retraction 20 reps    Other Seated Exercise bilat ER and bilat horizonal Abd both with red 2X10 reps    Other Seated  Exercise warm up on pulleys 2 min flexion, 2 min scaption      Moist Heat Therapy   Number Minutes Moist Heat 12 Minutes    Moist Heat Location Cervical;Lumbar Spine      Electrical Stimulation   Electrical Stimulation Location bilat neck/traps    Electrical Stimulation Action IFC 12 min with heat    Electrical Stimulation Parameters tolreance in supine with heat    Electrical Stimulation Goals Pain;Tone      Manual Therapy   Manual therapy comments gentle cervical distraction, S.O release, STM and T.P. release to Lt traps, cervical PROM into sidebend and into rotation      Neck Exercises: Stretches   Upper Trapezius Stretch Right;Left;3 reps;30 seconds                    PT Short Term Goals - 12/02/19 1528      PT SHORT TERM GOAL #1   Title pt to be I with  inital HEP    Time 3    Period Weeks    Status New    Target Date 12/23/19             PT Long Term Goals - 12/02/19 1528      PT LONG TERM GOAL #1   Title pt to decrease L upper trap and surrounding musculature to promote cervical L rotation to >/= 5 degrees    Time 6    Period Weeks    Status New    Target Date 01/13/20      PT LONG TERM GOAL #2   Title pt to report decreased LE referral to </= 1 x a week for improvement of condition    Time 6    Period Weeks    Status New    Target Date 01/13/20      PT LONG TERM GOAL #3   Title increase bil gross shoulder strength to >/= 4+/5 for functional strength and lifting mechanics    Time 6    Period Weeks    Status New    Target Date 01/13/20      PT LONG TERM GOAL #4   Title increase gross hip strength to >/= 4+/5 to promote hip and trunk stability    Time 6    Period Weeks    Status New    Target Date 01/13/20      PT LONG TERM GOAL #5   Title pt to be I with all HEP given to maintain and progress current level of function    Time 6    Period Weeks    Status New    Target Date 01/13/20                 Plan - 01/11/20 1151     Clinical Impression Statement Pain levels have been better managed lately. She is improving with overall strength and ROM. PT will update measurments next session. She was able to progress her resistance training today with good tolreance.    Personal Factors and Comorbidities Comorbidity 1    Comorbidities hx of frequent falls    Stability/Clinical Decision Making Evolving/Moderate complexity    Rehab Potential Good    PT Frequency 1x / week    PT Duration 6 weeks    PT Treatment/Interventions ADLs/Self Care Home Management;Cryotherapy;Electrical Stimulation;Iontophoresis 4mg /ml Dexamethasone;Moist Heat;Ultrasound;Traction;Therapeutic activities;Therapeutic exercise;Stair training;Gait training;Neuromuscular re-education;Patient/family education;Manual techniques;Passive range of motion;Dry needling;Taping    PT Next Visit Plan focus more on neck shoulder as this has been her biggest complaint lately    PT Home Exercise Plan [phone] - upper trap stretch, (seated) chin tuck, Rows, prone press up, sidelying hip abduction, (standing) hip flexor stretch    Consulted and Agree with Plan of Care Patient           Patient will benefit from skilled therapeutic intervention in order to improve the following deficits and impairments:  Improper body mechanics, Decreased strength, Pain, Decreased activity tolerance, Decreased endurance, Postural dysfunction  Visit Diagnosis: Cervicalgia  Chronic bilateral low back pain with bilateral sciatica  Muscle weakness (generalized)  Repeated falls     Problem List Patient Active Problem List   Diagnosis Date Noted  . Primary osteoarthritis of left knee 01/04/2020  . Chronic hepatitis C without hepatic coma ([place]) 11/29/2019  . Chronic left-sided low back pain without sciatica 11/23/2019  . Cervical radiculopathy 11/23/2019  . Knee mass, left 11/23/2019  . Pre-diabetes 06/27/2019  . Numbness 07/21/2018  .  Status post carpal tunnel release  04/17/2016  . Physical exam 05/03/2015  . OAB (overactive bladder) 11/02/2014  . Post-surgical hypothyroidism 10/10/2014  . Visual loss 10/10/2014  . Hypotension 10/10/2014  . Chronic back pain 09/07/2013  . Paresthesia of hand, bilateral 09/07/2013  . Recurrent falls 01/17/2013  . TOBACCO ABUSE 05/15/2010  . GOITER 05/14/2010  . Hyperlipidemia 05/14/2010  . HYPOCALCEMIA 05/14/2010  . HYPOPARATHYROIDISM 03/14/2010    Debbe Odea ,PT,DPT 01/11/2020, 11:53 AM  [facility] Physical Therapy [address] [place], [place], [address] Phone: [phone]   Fax:  [phone]  Name: Tricia Woodward MRN: [id number] Date of Birth: 1956/02/12

## 2020-01-14 ENCOUNTER — Other Ambulatory Visit: Payer: Self-pay | Admitting: Family Medicine

## 2020-01-14 DIAGNOSIS — E209 Hypoparathyroidism, unspecified: Secondary | ICD-10-CM

## 2020-01-14 DIAGNOSIS — E890 Postprocedural hypothyroidism: Secondary | ICD-10-CM

## 2020-01-15 ENCOUNTER — Encounter: Payer: Self-pay | Admitting: Family Medicine

## 2020-01-18 ENCOUNTER — Encounter: Payer: Self-pay | Admitting: Physical Therapy

## 2020-01-18 ENCOUNTER — Other Ambulatory Visit: Payer: Self-pay

## 2020-01-18 ENCOUNTER — Ambulatory Visit (INDEPENDENT_AMBULATORY_CARE_PROVIDER_SITE_OTHER): Payer: 59 | Admitting: Physical Therapy

## 2020-01-18 DIAGNOSIS — M6281 Muscle weakness (generalized): Secondary | ICD-10-CM | POA: Diagnosis not present

## 2020-01-18 DIAGNOSIS — M5442 Lumbago with sciatica, left side: Secondary | ICD-10-CM | POA: Diagnosis not present

## 2020-01-18 DIAGNOSIS — M542 Cervicalgia: Secondary | ICD-10-CM | POA: Diagnosis not present

## 2020-01-18 DIAGNOSIS — R296 Repeated falls: Secondary | ICD-10-CM | POA: Diagnosis not present

## 2020-01-18 DIAGNOSIS — G8929 Other chronic pain: Secondary | ICD-10-CM

## 2020-01-18 DIAGNOSIS — M5441 Lumbago with sciatica, right side: Secondary | ICD-10-CM

## 2020-01-18 NOTE — Therapy (Signed)
[facility] [place], [place], [address] Phone: [phone]   Fax:  [phone]  Physical Therapy Treatment  Patient Details  Name: Tricia Woodward MRN: [id number] Date of Birth: May 27, 1956 Referring Provider (PT): Leandrew Koyanagi, MD    Encounter Date: 01/18/2020   PT End of Session - 01/18/20 1140    Visit Number 5    Number of Visits 13    Date for PT Re-Evaluation 01/13/20    PT Start Time 1100    PT Stop Time 1150    PT Time Calculation (min) 50 min    Activity Tolerance Patient tolerated treatment well    Behavior During Therapy [facility] for tasks assessed/performed           Past Medical History:  Diagnosis Date  . Allergy   . Arthritis   . Frequent falls   . Heart murmur   . Hemorrhoids    past hx   . Low blood sugar   . Thyroid disease     Past Surgical History:  Procedure Laterality Date  . ABDOMINAL HYSTERECTOMY     partial  . BREAST CYST EXCISION Bilateral 1983  . BUNIONECTOMY    . CARPAL TUNNEL RELEASE Left 2017  . DENTAL SURGERY    . THYROID SURGERY Bilateral 1970    There were no vitals filed for this visit.   Subjective Assessment - 01/18/20 1120    Subjective Relays overall feeling pretty good today only mild pain noted in Lt neck/trap area. She has been doing water exercises which are helping her with back pain.    How long can you sit comfortably? unlimited    How long can you stand comfortably? unlimited    How long can you walk comfortably? unlimited    Diagnostic tests nothing for the neck and back    Pain Onset More than a month ago            [facility] Adult PT Treatment/Exercise - 01/18/20 0001      Neck Exercises: Machines for Strengthening   Cybex Row 20 lbs 2X10    Cybex Chest Press 10 lbs 2X10    Lat Pull 15 lbs 2 X10 reps    Other Machines for Strengthening resisted walking with cable machine 5 lbs on each side X 5 reps fwd and 5 reps retro      Neck Exercises: Theraband    Shoulder Extension 20 reps;Green    Rows 20 reps;Green      Neck Exercises: Seated   Neck Retraction 20 reps    Other Seated Exercise bilat ER and bilat horizonal Abd both with red 2X10 reps    Other Seated Exercise warm up on UBE LE/UE 2 min fwd, 2 min retro      Moist Heat Therapy   Number Minutes Moist Heat 12 Minutes    Moist Heat Location Cervical;Lumbar Spine      Electrical Stimulation   Electrical Stimulation Location bilat neck/traps    Electrical Stimulation Action IFC 12 min supine with heat    Electrical Stimulation Parameters tolerance    Electrical Stimulation Goals Pain      Neck Exercises: Stretches   Upper Trapezius Stretch Right;Left;3 reps;30 seconds                    PT Short Term Goals - 01/18/20 1144      PT SHORT TERM GOAL #1   Title pt to be I with inital HEP  Time 3    Period Weeks    Status Achieved    Target Date 12/23/19             PT Long Term Goals - 01/18/20 1145      PT LONG TERM GOAL #1   Title pt to decrease L upper trap and surrounding musculature to promote cervical L rotation to >/= 5 degrees    Time 6    Period Weeks    Status On-going      PT LONG TERM GOAL #2   Title pt to report decreased LE referral to </= 1 x a week for improvement of condition    Time 6    Period Weeks    Status Achieved      PT LONG TERM GOAL #3   Title increase bil gross shoulder strength to >/= 4+/5 for functional strength and lifting mechanics    Time 6    Period Weeks    Status On-going      PT LONG TERM GOAL #4   Title increase gross hip strength to >/= 4+/5 to promote hip and trunk stability    Time 6    Period Weeks    Status On-going      PT LONG TERM GOAL #5   Title pt to be I with all HEP given to maintain and progress current level of function    Time 6    Period Weeks    Status On-going                 Plan - 01/18/20 1142    Clinical Impression Statement Showing improvements in strength and ROM. Able  to progress her strength training exercises with good toleance. Continued with IFC Estim and heat post tx to reduce pain and tightness after workout. Continue POC    Personal Factors and Comorbidities Comorbidity 1    Comorbidities hx of frequent falls    Stability/Clinical Decision Making Evolving/Moderate complexity    Rehab Potential Good    PT Frequency 1x / week    PT Duration 6 weeks    PT Treatment/Interventions ADLs/Self Care Home Management;Cryotherapy;Electrical Stimulation;Iontophoresis 4mg /ml Dexamethasone;Moist Heat;Ultrasound;Traction;Therapeutic activities;Therapeutic exercise;Stair training;Gait training;Neuromuscular re-education;Patient/family education;Manual techniques;Passive range of motion;Dry needling;Taping    PT Next Visit Plan focus more on neck shoulder as this has been her biggest complaint lately    PT Home Exercise Plan [phone] - upper trap stretch, (seated) chin tuck, Rows, prone press up, sidelying hip abduction, (standing) hip flexor stretch    Consulted and Agree with Plan of Care Patient           Patient will benefit from skilled therapeutic intervention in order to improve the following deficits and impairments:  Improper body mechanics, Decreased strength, Pain, Decreased activity tolerance, Decreased endurance, Postural dysfunction  Visit Diagnosis: Cervicalgia  Chronic bilateral low back pain with bilateral sciatica  Muscle weakness (generalized)  Repeated falls     Problem List Patient Active Problem List   Diagnosis Date Noted  . Primary osteoarthritis of left knee 01/04/2020  . Chronic hepatitis C without hepatic coma ([place]) 11/29/2019  . Chronic left-sided low back pain without sciatica 11/23/2019  . Cervical radiculopathy 11/23/2019  . Knee mass, left 11/23/2019  . Pre-diabetes 06/27/2019  . Numbness 07/21/2018  . Status post carpal tunnel release 04/17/2016  . Physical exam 05/03/2015  . OAB (overactive bladder) 11/02/2014  .  Post-surgical hypothyroidism 10/10/2014  . Visual loss 10/10/2014  . Hypotension 10/10/2014  . Chronic  back pain 09/07/2013  . Paresthesia of hand, bilateral 09/07/2013  . Recurrent falls 01/17/2013  . TOBACCO ABUSE 05/15/2010  . GOITER 05/14/2010  . Hyperlipidemia 05/14/2010  . HYPOCALCEMIA 05/14/2010  . HYPOPARATHYROIDISM 03/14/2010    Silvestre Mesi 01/18/2020, 11:45 AM  [facility] Physical Therapy [address] [place], [place], [address] Phone: [phone]   Fax:  [phone]  Name: Tricia Woodward MRN: [id number] Date of Birth: 1956/04/16

## 2020-01-23 ENCOUNTER — Other Ambulatory Visit: Payer: Self-pay | Admitting: Family Medicine

## 2020-01-23 ENCOUNTER — Encounter: Payer: Self-pay | Admitting: Family Medicine

## 2020-01-23 DIAGNOSIS — E890 Postprocedural hypothyroidism: Secondary | ICD-10-CM

## 2020-01-23 DIAGNOSIS — E209 Hypoparathyroidism, unspecified: Secondary | ICD-10-CM

## 2020-01-23 MED ORDER — LEVOTHYROXINE SODIUM 50 MCG PO TABS: 50.0000 ug | ORAL_TABLET | Freq: Two times a day (BID) | ORAL | 0 refills | Status: DC

## 2020-01-23 MED ORDER — TRIAMCINOLONE ACETONIDE 0.1 % EX OINT: 1.0000 "application " | TOPICAL_OINTMENT | Freq: Two times a day (BID) | CUTANEOUS | 1 refills | Status: AC

## 2020-01-23 MED ORDER — TRAMADOL HCL 50 MG PO TABS: 50.0000 mg | ORAL_TABLET | Freq: Two times a day (BID) | ORAL | 0 refills | Status: DC | PRN

## 2020-01-23 NOTE — Telephone Encounter (Signed)
Second request

## 2020-01-23 NOTE — Telephone Encounter (Signed)
Last OV 01/03/20 Tramadol last filled 07/22/19 #30 with 0

## 2020-01-24 ENCOUNTER — Other Ambulatory Visit: Payer: Self-pay | Admitting: General Practice

## 2020-01-24 MED ORDER — LEVOTHYROXINE SODIUM 100 MCG PO TABS: 100.0000 ug | ORAL_TABLET | Freq: Every day | ORAL | 1 refills | Status: DC

## 2020-01-25 ENCOUNTER — Other Ambulatory Visit: Payer: Self-pay

## 2020-01-25 ENCOUNTER — Ambulatory Visit (INDEPENDENT_AMBULATORY_CARE_PROVIDER_SITE_OTHER): Payer: Medicare Other | Admitting: Pharmacist

## 2020-01-25 ENCOUNTER — Other Ambulatory Visit: Payer: Self-pay | Admitting: Pharmacist

## 2020-01-25 ENCOUNTER — Encounter: Payer: Self-pay | Admitting: Family Medicine

## 2020-01-25 DIAGNOSIS — B182 Chronic viral hepatitis C: Secondary | ICD-10-CM

## 2020-01-25 NOTE — Progress Notes (Signed)
HPI: Tricia Woodward is a 64 y.o. female who presents to the [place] clinic for Hepatitis C follow-up.  Medication: Mavyret x 8 weeks  Start Date: 12/26/19  Hepatitis C Genotype: 1b   Fibrosis Score: no cirrhosis  Hepatitis C RNA: 1.8 million on 11/04/19  Patient Active Problem List   Diagnosis Date Noted  . Primary osteoarthritis of left knee 01/04/2020  . Chronic hepatitis C without hepatic coma ([place]) 11/29/2019  . Chronic left-sided low back pain without sciatica 11/23/2019  . Cervical radiculopathy 11/23/2019  . Knee mass, left 11/23/2019  . Pre-diabetes 06/27/2019  . Numbness 07/21/2018  . Status post carpal tunnel release 04/17/2016  . Physical exam 05/03/2015  . OAB (overactive bladder) 11/02/2014  . Post-surgical hypothyroidism 10/10/2014  . Visual loss 10/10/2014  . Hypotension 10/10/2014  . Chronic back pain 09/07/2013  . Paresthesia of hand, bilateral 09/07/2013  . Recurrent falls 01/17/2013  . TOBACCO ABUSE 05/15/2010  . GOITER 05/14/2010  . Hyperlipidemia 05/14/2010  . HYPOCALCEMIA 05/14/2010  . HYPOPARATHYROIDISM 03/14/2010    Patient's Medications  New Prescriptions   No medications on file  Previous Medications   ASPIRIN 81 MG TABLET    Take 81 mg by mouth daily.    CALCITRIOL (ROCALTROL) 0.5 MCG CAPSULE    Take 3 capsules (1.5 mcg total) by mouth daily.   CHOLECALCIFEROL (VITAMIN D3) 25 MCG (1000 UT) TABLET    Take 1,000 Units by mouth daily.    DICLOFENAC SODIUM (VOLTAREN) 1 % GEL    Apply 2 g topically 4 (four) times daily.   FISH OIL-OMEGA-3 FATTY ACIDS 1000 MG CAPSULE    Take 2 g by mouth daily.    GLECAPREVIR-PIBRENTASVIR (MAVYRET) 100-40 MG TABS    Take 3 tablets by mouth daily with breakfast.   LEVOTHYROXINE (EUTHYROX) 100 MCG TABLET    Take 1 tablet (100 mcg total) by mouth daily before breakfast.   METHOCARBAMOL (ROBAXIN) 500 MG TABLET    Take 1 tablet (500 mg total) by mouth at bedtime as needed for muscle spasms.   MULTIPLE VITAMIN  (MULTIVITAMIN) TABLET    Take 1 tablet by mouth daily.   PREDNISONE (DELTASONE) 10 MG TABLET    3 tabs x3 days and then 2 tabs x3 days and then 1 tab x3 days.  Take w/ food.   PREDNISONE (STERAPRED UNI-PAK 21 TAB) 10 MG (21) TBPK TABLET    Take as directed   ROSUVASTATIN (CRESTOR) 10 MG TABLET    Take 1 tablet (10 mg total) by mouth daily.   ROSUVASTATIN (CRESTOR) 20 MG TABLET    Take 20 mg by mouth daily.   TRAMADOL (ULTRAM) 50 MG TABLET    Take 1 tablet (50 mg total) by mouth 2 (two) times daily as needed.   TRIAMCINOLONE OINTMENT (KENALOG) 0.1 %    Apply 1 application topically 2 (two) times daily.   VITAMIN B-12 (CYANOCOBALAMIN) 1000 MCG TABLET    Take 1,000 mcg by mouth daily.  Modified Medications   No medications on file  Discontinued Medications   No medications on file    Allergies: Allergies  Allergen Reactions  . Penicillins     REACTION: Yeast Infections    Past Medical History: Past Medical History:  Diagnosis Date  . Allergy   . Arthritis   . Frequent falls   . Heart murmur   . Hemorrhoids    past hx   . Low blood sugar   . Thyroid disease  Social History: Social History   Socioeconomic History  . Marital status: Single    Spouse name: Not on file  . Number of children: Not on file  . Years of education: Not on file  . Highest education level: Not on file  Occupational History  . Not on file  Tobacco Use  . Smoking status: Heavy Tobacco Smoker    Packs/day: 0.50    Types: Cigarettes    Start date: 12/16/1978  . Smokeless tobacco: Never Used  . Tobacco comment: e cigs at times  Vaping Use  . Vaping Use: Former  Substance and Sexual Activity  . Alcohol use: Yes    Alcohol/week: 5.0 standard drinks    Types: 2 Glasses of wine, 3 Cans of beer per week    Comment: 4-5  drinks per times a week   . Drug use: No  . Sexual activity: Not Currently  Other Topics Concern  . Not on file  Social History Narrative  . Not on file   Social Determinants  of Health   Financial Resource Strain:   . Difficulty of Paying Living Expenses:   Food Insecurity:   . Worried About Charity fundraiser in the Last Year:   . Arboriculturist in the Last Year:   Transportation Needs:   . Film/video editor (Medical):   Marland Kitchen Lack of Transportation (Non-Medical):   Physical Activity:   . Days of Exercise per Week:   . Minutes of Exercise per Session:   Stress:   . Feeling of Stress :   Social Connections:   . Frequency of Communication with Friends and Family:   . Frequency of Social Gatherings with Friends and Family:   . Attends Religious Services:   . Active Member of Clubs or Organizations:   . Attends Archivist Meetings:   Marland Kitchen Marital Status:     Labs: Hepatitis C Lab Results  Component Value Date   HCVGENOTYPE 1b 11/28/2019   HCVRNAPCRQN 1,890,000 (H) 11/04/2019   Hepatitis B Lab Results  Component Value Date   HEPBSAB REACTIVE (A) 11/28/2019   HEPBSAG NON-REACTIVE 11/28/2019   HEPBCAB Positive (A) 11/04/2019   Hepatitis A No results found for: HAV HIV Lab Results  Component Value Date   HIV NON-REACTIVE 11/28/2019   Lab Results  Component Value Date   CREATININE 0.94 01/03/2020   CREATININE 0.89 09/26/2019   CREATININE 0.76 06/27/2019   CREATININE 0.70 02/04/2019   CREATININE 0.77 07/21/2018   Lab Results  Component Value Date   AST 112 (H) 01/03/2020   AST 43 (H) 11/28/2019   AST 68 (H) 11/04/2019   ALT 117 (H) 01/03/2020   ALT 49 (H) 11/28/2019   ALT 52 (H) 11/28/2019   INR 1.0 11/28/2019    Assessment: Tricia Woodward is here today for her 4 week follow up appointment for her chronic Hepatitis C infection.  She started 8 weeks of Mavyret back in mid-July and is doing well so far. She has noticed increased fatigue and headaches but states that it is tolerable. She is in the last week of her 1st month and only has one month remaining of her treatment. She has missed no doses so far. Congratulated her on this  and encouraged her to continue being adherent. Will check labs today, have her come back in 4 weeks for end of treatment labs, and then have her see Marya Amsler for her cure visit.  Plan: - Continue Mavyret x 8 weeks - Hep  C RNA + CMET today - F/u for labs 9/22 at 11am - F/u with Marya Amsler for cure visit 1/3 at [place]. [place] Throckmorton, PharmD, BCIDP, AAHIVP, CPP Clinical Pharmacist Practitioner Infectious Diseases [facility] 01/25/2020, 10:49 AM

## 2020-01-27 ENCOUNTER — Encounter: Payer: Self-pay | Admitting: Physical Therapy

## 2020-01-27 ENCOUNTER — Ambulatory Visit (INDEPENDENT_AMBULATORY_CARE_PROVIDER_SITE_OTHER): Payer: Medicare Other | Admitting: Physical Therapy

## 2020-01-27 ENCOUNTER — Other Ambulatory Visit: Payer: Self-pay

## 2020-01-27 DIAGNOSIS — M6281 Muscle weakness (generalized): Secondary | ICD-10-CM

## 2020-01-27 DIAGNOSIS — G8929 Other chronic pain: Secondary | ICD-10-CM

## 2020-01-27 DIAGNOSIS — M5441 Lumbago with sciatica, right side: Secondary | ICD-10-CM

## 2020-01-27 DIAGNOSIS — M5442 Lumbago with sciatica, left side: Secondary | ICD-10-CM | POA: Diagnosis not present

## 2020-01-27 DIAGNOSIS — M542 Cervicalgia: Secondary | ICD-10-CM | POA: Diagnosis not present

## 2020-01-27 DIAGNOSIS — R296 Repeated falls: Secondary | ICD-10-CM

## 2020-01-27 NOTE — Therapy (Signed)
[facility] Physical Therapy [address] [place], [place], [address] Phone: [phone]   Fax:  [phone]  Physical Therapy Treatment / Discharge  Patient Details  Name: Tricia Woodward MRN: [id number] Date of Birth: 03/11/1956 Referring Provider (PT): Leandrew Koyanagi, MD    Encounter Date: 01/27/2020   PT End of Session - 01/27/20 1343    Visit Number 6    Number of Visits 13    Date for PT Re-Evaluation 01/27/20    PT Start Time 2595    PT Stop Time 1423    PT Time Calculation (min) 38 min    Activity Tolerance Patient tolerated treatment well    Behavior During Therapy [facility] for tasks assessed/performed           Past Medical History:  Diagnosis Date  . Allergy   . Arthritis   . Frequent falls   . Heart murmur   . Hemorrhoids    past hx   . Low blood sugar   . Thyroid disease     Past Surgical History:  Procedure Laterality Date  . ABDOMINAL HYSTERECTOMY     partial  . BREAST CYST EXCISION Bilateral 1983  . BUNIONECTOMY    . CARPAL TUNNEL RELEASE Left 2017  . DENTAL SURGERY    . THYROID SURGERY Bilateral 1970    There were no vitals filed for this visit.   Subjective Assessment - 01/27/20 1344    Subjective "I've really done well, no pain. I do water exercises regularly, and I do have some issues with staying consistent with my exercise."    Diagnostic tests nothing for the neck and back    Currently in Pain? Yes    Aggravating Factors  weather    Pain Score 0              [facility] PT Assessment - 01/27/20 0001      Assessment   Medical Diagnosis Chronic left-sided low back pain without sciatica M54.5, G89.29, Cervical radiculopathy M54.12, Knee mass, left R22.42    Referring Provider (PT) Leandrew Koyanagi, MD       AROM   Cervical Flexion 58    Cervical Extension 68    Cervical - Right Side Bend 41   contralateral end range stiffness noted   Cervical - Left Side Bend 42    Cervical - Right Rotation 70    Cervical - Left  Rotation 66      Strength   Right Shoulder Flexion 4+/5    Right Shoulder Extension 5/5    Right Shoulder ABduction 4/5    Right Shoulder Internal Rotation 5/5    Right Shoulder External Rotation 4+/5    Left Shoulder Flexion 4+/5    Left Shoulder Extension 5/5    Left Shoulder ABduction 4/5    Left Shoulder Internal Rotation 5/5    Left Shoulder External Rotation 4/5    Right Hip Flexion 4/5    Right Hip Extension 4+/5    Right Hip ABduction 4+/5    Left Hip Flexion 4/5    Left Hip Extension 4+/5    Left Hip ABduction 4+/5                         [facility] Adult PT Treatment/Exercise - 01/27/20 0001      Neck Exercises: Theraband   Other Theraband Exercises scapular retraction 2 x 10 with green theraband      Lumbar Exercises: Stretches  Hip Flexor Stretch Right;Left;1 rep;30 seconds      Lumbar Exercises: Seated   Sit to Stand 15 reps      Lumbar Exercises: Sidelying   Hip Abduction Both;10 reps      Neck Exercises: Stretches   Upper Trapezius Stretch Left;2 reps;30 seconds    Levator Stretch 2 reps;30 seconds                  PT Education - 01/27/20 1410    Education Details reviewed HEP / updated today for sit to stand and money exercise. Discussed importance of consistency and gradually increasing reps/ sets to promote endurance. importance of being proactive vs reactive to prevent pain for returning.    Person(s) Educated Patient    Methods Explanation;Verbal cues;Handout    Comprehension Verbalized understanding;Verbal cues required            PT Short Term Goals - 01/18/20 1144      PT SHORT TERM GOAL #1   Title pt to be I with inital HEP    Time 3    Period Weeks    Status Achieved    Target Date 12/23/19             PT Long Term Goals - 01/27/20 1354      PT LONG TERM GOAL #1   Title pt to decrease L upper trap and surrounding musculature to promote cervical L rotation to >/= 5 degrees    Period Weeks    Status  Partially Met      PT LONG TERM GOAL #2   Title pt to report decreased LE referral to </= 1 x a week for improvement of condition    Period Weeks    Status Achieved      PT LONG TERM GOAL #3   Title increase bil gross shoulder strength to >/= 4+/5 for functional strength and lifting mechanics    Baseline all muscles ar 4+/5 or higher except shoulder abduction at 4/5    Period Weeks    Status Partially Met      PT LONG TERM GOAL #4   Title increase gross hip strength to >/= 4+/5 to promote hip and trunk stability    Baseline all hip strength 4+/5 except for hip flexion bil 4/5    Time 6    Status Partially Met      PT LONG TERM GOAL #5   Title pt to be I with all HEP given to maintain and progress current level of function    Period Weeks                 Plan - 01/27/20 1423    Clinical Impression Statement Mrs Fiorenza has made excellent progress with physical therapy increase neck mobility, increase shoulder and hip strength and additional reports no pain today. she does not intermittent pain at 2/10 which is weather related. reviewed HEP and discussed importance of consistency. she did very well with all exercises today. she met or partially met all goals today and is able to maintain and progress her current LOF IND and will be discharged from PT today.    PT Treatment/Interventions ADLs/Self Care Home Management;Cryotherapy;Electrical Stimulation;Iontophoresis 8m/ml Dexamethasone;Moist Heat;Ultrasound;Traction;Therapeutic activities;Therapeutic exercise;Stair training;Gait training;Neuromuscular re-education;Patient/family education;Manual techniques;Passive range of motion;Dry needling;Taping    PT Next Visit Plan D/C today    PT Home Exercise Plan [phone]- upper trap stretch, (seated) chin tuck, Rows, prone press up, sidelying hip abduction, (standing) hip flexor stretch, sit  to stand, money    Consulted and Agree with Plan of Care Patient           Patient will  benefit from skilled therapeutic intervention in order to improve the following deficits and impairments:  Improper body mechanics, Decreased strength, Pain, Decreased activity tolerance, Decreased endurance, Postural dysfunction  Visit Diagnosis: Repeated falls  Cervicalgia  Chronic bilateral low back pain with bilateral sciatica  Muscle weakness (generalized)     Problem List Patient Active Problem List   Diagnosis Date Noted  . Primary osteoarthritis of left knee 01/04/2020  . Chronic hepatitis C without hepatic coma ([place]) 11/29/2019  . Chronic left-sided low back pain without sciatica 11/23/2019  . Cervical radiculopathy 11/23/2019  . Knee mass, left 11/23/2019  . Pre-diabetes 06/27/2019  . Numbness 07/21/2018  . Status post carpal tunnel release 04/17/2016  . Physical exam 05/03/2015  . OAB (overactive bladder) 11/02/2014  . Post-surgical hypothyroidism 10/10/2014  . Visual loss 10/10/2014  . Hypotension 10/10/2014  . Chronic back pain 09/07/2013  . Paresthesia of hand, bilateral 09/07/2013  . Recurrent falls 01/17/2013  . TOBACCO ABUSE 05/15/2010  . GOITER 05/14/2010  . Hyperlipidemia 05/14/2010  . HYPOCALCEMIA 05/14/2010  . HYPOPARATHYROIDISM 03/14/2010    [place] 01/27/2020, 2:25 PM  [facility] Physical Therapy [address] [place], [place], [address] Phone: [phone]   Fax:  [phone]  Name: ZAYNA TOSTE MRN: [id number] Date of Birth: 1955-11-10        PHYSICAL THERAPY DISCHARGE SUMMARY  Visits from Start of Care: 6  Current functional level related to goals / functional outcomes: See goals   Remaining deficits: See assessment   Education / Equipment: HEP, theraband, posture, lifting mechanics, anatomy of area's involved  Plan: Patient agrees to discharge.  Patient goals were partially met. Patient is being discharged due to being pleased with the current functional level.  ?????         Dagon Budai PT, DPT, LAT, ATC  01/27/20  2:26 PM

## 2020-01-28 LAB — COMPREHENSIVE METABOLIC PANEL
AG Ratio: 1.2 (calc) (ref 1.0–2.5)
ALT: 13 U/L (ref 6–29)
AST: 21 U/L (ref 10–35)
Albumin: 4.3 g/dL (ref 3.6–5.1)
Alkaline phosphatase (APISO): 47 U/L (ref 37–153)
BUN: 13 mg/dL (ref 7–25)
CO2: 29 mmol/L (ref 20–32)
Calcium: 9.6 mg/dL (ref 8.6–10.4)
Chloride: 103 mmol/L (ref 98–110)
Creat: 0.91 mg/dL (ref 0.50–0.99)
Globulin: 3.6 g/dL (calc) (ref 1.9–3.7)
Glucose, Bld: 87 mg/dL (ref 65–99)
Potassium: 4.4 mmol/L (ref 3.5–5.3)
Sodium: 139 mmol/L (ref 135–146)
Total Bilirubin: 0.8 mg/dL (ref 0.2–1.2)
Total Protein: 7.9 g/dL (ref 6.1–8.1)

## 2020-01-28 LAB — HEPATITIS C RNA QUANTITATIVE
HCV RNA, PCR, QN (Log): <1.18 log IU/mL — ABNORMAL HIGH
HCV RNA, PCR, QN: <15 IU/mL — ABNORMAL HIGH

## 2020-01-29 ENCOUNTER — Encounter: Payer: Self-pay | Admitting: Pharmacist

## 2020-01-30 ENCOUNTER — Encounter: Payer: 59 | Admitting: Physical Therapy

## 2020-02-06 ENCOUNTER — Encounter: Payer: 59 | Admitting: Physical Therapy

## 2020-02-06 DIAGNOSIS — M545 Low back pain: Secondary | ICD-10-CM | POA: Diagnosis not present

## 2020-02-06 DIAGNOSIS — M19022 Primary osteoarthritis, left elbow: Secondary | ICD-10-CM | POA: Diagnosis not present

## 2020-02-06 DIAGNOSIS — E785 Hyperlipidemia, unspecified: Secondary | ICD-10-CM | POA: Diagnosis not present

## 2020-02-17 ENCOUNTER — Encounter: Payer: 59 | Admitting: Physical Therapy

## 2020-02-28 NOTE — Telephone Encounter (Signed)
I'll leave this up to you to respond to this if you don't mind!

## 2020-03-01 ENCOUNTER — Encounter: Payer: Self-pay | Admitting: Family Medicine

## 2020-03-07 ENCOUNTER — Other Ambulatory Visit: Payer: Self-pay

## 2020-03-07 ENCOUNTER — Other Ambulatory Visit: Payer: Medicare Other

## 2020-03-07 ENCOUNTER — Telehealth: Payer: Self-pay

## 2020-03-07 DIAGNOSIS — Z79899 Other long term (current) drug therapy: Secondary | ICD-10-CM

## 2020-03-07 DIAGNOSIS — B182 Chronic viral hepatitis C: Secondary | ICD-10-CM

## 2020-03-07 LAB — LIPID PANEL
Cholesterol: 264 mg/dL — ABNORMAL HIGH (ref <200)
HDL: 71 mg/dL (ref >=50)
LDL Cholesterol (Calc): 172 mg/dL (calc) — ABNORMAL HIGH
Non-HDL Cholesterol (Calc): 193 mg/dL (calc) — ABNORMAL HIGH (ref <130)
Total CHOL/HDL Ratio: 3.7 (calc) (ref <5.0)
Triglycerides: 93 mg/dL (ref <150)

## 2020-03-07 NOTE — Telephone Encounter (Signed)
Patient came into clinic for labs. Requested to speak with a nurse to add cholesterol lab. Spoke with patient regarding lab request states that she has stopped taking Crestor and is doing home remedies for liver cleansing. Did not reach out to PCP or ID before d/c Crestor.  Order entered for lab. Encouraged patient to call doctors office before stopping any medication in the future. [place]

## 2020-03-11 LAB — HEPATITIS C RNA QUANTITATIVE
HCV RNA, PCR, QN (Log): <1.18 log IU/mL
HCV RNA, PCR, QN: <15 IU/mL

## 2020-03-12 ENCOUNTER — Encounter: Payer: Self-pay | Admitting: Pharmacist

## 2020-03-19 DIAGNOSIS — H905 Unspecified sensorineural hearing loss: Secondary | ICD-10-CM | POA: Diagnosis not present

## 2020-03-28 ENCOUNTER — Other Ambulatory Visit: Payer: Self-pay

## 2020-03-28 ENCOUNTER — Encounter: Payer: Self-pay | Admitting: Endocrinology

## 2020-03-28 ENCOUNTER — Ambulatory Visit (INDEPENDENT_AMBULATORY_CARE_PROVIDER_SITE_OTHER): Payer: Medicare Other | Admitting: Endocrinology

## 2020-03-28 VITALS — BP 110/68 | HR 93 | Ht 68.0 in | Wt 140.0 lb

## 2020-03-28 DIAGNOSIS — E209 Hypoparathyroidism, unspecified: Secondary | ICD-10-CM | POA: Diagnosis not present

## 2020-03-28 LAB — VITAMIN D 25 HYDROXY (VIT D DEFICIENCY, FRACTURES): VITD: 54.53 ng/mL (ref 30.00–100.00)

## 2020-03-28 LAB — PHOSPHORUS: Phosphorus: 4.6 mg/dL (ref 2.3–4.6)

## 2020-03-28 LAB — MAGNESIUM: Magnesium: 1.8 mg/dL (ref 1.5–2.5)

## 2020-03-28 NOTE — Patient Instructions (Signed)
It is expected that "Natpara" will not be available again until next year.   Blood tests are requested for you today.  We'll let you know about the results.  Please come back for a follow-up appointment in 6 months.

## 2020-03-28 NOTE — Progress Notes (Signed)
Subjective:    Patient ID: Tricia Woodward, female    DOB: January 30, 1956, 64 y.o.   MRN: [id number]  HPI Pt returns for f/u of postsurgical hypoparathyroidism (she had a thyroidectomy in the 1970's, in [place], for suspicious nodules, which proved on pathol to be benign; since then she has had hypoparathyroidism; in mid-2015, she was found to have low vitamin-D, and was rx'ed ergocalciferol; she also took forteo samples for a few mos in 2015, but had to d/c as it was declined by insurance; she is on chronic rocaltrol; [name] was added in Nov of 2017; [name] was later recalled)  She takes vit-D, 2000 units/day, and rocaltrol 3x0.5 mcg/d.  Main symptom is again fatigue.   She takes B-12 tabs 1/day.   Past Medical History:  Diagnosis Date   Allergy    Arthritis    Frequent falls    Heart murmur    Hemorrhoids    past hx    Low blood sugar    Thyroid disease     Past Surgical History:  Procedure Laterality Date   ABDOMINAL HYSTERECTOMY     partial   BREAST CYST EXCISION Bilateral 1983   BUNIONECTOMY     CARPAL TUNNEL RELEASE Left 2017   DENTAL SURGERY     THYROID SURGERY Bilateral 1970    Social History   Socioeconomic History   Marital status: Single    Spouse name: Not on file   Number of children: Not on file   Years of education: Not on file   Highest education level: Not on file  Occupational History   Not on file  Tobacco Use   Smoking status: Heavy Tobacco Smoker    Packs/day: 0.50    Types: Cigarettes    Start date: 12/16/1978   Smokeless tobacco: Never Used   Tobacco comment: e cigs at times  Vaping Use   Vaping Use: Former  Substance and Sexual Activity   Alcohol use: Yes    Alcohol/week: 5.0 standard drinks    Types: 2 Glasses of wine, 3 Cans of beer per week    Comment: 4-5  drinks per times a week    Drug use: No   Sexual activity: Not Currently  Other Topics Concern   Not on file  Social History Narrative   Not on file    Social Determinants of Health   Financial Resource Strain:    Difficulty of Paying Living Expenses: Not on file  Food Insecurity:    Worried About Running Out of Food in the Last Year: Not on file   Ran Out of Food in the Last Year: Not on file  Transportation Needs:    Lack of Transportation (Medical): Not on file   Lack of Transportation (Non-Medical): Not on file  Physical Activity:    Days of Exercise per Week: Not on file   Minutes of Exercise per Session: Not on file  Stress:    Feeling of Stress : Not on file  Social Connections:    Frequency of Communication with Friends and Family: Not on file   Frequency of Social Gatherings with Friends and Family: Not on file   Attends Religious Services: Not on file   Active Member of Clubs or Organizations: Not on file   Attends Archivist Meetings: Not on file   Marital Status: Not on file  Intimate Partner Violence:    Fear of Current or Ex-Partner: Not on file   Emotionally Abused: Not on file  Physically Abused: Not on file   Sexually Abused: Not on file    Current Outpatient Medications on File Prior to Visit  Medication Sig Dispense Refill   aspirin 81 MG tablet Take 81 mg by mouth daily.      cholecalciferol (VITAMIN D3) 25 MCG (1000 UT) tablet Take 1,000 Units by mouth daily.      diclofenac sodium (VOLTAREN) 1 % GEL Apply 2 g topically 4 (four) times daily. 1 Tube 5   fish oil-omega-3 fatty acids 1000 MG capsule Take 2 g by mouth daily.      Glecaprevir-Pibrentasvir (MAVYRET) 100-40 MG TABS Take 3 tablets by mouth daily with breakfast. 84 tablet 1   levothyroxine (EUTHYROX) 100 MCG tablet Take 1 tablet (100 mcg total) by mouth daily before breakfast. 90 tablet 1   methocarbamol (ROBAXIN) 500 MG tablet Take 1 tablet (500 mg total) by mouth at bedtime as needed for muscle spasms. 20 tablet 0   Multiple Vitamin (MULTIVITAMIN) tablet Take 1 tablet by mouth daily.     predniSONE  (DELTASONE) 10 MG tablet 3 tabs x3 days and then 2 tabs x3 days and then 1 tab x3 days.  Take w/ food. 18 tablet 0   predniSONE (STERAPRED UNI-PAK 21 TAB) 10 MG (21) TBPK tablet Take as directed 21 tablet 0   rosuvastatin (CRESTOR) 10 MG tablet Take 1 tablet (10 mg total) by mouth daily. 60 tablet 0   rosuvastatin (CRESTOR) 20 MG tablet Take 20 mg by mouth daily.     traMADol (ULTRAM) 50 MG tablet Take 1 tablet (50 mg total) by mouth 2 (two) times daily as needed. 30 tablet 0   triamcinolone ointment (KENALOG) 0.1 % Apply 1 application topically 2 (two) times daily. 90 g 1   vitamin B-12 (CYANOCOBALAMIN) 1000 MCG tablet Take 1,000 mcg by mouth daily.     No current facility-administered medications on file prior to visit.    Allergies  Allergen Reactions   Penicillins     REACTION: Yeast Infections    Family History  Problem Relation Age of Onset   Hypertension Father    Hypertension Sister    Cirrhosis Mother    Arthritis Other    Hyperlipidemia Other    Aneurysm Other    Colon cancer Neg Hx    Esophageal cancer Neg Hx    Rectal cancer Neg Hx    Stomach cancer Neg Hx    Breast cancer Neg Hx     BP 110/68    Pulse 93    Ht 5\' 8"  (1.727 m)    Wt 140 lb (63.5 kg)    SpO2 94%    BMI 21.29 kg/m    Review of Systems Denies numbness and muscle cramps.      Objective:   Physical Exam VITAL SIGNS:  See vs page GENERAL: no distress Pulses: dorsalis pedis intact bilat.   MSK: no deformity of the feet.  CV: no leg edema.   Skin:  no ulcer on the feet.  normal color and temp on the feet.  Neuro: sensation is intact to touch on the feet.     Lab Results  Component Value Date   PTH 3 (L) 03/28/2020   CALCIUM 9.4 03/28/2020   CAION 1.02 (L) 02/04/2019   PHOS 4.6 03/28/2020   25-OH vit-D=normal    Assessment & Plan:  Hyperarathyroidism: overcontrolled.  Reduce rocaltrol Vit-D def: well-controlled.  Please continue the same rx

## 2020-03-29 ENCOUNTER — Encounter: Payer: Self-pay | Admitting: Endocrinology

## 2020-03-29 LAB — PTH, INTACT AND CALCIUM
Calcium: 9.4 mg/dL (ref 8.6–10.4)
PTH: 3 pg/mL — ABNORMAL LOW (ref 14–64)

## 2020-03-29 MED ORDER — CALCITRIOL 0.5 MCG PO CAPS
1.0000 ug | ORAL_CAPSULE | Freq: Every day | ORAL | 11 refills | Status: DC
Start: 2020-03-29 — End: 2021-02-21

## 2020-04-09 DIAGNOSIS — Z20822 Contact with and (suspected) exposure to covid-19: Secondary | ICD-10-CM | POA: Diagnosis not present

## 2020-05-02 ENCOUNTER — Other Ambulatory Visit: Payer: Self-pay

## 2020-05-02 ENCOUNTER — Encounter: Payer: Self-pay | Admitting: Family Medicine

## 2020-05-03 ENCOUNTER — Other Ambulatory Visit: Payer: Self-pay

## 2020-05-03 DIAGNOSIS — E785 Hyperlipidemia, unspecified: Secondary | ICD-10-CM

## 2020-05-03 MED ORDER — ROSUVASTATIN CALCIUM 20 MG PO TABS: 20.0000 mg | ORAL_TABLET | Freq: Every day | ORAL | 0 refills | Status: DC

## 2020-06-18 ENCOUNTER — Ambulatory Visit: Payer: 59 | Admitting: Family

## 2020-06-19 ENCOUNTER — Other Ambulatory Visit: Payer: Self-pay | Admitting: Family Medicine

## 2020-06-19 DIAGNOSIS — E785 Hyperlipidemia, unspecified: Secondary | ICD-10-CM

## 2020-06-20 ENCOUNTER — Ambulatory Visit (INDEPENDENT_AMBULATORY_CARE_PROVIDER_SITE_OTHER): Payer: Medicare Other | Admitting: Family

## 2020-06-20 ENCOUNTER — Other Ambulatory Visit: Payer: Self-pay

## 2020-06-20 ENCOUNTER — Encounter: Payer: Self-pay | Admitting: Family

## 2020-06-20 VITALS — BP 89/56 | HR 102 | Temp 98.6°F | Wt 139.4 lb

## 2020-06-20 DIAGNOSIS — B182 Chronic viral hepatitis C: Secondary | ICD-10-CM

## 2020-06-20 NOTE — Assessment & Plan Note (Addendum)
Tricia Woodward complete her treatment in September without complications and viral load at the time was undetectable. Recheck Hepatitis C RNA level to determine sustained viremic response. Discussed recommendation for routine liver cancer screenings every 6 months through ultrasound with/without alpha fetoprotein as calculated fibrosis scores were elevated but did not show cirrhosis. If concern for infection in the future will need to recheck Hepatitis C RNA level as Hepatitis C antibody will always be positive. Follow up with ID as needed pending lab work results.

## 2020-06-20 NOTE — Progress Notes (Signed)
Subjective:    Patient ID: Tricia Woodward, female    DOB: [birth year], 65 y.o.   MRN: [id number]  Chief Complaint  Patient presents with  . Follow-up     HPI:  Tricia Woodward is a 65 y.o. female with Genotype 1b Chronic Hepatitis C with initial viral load of 1.89 million fibrosis score was unable to be calculated.  APRI score was 1.2 and FIB-4 was 2.47. Completed treatment with Mavyret and was last seen at end of treatment with Hepatitis C RNA level being undetectable. Here today for cure visit.  Tricia Woodward completed her medications without problems and has resumed her statin therapy with rosuvastatin. No current symptoms including abdominal pain, nausea, vomiting, diarrhea, or yellowing of her eyes or skin. She has been drinking alcohol less than she did previously.   Allergies  Allergen Reactions  . Penicillins     REACTION: Yeast Infections      Outpatient Medications Prior to Visit  Medication Sig Dispense Refill  . aspirin 81 MG tablet Take 81 mg by mouth daily.     . calcitRIOL (ROCALTROL) 0.5 MCG capsule Take 2 capsules (1 mcg total) by mouth daily. 60 capsule 11  . cholecalciferol (VITAMIN D3) 25 MCG (1000 UT) tablet Take 1,000 Units by mouth daily.     . diclofenac sodium (VOLTAREN) 1 % GEL Apply 2 g topically 4 (four) times daily. 1 Tube 5  . fish oil-omega-3 fatty acids 1000 MG capsule Take 2 g by mouth daily.     Marland Kitchen levothyroxine (EUTHYROX) 100 MCG tablet Take 1 tablet (100 mcg total) by mouth daily before breakfast. 90 tablet 1  . methocarbamol (ROBAXIN) 500 MG tablet Take 1 tablet (500 mg total) by mouth at bedtime as needed for muscle spasms. 20 tablet 0  . Multiple Vitamin (MULTIVITAMIN) tablet Take 1 tablet by mouth daily.    . predniSONE (DELTASONE) 10 MG tablet 3 tabs x3 days and then 2 tabs x3 days and then 1 tab x3 days.  Take w/ food. 18 tablet 0  . predniSONE (STERAPRED UNI-PAK 21 TAB) 10 MG (21) TBPK tablet Take as directed 21 tablet 0  . rosuvastatin  (CRESTOR) 20 MG tablet Take 1 tablet by mouth once daily 30 tablet 0  . traMADol (ULTRAM) 50 MG tablet Take 1 tablet (50 mg total) by mouth 2 (two) times daily as needed. 30 tablet 0  . triamcinolone ointment (KENALOG) 0.1 % Apply 1 application topically 2 (two) times daily. 90 g 1  . vitamin B-12 (CYANOCOBALAMIN) 1000 MCG tablet Take 1,000 mcg by mouth daily.    Marland Kitchen Glecaprevir-Pibrentasvir (MAVYRET) 100-40 MG TABS Take 3 tablets by mouth daily with breakfast. (Patient not taking: Reported on 06/20/2020) 84 tablet 1   No facility-administered medications prior to visit.     Past Medical History:  Diagnosis Date  . Allergy   . Arthritis   . Frequent falls   . Heart murmur   . Hemorrhoids    past hx   . Low blood sugar   . Thyroid disease      Past Surgical History:  Procedure Laterality Date  . ABDOMINAL HYSTERECTOMY     partial  . BREAST CYST EXCISION Bilateral 1983  . BUNIONECTOMY    . CARPAL TUNNEL RELEASE Left 2017  . DENTAL SURGERY    . THYROID SURGERY Bilateral 1970       Review of Systems  Constitutional: Negative for chills, fatigue, fever and unexpected weight change.  Respiratory: Negative  for cough, chest tightness, shortness of breath and wheezing.   Cardiovascular: Negative for chest pain and leg swelling.  Gastrointestinal: Negative for abdominal distention, constipation, diarrhea, nausea and vomiting.  Neurological: Negative for dizziness, weakness, light-headedness and headaches.  Hematological: Does not bruise/bleed easily.      Objective:    BP (!) 89/56   Pulse (!) 102   Temp 98.6 F (37 C) (Oral)   Wt 139 lb 6.4 oz (63.2 kg)   BMI 21.20 kg/m  Nursing note and vital signs reviewed.  Physical Exam Constitutional:      General: She is not in acute distress.    Appearance: She is well-developed.  HENT:     Ears:     Comments: Eyes appear with mild scleral icterus Cardiovascular:     Rate and Rhythm: Normal rate and regular rhythm.      Pulses: Intact distal pulses.     Heart sounds: Normal heart sounds. No murmur heard. No friction rub. No gallop.   Pulmonary:     Effort: Pulmonary effort is normal. No respiratory distress.     Breath sounds: Normal breath sounds. No wheezing or rales.  Chest:     Chest wall: No tenderness.  Abdominal:     General: Bowel sounds are normal. There is no distension or ascites.     Palpations: Abdomen is soft. There is no hepatosplenomegaly or mass.     Tenderness: There is no abdominal tenderness. There is no guarding or rebound.  Skin:    General: Skin is warm and dry.  Neurological:     Mental Status: She is alert and oriented to person, place, and time.  Psychiatric:        Mood and Affect: Mood and affect normal.        Behavior: Behavior normal.        Thought Content: Thought content normal.        Judgment: Judgment normal.      Depression screen [facility] [place] 2/9 01/03/2020 11/28/2019 08/15/2019 06/27/2019 07/14/2018  Decreased Interest 0 0 1 1 0  Down, Depressed, Hopeless 0 0 - 1 0  PHQ - 2 Score 0 0 1 2 0  Altered sleeping 0 - 0 0 -  Tired, decreased energy 0 - 0 0 -  Change in appetite 0 - 0 0 -  Feeling bad or failure about yourself  0 - 0 0 -  Trouble concentrating 0 - 0 0 -  Moving slowly or fidgety/restless 0 - 0 0 -  Suicidal thoughts 0 - 0 0 -  PHQ-9 Score 0 - 1 2 -  Difficult doing work/chores Not difficult at all - Not difficult at all Not difficult at all -       Assessment & Plan:    Patient Active Problem List   Diagnosis Date Noted  . Primary osteoarthritis of left knee 01/04/2020  . Chronic hepatitis C without hepatic coma ([place]) 11/29/2019  . Chronic left-sided low back pain without sciatica 11/23/2019  . Cervical radiculopathy 11/23/2019  . Knee mass, left 11/23/2019  . Pre-diabetes 06/27/2019  . Numbness 07/21/2018  . Status post carpal tunnel release 04/17/2016  . Physical exam 05/03/2015  . OAB (overactive bladder) 11/02/2014  . Post-surgical  hypothyroidism 10/10/2014  . Visual loss 10/10/2014  . Hypotension 10/10/2014  . Chronic back pain 09/07/2013  . Paresthesia of hand, bilateral 09/07/2013  . Recurrent falls 01/17/2013  . TOBACCO ABUSE 05/15/2010  . GOITER 05/14/2010  . Hyperlipidemia 05/14/2010  .  HYPOCALCEMIA 05/14/2010  . HYPOPARATHYROIDISM 03/14/2010     Problem List Items Addressed This Visit      Digestive   Chronic hepatitis C without hepatic coma ([place]) - Primary    Tricia Woodward complete her treatment in September without complications and viral load at the time was undetectable. Recheck Hepatitis C RNA level to determine sustained viremic response. Discussed recommendation for routine liver cancer screenings every 6 months through ultrasound with/without alpha fetoprotein as calculated fibrosis scores were elevated but did not show cirrhosis. If concern for infection in the future will need to recheck Hepatitis C RNA level as Hepatitis C antibody will always be positive. Follow up with ID as needed pending lab work results.       Relevant Orders   Hepatitis C RNA quantitative   Comp Met (CMET)       I am having Tricia Woodward maintain her aspirin, fish oil-omega-3 fatty acids, multivitamin, vitamin B-12, diclofenac sodium, cholecalciferol, predniSONE, methocarbamol, predniSONE, Mavyret, traMADol, triamcinolone ointment, levothyroxine, calcitRIOL, and rosuvastatin.   Follow-up: Return if symptoms worsen or fail to improve.   Terri Piedra, MSN, FNP-C Nurse Practitioner [facility] [place] number: [phone]

## 2020-06-20 NOTE — Patient Instructions (Addendum)
Nice to see you.  We will check your Hepatitis C RNA level today to determine cure.  If negative no further treatment is needed. If you need to be tested in the future they will need to check your Hepatitis C RNA level as the antibody test will always be positive.   Recommend routine liver cancer screenings every 6 months with ultrasound as you are at higher risk for fibrosis/cirrhosis.   No additional follow up is needed unless viral load is present a result of <15 is undetectable.  Have a great day and stay safe!

## 2020-06-28 LAB — COMPREHENSIVE METABOLIC PANEL
AG Ratio: 1.1 (calc) (ref 1.0–2.5)
ALT: 15 U/L (ref 6–29)
AST: 21 U/L (ref 10–35)
Albumin: 4 g/dL (ref 3.6–5.1)
Alkaline phosphatase (APISO): 44 U/L (ref 37–153)
BUN: 15 mg/dL (ref 7–25)
CO2: 29 mmol/L (ref 20–32)
Calcium: 8.9 mg/dL (ref 8.6–10.4)
Chloride: 102 mmol/L (ref 98–110)
Creat: 0.96 mg/dL (ref 0.50–0.99)
Globulin: 3.5 g/dL (calc) (ref 1.9–3.7)
Glucose, Bld: 82 mg/dL (ref 65–99)
Potassium: 4.3 mmol/L (ref 3.5–5.3)
Sodium: 141 mmol/L (ref 135–146)
Total Bilirubin: 0.6 mg/dL (ref 0.2–1.2)
Total Protein: 7.5 g/dL (ref 6.1–8.1)

## 2020-06-28 LAB — HEPATITIS C RNA QUANTITATIVE
HCV Quantitative Log: <1.18 log IU/mL
HCV RNA, PCR, QN: <15 IU/mL

## 2020-07-04 ENCOUNTER — Ambulatory Visit: Payer: 59 | Admitting: Family Medicine

## 2020-07-04 ENCOUNTER — Encounter: Payer: Self-pay | Admitting: Family Medicine

## 2020-07-04 ENCOUNTER — Telehealth (INDEPENDENT_AMBULATORY_CARE_PROVIDER_SITE_OTHER): Payer: Medicare Other | Admitting: Family Medicine

## 2020-07-04 DIAGNOSIS — B182 Chronic viral hepatitis C: Secondary | ICD-10-CM | POA: Diagnosis not present

## 2020-07-04 DIAGNOSIS — E785 Hyperlipidemia, unspecified: Secondary | ICD-10-CM

## 2020-07-04 DIAGNOSIS — N611 Abscess of the breast and nipple: Secondary | ICD-10-CM

## 2020-07-04 MED ORDER — DOXYCYCLINE HYCLATE 100 MG PO TABS: 100.0000 mg | ORAL_TABLET | Freq: Two times a day (BID) | ORAL | 0 refills | Status: DC

## 2020-07-04 NOTE — Progress Notes (Signed)
Virtual Visit via Video   I connected with patient on 07/04/20 at 11:30 AM EST by a video enabled telemedicine application and verified that I am speaking with the correct person using two identifiers.  Location patient: Home Location provider: [facility] Persons participating in the virtual visit: Patient, Provider, [place] (Sabrina M)  I discussed the limitations of evaluation and management by telemedicine and the availability of in person appointments. The patient expressed understanding and agreed to proceed.  Subjective:   HPI:   Hyperlipidemia- chronic problem, on Crestor 69m daily  Hepatitis C- completed treatment in September.  Most recent labs were undetectable.  Following w/ ID  Breast abscess- new.  Pt reports a few months had large lesion on R breast, 'the size of my nipple' and her daughter drained it, 'a whole lot of nasty came out'.  Area has not healed completely and she feels that the area is again turning into a knot.  TTP.  No visible fluid or area to drain.  ROS:   See pertinent positives and negatives per HPI.  Patient Active Problem List   Diagnosis Date Noted  . Primary osteoarthritis of left knee 01/04/2020  . Chronic hepatitis C without hepatic coma ([place] 11/29/2019  . Chronic left-sided low back pain without sciatica 11/23/2019  . Cervical radiculopathy 11/23/2019  . Knee mass, left 11/23/2019  . Pre-diabetes 06/27/2019  . Numbness 07/21/2018  . Status post carpal tunnel release 04/17/2016  . Physical exam 05/03/2015  . OAB (overactive bladder) 11/02/2014  . Post-surgical hypothyroidism 10/10/2014  . Visual loss 10/10/2014  . Hypotension 10/10/2014  . Chronic back pain 09/07/2013  . Paresthesia of hand, bilateral 09/07/2013  . Recurrent falls 01/17/2013  . TOBACCO ABUSE 05/15/2010  . GOITER 05/14/2010  . Hyperlipidemia 05/14/2010  . HYPOCALCEMIA 05/14/2010  . HYPOPARATHYROIDISM 03/14/2010    Social History   Tobacco Use   . Smoking status: Heavy Tobacco Smoker    Packs/day: 0.50    Types: Cigarettes    Start date: 12/16/1978  . Smokeless tobacco: Never Used  . Tobacco comment: e cigs at times  Substance Use Topics  . Alcohol use: Yes    Alcohol/week: 5.0 standard drinks    Types: 2 Glasses of wine, 3 Cans of beer per week    Comment: 4-5  drinks per times a week     Current Outpatient Medications:  .  aspirin 81 MG tablet, Take 81 mg by mouth daily. , Disp: , Rfl:  .  calcitRIOL (ROCALTROL) 0.5 MCG capsule, Take 2 capsules (1 mcg total) by mouth daily., Disp: 60 capsule, Rfl: 11 .  cholecalciferol (VITAMIN D3) 25 MCG (1000 UT) tablet, Take 1,000 Units by mouth daily. , Disp: , Rfl:  .  COVID-19 Specimen Collection KIT, USE 1 KIT TODAY AS DIRECTED, Disp: , Rfl:  .  diclofenac sodium (VOLTAREN) 1 % GEL, Apply 2 g topically 4 (four) times daily., Disp: 1 Tube, Rfl: 5 .  fish oil-omega-3 fatty acids 1000 MG capsule, Take 2 g by mouth daily. , Disp: , Rfl:  .  levothyroxine (EUTHYROX) 100 MCG tablet, Take 1 tablet (100 mcg total) by mouth daily before breakfast., Disp: 90 tablet, Rfl: 1 .  Multiple Vitamin (MULTIVITAMIN) tablet, Take 1 tablet by mouth daily., Disp: , Rfl:  .  rosuvastatin (CRESTOR) 20 MG tablet, Take 1 tablet by mouth once daily, Disp: 30 tablet, Rfl: 0 .  traMADol (ULTRAM) 50 MG tablet, Take 1 tablet (50 mg total) by mouth 2 (  two) times daily as needed., Disp: 30 tablet, Rfl: 0 .  triamcinolone ointment (KENALOG) 0.1 %, Apply 1 application topically 2 (two) times daily., Disp: 90 g, Rfl: 1 .  vitamin B-12 (CYANOCOBALAMIN) 1000 MCG tablet, Take 1,000 mcg by mouth daily., Disp: , Rfl:  .  Glecaprevir-Pibrentasvir (MAVYRET) 100-40 MG TABS, Take 3 tablets by mouth daily with breakfast. (Patient not taking: No sig reported), Disp: 84 tablet, Rfl: 1 .  methocarbamol (ROBAXIN) 500 MG tablet, Take 1 tablet (500 mg total) by mouth at bedtime as needed for muscle spasms. (Patient not taking: Reported on  07/04/2020), Disp: 20 tablet, Rfl: 0 .  predniSONE (DELTASONE) 10 MG tablet, 3 tabs x3 days and then 2 tabs x3 days and then 1 tab x3 days.  Take w/ food. (Patient not taking: Reported on 07/04/2020), Disp: 18 tablet, Rfl: 0 .  predniSONE (STERAPRED UNI-PAK 21 TAB) 10 MG (21) TBPK tablet, Take as directed (Patient not taking: Reported on 07/04/2020), Disp: 21 tablet, Rfl: 0  Allergies  Allergen Reactions  . Penicillins     REACTION: Yeast Infections    Objective:   There were no vitals taken for this visit. AAOx3, NAD NCAT, EOMI No obvious CN deficits Coloring WNL Pt is able to speak clearly, coherently without shortness of breath or increased work of breathing.  Thought process is linear.  Mood is appropriate.   Assessment and Plan:   Hyperlipidemia- chronic problem, tolerating Crestor 23m w/o difficulty.  Check labs.  Adjust meds prn   Hep C- pt completed her tx in September and recently tested negative for Hep C RNA.  Will need continued f/u due to increased risk of liver cancer.  Will follow along.  Breast abscess- new.  Pt's description of previous issue sounds like an abscess that likely wasn't fully resolved w/ drainage.  Will start Doxycycline and refer to surgery for complete evaluation.  Pt expressed understanding and is in agreement w/ plan.    KAnnye Asa MD 07/04/2020

## 2020-07-04 NOTE — Progress Notes (Signed)
I connected with  Tricia Woodward on 07/04/20 by a video enabled telemedicine application and verified that I am speaking with the correct person using two identifiers.   I discussed the limitations of evaluation and management by telemedicine. The patient expressed understanding and agreed to proceed.

## 2020-07-06 NOTE — Addendum Note (Signed)
Addended by: Midge Minium on: 07/06/2020 09:21 AM   Modules accepted: Orders

## 2020-07-11 ENCOUNTER — Encounter: Payer: Self-pay | Admitting: Family Medicine

## 2020-07-15 ENCOUNTER — Encounter: Payer: Self-pay | Admitting: Endocrinology

## 2020-07-23 ENCOUNTER — Other Ambulatory Visit: Payer: Self-pay | Admitting: Family Medicine

## 2020-07-23 DIAGNOSIS — E785 Hyperlipidemia, unspecified: Secondary | ICD-10-CM

## 2020-07-24 ENCOUNTER — Other Ambulatory Visit: Payer: Self-pay | Admitting: Family Medicine

## 2020-07-24 DIAGNOSIS — N611 Abscess of the breast and nipple: Secondary | ICD-10-CM

## 2020-07-26 ENCOUNTER — Ambulatory Visit
Admission: RE | Admit: 2020-07-26 | Discharge: 2020-07-26 | Disposition: A | Discharge status: Home / Self Care | Payer: Medicare Other | Source: Ambulatory Visit | Attending: Family Medicine | Admitting: Family Medicine

## 2020-07-26 ENCOUNTER — Telehealth: Payer: Self-pay | Admitting: Family Medicine

## 2020-07-26 ENCOUNTER — Other Ambulatory Visit: Payer: Self-pay

## 2020-07-26 DIAGNOSIS — N611 Abscess of the breast and nipple: Secondary | ICD-10-CM

## 2020-07-26 DIAGNOSIS — R922 Inconclusive mammogram: Secondary | ICD-10-CM | POA: Diagnosis not present

## 2020-07-26 NOTE — Telephone Encounter (Signed)
Lm asking pt to call back to schedule a lab appt now and a cpe after 01/02/21

## 2020-07-27 ENCOUNTER — Other Ambulatory Visit: Payer: Medicare Other

## 2020-07-30 ENCOUNTER — Other Ambulatory Visit (INDEPENDENT_AMBULATORY_CARE_PROVIDER_SITE_OTHER): Payer: Medicare Other

## 2020-07-30 DIAGNOSIS — E785 Hyperlipidemia, unspecified: Secondary | ICD-10-CM

## 2020-07-30 LAB — HEPATIC FUNCTION PANEL
ALT: 16 U/L (ref 0–35)
AST: 22 U/L (ref 0–37)
Albumin: 4.2 g/dL (ref 3.5–5.2)
Alkaline Phosphatase: 46 U/L (ref 39–117)
Bilirubin, Direct: 0.2 mg/dL (ref 0.0–0.3)
Total Bilirubin: 0.8 mg/dL (ref 0.2–1.2)
Total Protein: 8 g/dL (ref 6.0–8.3)

## 2020-07-30 LAB — BASIC METABOLIC PANEL
BUN: 15 mg/dL (ref 6–23)
CO2: 33 mEq/L — ABNORMAL HIGH (ref 19–32)
Calcium: 8.9 mg/dL (ref 8.4–10.5)
Chloride: 102 mEq/L (ref 96–112)
Creatinine, Ser: 0.9 mg/dL (ref 0.40–1.20)
GFR: 67.11 mL/min (ref >60.00)
Glucose, Bld: 78 mg/dL (ref 70–99)
Potassium: 3.8 mEq/L (ref 3.5–5.1)
Sodium: 142 mEq/L (ref 135–145)

## 2020-07-30 LAB — LIPID PANEL
Cholesterol: 202 mg/dL — ABNORMAL HIGH (ref 0–200)
HDL: 105.8 mg/dL (ref >39.00)
LDL Cholesterol: 82 mg/dL (ref 0–99)
NonHDL: 96.27
Total CHOL/HDL Ratio: 2
Triglycerides: 71 mg/dL (ref 0.0–149.0)
VLDL: 14.2 mg/dL (ref 0.0–40.0)

## 2020-08-03 ENCOUNTER — Other Ambulatory Visit: Payer: Self-pay

## 2020-08-03 DIAGNOSIS — E785 Hyperlipidemia, unspecified: Secondary | ICD-10-CM

## 2020-08-03 MED ORDER — ROSUVASTATIN CALCIUM 20 MG PO TABS: 20.0000 mg | ORAL_TABLET | Freq: Every day | ORAL | 1 refills | Status: DC

## 2020-08-20 ENCOUNTER — Ambulatory Visit: Payer: Self-pay | Admitting: Surgery

## 2020-08-20 DIAGNOSIS — N6011 Diffuse cystic mastopathy of right breast: Secondary | ICD-10-CM | POA: Diagnosis not present

## 2020-08-20 NOTE — H&P (Signed)
Tricia Woodward Appointment: 08/20/2020 1:50 PM Location: [facility] Patient #: [id number] DOB: [birth year] Single / Language: Tricia Woodward / Race: Black or African American Female  History of Present Illness Tricia Woodward Student MD; 08/20/2020 4:22 PM) Patient words: 65 year old female presents for chronic right breast abscess. This has been managed since November 2021 with aspiration antibiotics has not resolved with multiple recurrences. She presents today to discuss surgical options. She is a smoker. Her pain is in the right nipple with mass lesion and drainage from time to time. She's had aspirations but the area will not resolve. No issues with her left breast today. She is due for her mammogram.                65 year old female with history of abscess involving the right nipple/areolar complex. The patient states this has been present since November 2021. The patient and her daughter had previously placed needles in the abscess in order to express purulent discharge when this first occurred in November/December. This was openly draining and improved on its own, however recently recurred. She was recently evaluated by her provider and was placed on a course of antibiotics which she stopped approximately 1 week prior. She states pain which she was previously experiencing has drastically improved however she still has a persistent abscess/palpable abnormality. No recent drainage.  EXAM: DIGITAL DIAGNOSTIC UNILATERAL RIGHT MAMMOGRAM WITH TOMOSYNTHESIS AND CAD; ULTRASOUND RIGHT BREAST LIMITED  TECHNIQUE: Right digital diagnostic mammography and breast tomosynthesis was performed. The images were evaluated with computer-aided detection.; Targeted ultrasound examination of the right breast was performed.  COMPARISON: Previous exams.  ACR Breast Density Category c: The breast tissue is heterogeneously dense, which may obscure small masses.  FINDINGS: Cc and  MLO tomograms of the right breast were performed. There is increased density involving the immediate retroareolar right breast corresponding to the palpable marker.  Physical examination reveals firm erythematous masslike area involving the nipple as well on the areola immediate adjacent to the nipple with a slightly raised yellow area/comedone directly on the nipple. No discharge identified. Patient is mildly tender to palpation.  Targeted ultrasound of the right breast was performed. There is a thick/heterogeneous collection with surrounding increased vascularity in the right breast at 7 o'clock in the skin of the areola measuring 1.4 x 1.1 x 1.7 cm. No suspicious masses or abnormality seen in the underlying breast.  IMPRESSION: Small right areola abscess measuring up to 1.7 cm. No findings of malignancy in the right breast.  RECOMMENDATION: 1. Consider an additional course of antibiotics given persistent/chronic abscess and recommend follow-up diagnostic mammography of the right breast in approximately 3 months to ensure resolution of the mammography findings.  2. Patient states she would like to see a surgeon for definitive treatment/possible excision of this chronic right areola abscess rather than treatment/continued follow-up at the breast Center. This referral will be made by the breast center nurse navigator.  I have discussed the findings and recommendations with the patient. If applicable, a reminder letter will be sent to the patient regarding the next appointment.  BI-RADS CATEGORY 3: Probably benign.  Electronically Signed: By: Tricia Woodward M.D. On: 07/26/2020 15:40.  The patient is a 65 year old female.   Past Surgical History Tricia Woodward, CMA; 08/20/2020 1:57 PM) Breast Mass; Local Excision Bilateral. Foot Surgery Right. Hysterectomy (not due to cancer) - Partial Oral Surgery Thyroid Surgery  Diagnostic Studies History Tricia Lull R.  Woodward, CMA; 08/20/2020 1:57 PM) Colonoscopy 1-5 years ago Mammogram 1-3  years ago Pap Smear >5 years ago  Allergies Tricia Woodward, CMA; 08/20/2020 1:57 PM) Penicillin G Potassium *PENICILLINS*  Medication History (Michelle R. Woodward, CMA; 08/20/2020 1:59 PM) Calcitriol (0.5MCG Capsule, Oral) Active. Euthyrox (100MCG Tablet, Oral) Active. Rosuvastatin Calcium (20MG  Tablet, Oral) Active. Vitamin B-12 (Oral) Specific strength unknown - Active. Fish Oil (Oral) Specific strength unknown - Active. Vitamin D2 (Oral) Specific strength unknown - Active. Aspirin (81MG  Tablet, Oral) Active. Medications Reconciled  Social History Tricia Woodward, CMA; 08/20/2020 1:57 PM) Alcohol use Moderate alcohol use. No drug use Tobacco use Current every day smoker.  Family History Tricia Lull R. Rolena Infante, CMA; 08/20/2020 1:57 PM) Family history unknown First Degree Relatives  Pregnancy / Birth History Tricia Lull R. Rolena Infante, CMA; 08/20/2020 1:57 PM) Age at menarche 44 years. Age of menopause 40-55 Gravida 2 Irregular periods Maternal age 43-20 Para 2  Other Problems Tricia Woodward, CMA; 08/20/2020 1:57 PM) Arthritis Back Pain Heart murmur Hemorrhoids Hepatitis Hypercholesterolemia Thyroid Disease     Review of Systems [facility] R. Woodward CMA; 08/20/2020 1:57 PM) General Present- Fatigue. Not Present- Appetite Loss, Chills, Fever, Night Sweats, Weight Gain and Weight Loss. Skin Present- Dryness. Not Present- Change in Wart/Mole, Hives, Jaundice, New Lesions, Non-Healing Wounds, Rash and Ulcer. HEENT Present- Hearing Loss, Seasonal Allergies and Wears glasses/contact lenses. Not Present- Earache, Hoarseness, Nose Bleed, Oral Ulcers, Ringing in the Ears, Sinus Pain, Sore Throat, Visual Disturbances and Yellow Eyes. Respiratory Not Present- Bloody sputum, Chronic Cough, Difficulty Breathing, Snoring and Wheezing. Breast Present- Breast Mass. Not Present- Breast Pain,  Nipple Discharge and Skin Changes. Cardiovascular Not Present- Chest Pain, Difficulty Breathing Lying Down, Leg Cramps, Palpitations, Rapid Heart Rate, Shortness of Breath and Swelling of Extremities. Gastrointestinal Not Present- Abdominal Pain, Bloating, Bloody Stool, Change in Bowel Habits, Chronic diarrhea, Constipation, Difficulty Swallowing, Excessive gas, Gets full quickly at meals, Hemorrhoids, Indigestion, Nausea, Rectal Pain and Vomiting. Female Genitourinary Present- Frequency and Nocturia. Not Present- Painful Urination, Pelvic Pain and Urgency. Musculoskeletal Present- Back Pain, Joint Pain, Joint Stiffness and Muscle Pain. Not Present- Muscle Weakness and Swelling of Extremities. Neurological Not Present- Decreased Memory, Fainting, Headaches, Numbness, Seizures, Tingling, Tremor, Trouble walking and Weakness. Psychiatric Not Present- Anxiety, Bipolar, Change in Sleep Pattern, Depression, Fearful and Frequent crying. Endocrine Present- Cold Intolerance. Not Present- Excessive Hunger, Hair Changes, Heat Intolerance, Hot flashes and New Diabetes. Hematology Present- Gland problems. Not Present- Blood Thinners, Easy Bruising, Excessive bleeding, HIV and Persistent Infections.  Vitals [employer] R. Woodward CMA; 08/20/2020 1:57 PM) 08/20/2020 1:57 PM Weight: 137.13 lb Height: 68.5in Body Surface Area: 1.75 m Body Mass Index: 20.55 kg/m  Pulse: 109 (Regular)  BP: 90/64(Sitting, Left Arm, Standard)        Physical Exam (Adanya Sosinski A. Chrystian Ressler MD; 08/20/2020 4:22 PM)  General Mental Status-Alert. General Appearance-Consistent with stated age. Hydration-Well hydrated. Voice-Normal.  Head and Neck Head-normocephalic, atraumatic with no lesions or palpable masses. Trachea-midline. Thyroid Gland Characteristics - normal size and consistency.  Chest and Lung Exam Chest and lung exam reveals -quiet, even and easy respiratory effort with no use of accessory muscles  and on auscultation, normal breath sounds, no adventitious sounds and normal vocal resonance. Inspection Chest Wall - Normal. Back - normal.  Breast Note: Right breast has a small area of swelling which appears chronic underneath the nipple. This measures 2 cm. No other masses noted in the right breast. Left breast is otherwise normal.  Cardiovascular Cardiovascular examination reveals -normal heart sounds, regular rate and rhythm with no murmurs and normal  pedal pulses bilaterally.  Neurologic Neurologic evaluation reveals -alert and oriented x 3 with no impairment of recent or remote memory. Mental Status-Normal.  Lymphatic Head & Neck  General Head & Neck Lymphatics: Bilateral - Description - Normal. Axillary  General Axillary Region: Bilateral - Description - Normal. Tenderness - Non Tender.    Assessment & Plan (Dekota Kirlin A. Amish Mintzer MD; 08/20/2020 4:23 PM)  CHRONIC MASTITIS OF RIGHT BREAST (N60.11) Impression: CENTRAL DUCT EXCISION SINCE IT HAS NOT IMPROVED WITH DRAINAGE AND ANTIBIOTICS  Patient's failed medical management and aspiration. Recommend excision to hopefully prevent recurrences. She does smoke, I told her she is a high-risk of recurrence if she continues to smoke in either breast. I splayed the surgery she smokes this can recur. I strongly encouraged her to quit to prevent recurrences. Risk of lumpectomy include bleeding, infection, seroma, more surgery, use of seed/wire, wound care, cosmetic deformity and the need for other treatments, death , blood clots, death. Pt agrees to proceed.  Current Plans Pt Education - [facility] Breast Biopsy HCI: discussed with patient and provided information. You are being scheduled for surgery- Our schedulers will call you.  You should hear from our office's scheduling department within 5 working days about the location, date, and time of surgery. We try to make accommodations for patient's preferences in scheduling surgery, but  sometimes the OR schedule or the surgeon's schedule prevents [place] from making those accommodations.  If you have not heard from our office [phone]) in 5 working days, call the office and ask for your surgeon's nurse.  If you have other questions about your diagnosis, plan, or surgery, call the office and ask for your surgeon's nurse.

## 2020-08-23 ENCOUNTER — Other Ambulatory Visit: Payer: Self-pay

## 2020-08-27 ENCOUNTER — Other Ambulatory Visit: Payer: Self-pay

## 2020-08-27 ENCOUNTER — Ambulatory Visit (INDEPENDENT_AMBULATORY_CARE_PROVIDER_SITE_OTHER): Payer: Medicare Other | Admitting: Endocrinology

## 2020-08-27 VITALS — BP 122/68 | HR 101 | Ht 68.5 in | Wt 136.0 lb

## 2020-08-27 DIAGNOSIS — E538 Deficiency of other specified B group vitamins: Secondary | ICD-10-CM

## 2020-08-27 DIAGNOSIS — E209 Hypoparathyroidism, unspecified: Secondary | ICD-10-CM | POA: Diagnosis not present

## 2020-08-27 DIAGNOSIS — E890 Postprocedural hypothyroidism: Secondary | ICD-10-CM | POA: Diagnosis not present

## 2020-08-27 LAB — BASIC METABOLIC PANEL
BUN: 16 mg/dL (ref 6–23)
CO2: 25 mEq/L (ref 19–32)
Calcium: 8.5 mg/dL (ref 8.4–10.5)
Chloride: 102 mEq/L (ref 96–112)
Creatinine, Ser: 0.84 mg/dL (ref 0.40–1.20)
GFR: 72.86 mL/min (ref >60.00)
Glucose, Bld: 111 mg/dL — ABNORMAL HIGH (ref 70–99)
Potassium: 3.6 mEq/L (ref 3.5–5.1)
Sodium: 136 mEq/L (ref 135–145)

## 2020-08-27 LAB — VITAMIN B12: Vitamin B-12: 1252 pg/mL — ABNORMAL HIGH (ref 211–911)

## 2020-08-27 LAB — VITAMIN D 25 HYDROXY (VIT D DEFICIENCY, FRACTURES): VITD: 57.79 ng/mL (ref 30.00–100.00)

## 2020-08-27 LAB — T4, FREE: Free T4: 1.2 ng/dL (ref 0.60–1.60)

## 2020-08-27 LAB — TSH: TSH: 2.1 u[IU]/mL (ref 0.35–4.50)

## 2020-08-27 NOTE — Patient Instructions (Signed)
It is expected that "Natpara" will be available again later this year.   Blood tests are requested for you today.  We'll let you know about the results.  Please come back for a follow-up appointment in 6 months.

## 2020-08-27 NOTE — Progress Notes (Signed)
Subjective:    Patient ID: Tricia Woodward, female    DOB: [birth year], 65 y.o.   MRN: [id number]  HPI Pt returns for f/u of postsurgical hypoparathyroidism (she had a thyroidectomy in the 1970's, in [place], for suspicious nodules, which proved on pathol to be benign; since then she has had hypoparathyroidism; in mid-2015, she was found to have low Vitamin-D, and was rx'ed ergocalciferol; she also took forteo samples for a few mos in 2015, but had to d/c as it was declined by insurance; she is on chronic rocaltrol; [name] was added in Nov of 2017, but was later recalled).  She reports muscle stiffness and acral numbness.    She takes B-12 tabs as rx'ed.   Past Medical History:  Diagnosis Date  . Allergy   . Arthritis   . Frequent falls   . Heart murmur   . Hemorrhoids    past hx   . Low blood sugar   . Thyroid disease     Past Surgical History:  Procedure Laterality Date  . ABDOMINAL HYSTERECTOMY     partial  . BREAST CYST EXCISION Bilateral 1983  . BUNIONECTOMY    . CARPAL TUNNEL RELEASE Left 2017  . DENTAL SURGERY    . THYROID SURGERY Bilateral 1970    Social History   Socioeconomic History  . Marital status: Single    Spouse name: Not on file  . Number of children: Not on file  . Years of education: Not on file  . Highest education level: Not on file  Occupational History  . Not on file  Tobacco Use  . Smoking status: Heavy Tobacco Smoker    Packs/day: 0.50    Types: Cigarettes    Start date: 12/16/1978  . Smokeless tobacco: Never Used  . Tobacco comment: e cigs at times  Vaping Use  . Vaping Use: Former  Substance and Sexual Activity  . Alcohol use: Yes    Alcohol/week: 5.0 standard drinks    Types: 2 Glasses of wine, 3 Cans of beer per week    Comment: 4-5  drinks per times a week   . Drug use: No  . Sexual activity: Not Currently  Other Topics Concern  . Not on file  Social History Narrative  . Not on file   Social Determinants of Health   Financial  Resource Strain: Not on file  Food Insecurity: Not on file  Transportation Needs: Not on file  Physical Activity: Not on file  Stress: Not on file  Social Connections: Not on file  Intimate Partner Violence: Not on file    Current Outpatient Medications on File Prior to Visit  Medication Sig Dispense Refill  . aspirin 81 MG tablet Take 81 mg by mouth daily.     . calcitRIOL (ROCALTROL) 0.5 MCG capsule Take 2 capsules (1 mcg total) by mouth daily. 60 capsule 11  . cholecalciferol (VITAMIN D3) 25 MCG (1000 UT) tablet Take 1,000 Units by mouth daily.     Marland Kitchen COVID-19 Specimen Collection KIT USE 1 KIT TODAY AS DIRECTED    . diclofenac sodium (VOLTAREN) 1 % GEL Apply 2 g topically 4 (four) times daily. 1 Tube 5  . EUTHYROX 100 MCG tablet TAKE 1 TABLET BY MOUTH ONCE DAILY BEFORE BREAKFAST 90 tablet 0  . fish oil-omega-3 fatty acids 1000 MG capsule Take 2 g by mouth daily.     . Glecaprevir-Pibrentasvir (MAVYRET) 100-40 MG TABS Take 3 tablets by mouth daily with breakfast. 84 tablet 1  .  methocarbamol (ROBAXIN) 500 MG tablet Take 1 tablet (500 mg total) by mouth at bedtime as needed for muscle spasms. 20 tablet 0  . Multiple Vitamin (MULTIVITAMIN) tablet Take 1 tablet by mouth daily.    . rosuvastatin (CRESTOR) 20 MG tablet Take 1 tablet (20 mg total) by mouth daily. 90 tablet 1  . traMADol (ULTRAM) 50 MG tablet Take 1 tablet (50 mg total) by mouth 2 (two) times daily as needed. 30 tablet 0  . triamcinolone ointment (KENALOG) 0.1 % Apply 1 application topically 2 (two) times daily. 90 g 1  . vitamin B-12 (CYANOCOBALAMIN) 1000 MCG tablet Take 1,000 mcg by mouth daily.     No current facility-administered medications on file prior to visit.    Allergies  Allergen Reactions  . Penicillins     REACTION: Yeast Infections    Family History  Problem Relation Age of Onset  . Hypertension Father   . Hypertension Sister   . Cirrhosis Mother   . Arthritis Other   . Hyperlipidemia Other   .  Aneurysm Other   . Colon cancer Neg Hx   . Esophageal cancer Neg Hx   . Rectal cancer Neg Hx   . Stomach cancer Neg Hx   . Breast cancer Neg Hx     BP 122/68 (BP Location: Right Arm, Patient Position: Sitting, Cuff Size: Normal)   Pulse (!) 101   Ht 5' 8.5" (1.74 m)   Wt 136 lb (61.7 kg)   SpO2 95%   BMI 20.38 kg/m    Review of Systems Denies cramps.      Objective:   Physical Exam VITAL SIGNS:  See vs page GENERAL: no distress Neck: a healed scar is present.  I do not appreciate a nodule in the thyroid or elsewhere in the neck.    Lab Results  Component Value Date   PTH 3 (L) 03/28/2020   CALCIUM 8.5 08/27/2020   CAION 1.02 (L) 02/04/2019   PHOS 4.6 03/28/2020       Assessment & Plan:  Hypoparathyroidism: well-controlled.  Please continue the same Rocaltrol.   B-12 def: recheck today.

## 2020-08-28 ENCOUNTER — Encounter: Payer: Self-pay | Admitting: Internal Medicine

## 2020-08-29 ENCOUNTER — Encounter: Payer: Self-pay | Admitting: Endocrinology

## 2020-08-31 DIAGNOSIS — Z01818 Encounter for other preprocedural examination: Secondary | ICD-10-CM | POA: Diagnosis not present

## 2020-09-04 ENCOUNTER — Other Ambulatory Visit: Payer: Self-pay | Admitting: Surgery

## 2020-09-04 DIAGNOSIS — N611 Abscess of the breast and nipple: Secondary | ICD-10-CM | POA: Diagnosis not present

## 2020-09-04 DIAGNOSIS — N6011 Diffuse cystic mastopathy of right breast: Secondary | ICD-10-CM | POA: Diagnosis not present

## 2020-09-25 ENCOUNTER — Emergency Department (HOSPITAL_BASED_OUTPATIENT_CLINIC_OR_DEPARTMENT_OTHER)
Admission: EM | Admit: 2020-09-25 | Discharge: 2020-09-25 | Disposition: A | Discharge status: Home / Self Care | Payer: Medicare Other | Attending: Emergency Medicine | Admitting: Emergency Medicine

## 2020-09-25 ENCOUNTER — Encounter (HOSPITAL_BASED_OUTPATIENT_CLINIC_OR_DEPARTMENT_OTHER): Payer: Self-pay | Admitting: Emergency Medicine

## 2020-09-25 ENCOUNTER — Other Ambulatory Visit: Payer: Self-pay

## 2020-09-25 DIAGNOSIS — Z23 Encounter for immunization: Secondary | ICD-10-CM | POA: Insufficient documentation

## 2020-09-25 DIAGNOSIS — F17210 Nicotine dependence, cigarettes, uncomplicated: Secondary | ICD-10-CM | POA: Diagnosis not present

## 2020-09-25 DIAGNOSIS — S51812A Laceration without foreign body of left forearm, initial encounter: Secondary | ICD-10-CM | POA: Insufficient documentation

## 2020-09-25 DIAGNOSIS — Z7982 Long term (current) use of aspirin: Secondary | ICD-10-CM | POA: Diagnosis not present

## 2020-09-25 DIAGNOSIS — R202 Paresthesia of skin: Secondary | ICD-10-CM | POA: Insufficient documentation

## 2020-09-25 DIAGNOSIS — S59912A Unspecified injury of left forearm, initial encounter: Secondary | ICD-10-CM | POA: Diagnosis present

## 2020-09-25 DIAGNOSIS — W19XXXA Unspecified fall, initial encounter: Secondary | ICD-10-CM

## 2020-09-25 DIAGNOSIS — W01118A Fall on same level from slipping, tripping and stumbling with subsequent striking against other sharp object, initial encounter: Secondary | ICD-10-CM | POA: Insufficient documentation

## 2020-09-25 DIAGNOSIS — S41112A Laceration without foreign body of left upper arm, initial encounter: Secondary | ICD-10-CM

## 2020-09-25 DIAGNOSIS — Y9301 Activity, walking, marching and hiking: Secondary | ICD-10-CM | POA: Insufficient documentation

## 2020-09-25 MED ORDER — LIDOCAINE-EPINEPHRINE (PF) 2 %-1:200000 IJ SOLN
10.0000 mL | Freq: Once | INTRAMUSCULAR | Status: AC
  Administered 2020-09-25: 10 mL
  Filled 2020-09-25: qty 20

## 2020-09-25 MED ORDER — TETANUS-DIPHTH-ACELL PERTUSSIS 5-2.5-18.5 LF-MCG/0.5 IM SUSY
0.5000 mL | PREFILLED_SYRINGE | Freq: Once | INTRAMUSCULAR | Status: AC
  Administered 2020-09-25: 0.5 mL via INTRAMUSCULAR
  Filled 2020-09-25: qty 0.5

## 2020-09-25 NOTE — Discharge Instructions (Addendum)
You were seen in the emergency department for evaluation of injuries from a fall.  You had a laceration to your left forearm that was repaired with sutures.  These will need to removed in 10 to 12 days.  Keep clean with soap and water.  Return sooner if any signs of infection.

## 2020-09-25 NOTE — ED Provider Notes (Signed)
[facility] EMERGENCY DEPT Provider Note   CSN: [id number] Arrival date & time: 09/25/20  1600     History Chief Complaint  Patient presents with  . Laceration    Tricia Woodward is a 65 y.o. female.  She is here with a complaint of a laceration to her left forearm.  She said she was walking with a tape dispenser and tripped falling to her elbows and knees.  Did not strike her head or loss consciousness.  She thinks she might of cut her arm on the blade of the tape dispenser.  No numbness or weakness in that arm.  She does notice some tingling in her right hand that happened before the fall.  But she does admit she had been painting for the last few days and that is unusual activity for her.  No head or neck pain no back pain.  Not on any blood thinners  The history is provided by the patient.  Laceration Location:  Shoulder/arm Shoulder/arm laceration location:  L forearm Length:  5 Depth:  Through dermis Quality: straight   Bleeding: controlled   Laceration mechanism:  Metal edge Pain details:    Severity:  No pain Foreign body present:  No foreign bodies Relieved by:  Pressure Worsened by:  Nothing Ineffective treatments:  None tried Tetanus status:  Out of date Associated symptoms: no fever, no focal weakness and no swelling        Past Medical History:  Diagnosis Date  . Allergy   . Arthritis   . Frequent falls   . Heart murmur   . Hemorrhoids    past hx   . Low blood sugar   . Thyroid disease     Patient Active Problem List   Diagnosis Date Noted  . B12 deficiency 08/27/2020  . Primary osteoarthritis of left knee 01/04/2020  . Chronic hepatitis C without hepatic coma ([place]) 11/29/2019  . Chronic left-sided low back pain without sciatica 11/23/2019  . Cervical radiculopathy 11/23/2019  . Knee mass, left 11/23/2019  . Pre-diabetes 06/27/2019  . Numbness 07/21/2018  . Status post carpal tunnel release 04/17/2016  . Physical exam 05/03/2015   . OAB (overactive bladder) 11/02/2014  . Post-surgical hypothyroidism 10/10/2014  . Visual loss 10/10/2014  . Hypotension 10/10/2014  . Chronic back pain 09/07/2013  . Paresthesia of hand, bilateral 09/07/2013  . Recurrent falls 01/17/2013  . TOBACCO ABUSE 05/15/2010  . GOITER 05/14/2010  . Hyperlipidemia 05/14/2010  . HYPOCALCEMIA 05/14/2010  . HYPOPARATHYROIDISM 03/14/2010    Past Surgical History:  Procedure Laterality Date  . ABDOMINAL HYSTERECTOMY     partial  . BREAST CYST EXCISION Bilateral 1983  . BUNIONECTOMY    . CARPAL TUNNEL RELEASE Left 2017  . DENTAL SURGERY    . THYROID SURGERY Bilateral 1970     OB History   No obstetric history on file.     Family History  Problem Relation Age of Onset  . Hypertension Father   . Hypertension Sister   . Cirrhosis Mother   . Arthritis Other   . Hyperlipidemia Other   . Aneurysm Other   . Colon cancer Neg Hx   . Esophageal cancer Neg Hx   . Rectal cancer Neg Hx   . Stomach cancer Neg Hx   . Breast cancer Neg Hx     Social History   Tobacco Use  . Smoking status: Heavy Tobacco Smoker    Packs/day: 0.50    Types: Cigarettes    Start  date: 12/16/1978  . Smokeless tobacco: Never Used  . Tobacco comment: e cigs at times  Vaping Use  . Vaping Use: Former  Substance Use Topics  . Alcohol use: Yes    Alcohol/week: 5.0 standard drinks    Types: 2 Glasses of wine, 3 Cans of beer per week    Comment: 4-5  drinks per times a week   . Drug use: No    Home Medications Prior to Admission medications   Medication Sig Start Date End Date Taking? Authorizing Provider  aspirin 81 MG tablet Take 81 mg by mouth daily.     [provider]  calcitRIOL (ROCALTROL) 0.5 MCG capsule Take 2 capsules (1 mcg total) by mouth daily. 03/29/20   Renato Shin, MD  cholecalciferol (VITAMIN D3) 25 MCG (1000 UT) tablet Take 1,000 Units by mouth daily.     [provider]  COVID-19 Specimen Collection KIT USE 1 KIT  TODAY AS DIRECTED 04/09/20   [provider]  diclofenac sodium (VOLTAREN) 1 % GEL Apply 2 g topically 4 (four) times daily. 03/04/18   Midge Minium, MD  EUTHYROX 100 MCG tablet TAKE 1 TABLET BY MOUTH ONCE DAILY BEFORE BREAKFAST 07/23/20   Midge Minium, MD  fish oil-omega-3 fatty acids 1000 MG capsule Take 2 g by mouth daily.     [provider]  Glecaprevir-Pibrentasvir (MAVYRET) 100-40 MG TABS Take 3 tablets by mouth daily with breakfast. 12/21/19   Kuppelweiser, Cassie L, RPH-CPP  methocarbamol (ROBAXIN) 500 MG tablet Take 1 tablet (500 mg total) by mouth at bedtime as needed for muscle spasms. 11/04/19   Midge Minium, MD  Multiple Vitamin (MULTIVITAMIN) tablet Take 1 tablet by mouth daily.    [provider]  rosuvastatin (CRESTOR) 20 MG tablet Take 1 tablet (20 mg total) by mouth daily. 08/03/20   Midge Minium, MD  traMADol (ULTRAM) 50 MG tablet Take 1 tablet (50 mg total) by mouth 2 (two) times daily as needed. 01/23/20   Midge Minium, MD  triamcinolone ointment (KENALOG) 0.1 % Apply 1 application topically 2 (two) times daily. 01/23/20 01/22/21  Midge Minium, MD  vitamin B-12 (CYANOCOBALAMIN) 1000 MCG tablet Take 1,000 mcg by mouth daily. 12/16/11   [provider]    Allergies    Penicillins  Review of Systems   Review of Systems  Constitutional: Negative for fever.  Musculoskeletal: Negative for back pain and neck pain.  Skin: Positive for wound.  Neurological: Positive for numbness (tingling right hand). Negative for focal weakness and weakness.    Physical Exam Updated Vital Signs BP 128/81 (BP Location: Right Arm)   Pulse 88   Temp 98.8 F (37.1 C) (Oral)   Resp 17   Ht _0  (1.727 m)   Wt 63.5 kg   SpO2 98%   BMI 21.29 kg/m   Physical Exam Constitutional:      Appearance: Normal appearance. She is well-developed.  HENT:     Head: Normocephalic and atraumatic.  Eyes:     Conjunctiva/sclera:  Conjunctivae normal.  Musculoskeletal:        General: Signs of injury present. No deformity. Normal range of motion.     Cervical back: Neck supple.     Comments: 5 cm laceration dorsum of distal left forearm.  No active bleeding.  No exposed tendon  Skin:    General: Skin is warm and dry.  Neurological:     General: No focal deficit present.  Mental Status: She is alert.     GCS: GCS eye subscore is 4. GCS verbal subscore is 5. GCS motor subscore is 6.     Motor: No weakness.     Gait: Gait normal.     ED Results / Procedures / Treatments   Labs (all labs ordered are listed, but only abnormal results are displayed) Labs Reviewed - No data to display  EKG None  Radiology No results found.  Procedures .Marland KitchenLaceration Repair  Date/Time: 09/25/2020 4:38 PM Performed by: Hayden Rasmussen, MD Authorized by: Hayden Rasmussen, MD   Consent:    Consent obtained:  Verbal   Consent given by:  Patient   Risks discussed:  Infection, pain, poor cosmetic result, poor wound healing and retained foreign body   Alternatives discussed:  No treatment and delayed treatment Universal protocol:    Procedure explained and questions answered to patient or proxy's satisfaction: yes     Patient identity confirmed:  Verbally with patient Anesthesia:    Anesthesia method:  Local infiltration   Local anesthetic:  Lidocaine 2% WITH epi Laceration details:    Location:  Shoulder/arm   Shoulder/arm location:  L lower arm   Length (cm):  6 Pre-procedure details:    Preparation:  Patient was prepped and draped in usual sterile fashion Treatment:    Area cleansed with:  Saline   Amount of cleaning:  Standard   Irrigation solution:  Sterile saline   Debridement:  None Skin repair:    Repair method:  Sutures   Suture size:  6-0   Suture material:  Nylon   Suture technique:  Simple interrupted   Number of sutures:  6 Approximation:    Approximation:  Close Repair type:    Repair type:   Simple Post-procedure details:    Dressing:  Sterile dressing   Procedure completion:  Tolerated well, no immediate complications     Medications Ordered in ED Medications  Tdap (BOOSTRIX) injection 0.5 mL (has no administration in time range)  lidocaine-EPINEPHrine (XYLOCAINE W/EPI) 2 %-1:200000 (PF) injection 10 mL (has no administration in time range)    ED Course  I have reviewed the triage vital signs and the nursing notes.  Pertinent labs & imaging results that were available during my care of the patient were reviewed by me and considered in my medical decision making (see chart for details).    MDM Rules/Calculators/A&P                           Final Clinical Impression(s) / ED Diagnoses Final diagnoses:  Fall, initial encounter  Arm laceration, left, initial encounter    Rx / DC Orders ED Discharge Orders    None       Hayden Rasmussen, MD 09/26/20 1005

## 2020-09-25 NOTE — ED Triage Notes (Signed)
Pt stated she tripped over a small step fell forward onto her elbows and knees. Pt did not hit head. And denies LOC. Pt thinks she cut her left arm on the blade of a large tape dispenser that she had been carrying before she fell.

## 2020-09-25 NOTE — ED Notes (Signed)
Pt laceration sutured by the edp and nonadherent and bacitracin dressing applied with coban wrap

## 2020-09-26 ENCOUNTER — Ambulatory Visit: Payer: Medicare Other | Admitting: Endocrinology

## 2020-10-01 ENCOUNTER — Other Ambulatory Visit: Payer: Self-pay | Admitting: Family Medicine

## 2020-10-01 NOTE — Telephone Encounter (Signed)
Tramadol LFD 01/23/20 # 30with no refills LOV 07/04/20 NOV 01/04/21

## 2020-10-16 ENCOUNTER — Other Ambulatory Visit: Payer: Self-pay | Admitting: Family Medicine

## 2020-11-02 ENCOUNTER — Emergency Department (HOSPITAL_BASED_OUTPATIENT_CLINIC_OR_DEPARTMENT_OTHER)
Admission: EM | Admit: 2020-11-02 | Discharge: 2020-11-02 | Disposition: A | Discharge status: Home / Self Care | Payer: Medicare Other | Attending: Emergency Medicine | Admitting: Emergency Medicine

## 2020-11-02 ENCOUNTER — Encounter (HOSPITAL_BASED_OUTPATIENT_CLINIC_OR_DEPARTMENT_OTHER): Payer: Self-pay | Admitting: Emergency Medicine

## 2020-11-02 ENCOUNTER — Other Ambulatory Visit: Payer: Self-pay

## 2020-11-02 DIAGNOSIS — J312 Chronic pharyngitis: Secondary | ICD-10-CM

## 2020-11-02 DIAGNOSIS — J029 Acute pharyngitis, unspecified: Secondary | ICD-10-CM | POA: Diagnosis not present

## 2020-11-02 DIAGNOSIS — Z7982 Long term (current) use of aspirin: Secondary | ICD-10-CM | POA: Insufficient documentation

## 2020-11-02 DIAGNOSIS — F17210 Nicotine dependence, cigarettes, uncomplicated: Secondary | ICD-10-CM | POA: Insufficient documentation

## 2020-11-02 DIAGNOSIS — G8929 Other chronic pain: Secondary | ICD-10-CM | POA: Insufficient documentation

## 2020-11-02 LAB — GROUP A STREP BY PCR: Group A Strep by PCR: NOT DETECTED — AB

## 2020-11-02 MED ORDER — NAPROXEN 500 MG PO TABS: 500.0000 mg | ORAL_TABLET | Freq: Two times a day (BID) | ORAL | 0 refills | Status: DC

## 2020-11-02 NOTE — ED Triage Notes (Signed)
Recurrent sore throat x 2 weeks. Worse at night. Denies fever.

## 2020-11-02 NOTE — ED Provider Notes (Signed)
[facility] Provider Note  CSN: [id number] Arrival date & time: 11/02/20 1356    History Chief Complaint  Patient presents with  . Sore Throat    HPI  Tricia Woodward is a 65 y.o. female with history of allergies and childhood thyroid disease requiring resection reports she has had a month of worsening sore throat, initially only at night but now hurting all the time, mostly on the left side and worse with swallowing. Associated with a horse voice. She has had similar in the past resolved with Rx for antibiotics from her dentist. She saw him recently be was told there is no longer any teeth in the back that could be infected. She has been taking OTC meds with minimal relief. She called her PCP and was directed to the ED.    Past Medical History:  Diagnosis Date  . Allergy   . Arthritis   . Frequent falls   . Heart murmur   . Hemorrhoids    past hx   . Low blood sugar   . Thyroid disease     Past Surgical History:  Procedure Laterality Date  . ABDOMINAL HYSTERECTOMY     partial  . BREAST CYST EXCISION Bilateral 1983  . BUNIONECTOMY    . CARPAL TUNNEL RELEASE Left 2017  . DENTAL SURGERY    . THYROID SURGERY Bilateral 1970    Family History  Problem Relation Age of Onset  . Hypertension Father   . Hypertension Sister   . Cirrhosis Mother   . Arthritis Other   . Hyperlipidemia Other   . Aneurysm Other   . Colon cancer Neg Hx   . Esophageal cancer Neg Hx   . Rectal cancer Neg Hx   . Stomach cancer Neg Hx   . Breast cancer Neg Hx     Social History   Tobacco Use  . Smoking status: Heavy Tobacco Smoker    Packs/day: 0.50    Types: Cigarettes    Start date: 12/16/1978  . Smokeless tobacco: Never Used  . Tobacco comment: e cigs at times  Vaping Use  . Vaping Use: Former  Substance Use Topics  . Alcohol use: Yes    Alcohol/week: 5.0 standard drinks    Types: 2 Glasses of wine, 3 Cans of beer per week    Comment: 4-5  drinks  per times a week   . Drug use: No     Home Medications Prior to Admission medications   Medication Sig Start Date End Date Taking? Authorizing Provider  naproxen (NAPROSYN) 500 MG tablet Take 1 tablet (500 mg total) by mouth 2 (two) times daily. 11/02/20  Yes Truddie Hidden, MD  aspirin 81 MG tablet Take 81 mg by mouth daily.     [provider]  calcitRIOL (ROCALTROL) 0.5 MCG capsule Take 2 capsules (1 mcg total) by mouth daily. 03/29/20   Renato Shin, MD  cholecalciferol (VITAMIN D3) 25 MCG (1000 UT) tablet Take 1,000 Units by mouth daily.     [provider]  COVID-19 Specimen Collection KIT USE 1 KIT TODAY AS DIRECTED 04/09/20   [provider]  diclofenac sodium (VOLTAREN) 1 % GEL Apply 2 g topically 4 (four) times daily. 03/04/18   Midge Minium, MD  EUTHYROX 100 MCG tablet TAKE 1 TABLET BY MOUTH ONCE DAILY BEFORE BREAKFAST 10/16/20   Midge Minium, MD  fish oil-omega-3 fatty acids 1000 MG capsule Take 2 g by mouth daily.  [provider]  Glecaprevir-Pibrentasvir (MAVYRET) 100-40 MG TABS Take 3 tablets by mouth daily with breakfast. 12/21/19   Kuppelweiser, Cassie L, RPH-CPP  methocarbamol (ROBAXIN) 500 MG tablet Take 1 tablet (500 mg total) by mouth at bedtime as needed for muscle spasms. 11/04/19   Midge Minium, MD  Multiple Vitamin (MULTIVITAMIN) tablet Take 1 tablet by mouth daily.    [provider]  rosuvastatin (CRESTOR) 20 MG tablet Take 1 tablet (20 mg total) by mouth daily. 08/03/20   Midge Minium, MD  traMADol Veatrice Bourbon) 50 MG tablet Take 1 tablet by mouth twice daily as needed 10/03/20   Midge Minium, MD  triamcinolone ointment (KENALOG) 0.1 % Apply 1 application topically 2 (two) times daily. 01/23/20 01/22/21  Midge Minium, MD  vitamin B-12 (CYANOCOBALAMIN) 1000 MCG tablet Take 1,000 mcg by mouth daily. 12/16/11   [provider]     Allergies    Penicillins   Review of Systems    Review of Systems A comprehensive review of systems was completed and negative except as noted in HPI.    Physical Exam BP 100/68   Pulse 80   Temp 98.9 F (37.2 C) (Oral)   Resp 14   Ht 5' 8"  (1.727 m)   Wt 64.9 kg   SpO2 98%   BMI 21.74 kg/m   Physical Exam Vitals and nursing note reviewed.  Constitutional:      Appearance: Normal appearance.  HENT:     Head: Normocephalic and atraumatic.     Nose: Nose normal. No rhinorrhea.     Mouth/Throat:     Mouth: Mucous membranes are moist. No oral lesions.     Pharynx: No pharyngeal swelling, oropharyngeal exudate, posterior oropharyngeal erythema or uvula swelling.     Tonsils: No tonsillar exudate or tonsillar abscesses.  Eyes:     Extraocular Movements: Extraocular movements intact.     Conjunctiva/sclera: Conjunctivae normal.  Neck:     Thyroid: No thyromegaly (thyroid is surgically absent).  Cardiovascular:     Rate and Rhythm: Normal rate.  Pulmonary:     Effort: Pulmonary effort is normal.     Breath sounds: Normal breath sounds.  Abdominal:     General: Abdomen is flat.     Palpations: Abdomen is soft.     Tenderness: There is no abdominal tenderness.  Musculoskeletal:        General: No swelling. Normal range of motion.     Cervical back: Neck supple.  Lymphadenopathy:     Cervical: No cervical adenopathy.  Skin:    General: Skin is warm and dry.  Neurological:     General: No focal deficit present.     Mental Status: She is alert.  Psychiatric:        Mood and Affect: Mood normal.      ED Results / Procedures / Treatments   Labs (all labs ordered are listed, but only abnormal results are displayed) Labs Reviewed  GROUP A STREP BY PCR - Abnormal; Notable for the following components:      Result Value   Group A Strep by PCR NONE DETECTED (*)    All other components within normal limits    EKG None   Radiology No results found.  Procedures Procedures  Medications Ordered in the  ED Medications - No data to display   MDM Rules/Calculators/A&P MDM Patient here essentially just to get an antibiotic. I explained that most benign pharyngitis infections do not require antibiotics but  we will check her for strep to be sure. Given the duration of her symptoms and her smoking history, I would also recommend ENT follow up for more detailed exam including consideration of laryngoscopy.  ED Course  I have reviewed the triage vital signs and the nursing notes.  Pertinent labs & imaging results that were available during my care of the patient were reviewed by me and considered in my medical decision making (see chart for details).  Clinical Course as of 11/02/20 1728  Fri Nov 02, 2020  1725 Strep is negative, patient informed that antibiotics are not indicated. Will give NSAIDs to help with her discomfort. Recommend PCP and ENT evaluation for chronic sore throat.  [CS]    Clinical Course User Index [CS] Truddie Hidden, MD    Final Clinical Impression(s) / ED Diagnoses Final diagnoses:  Chronic sore throat    Rx / DC Orders ED Discharge Orders         Ordered    naproxen (NAPROSYN) 500 MG tablet  2 times daily        11/02/20 1727           Truddie Hidden, MD 11/02/20 1728

## 2020-11-03 ENCOUNTER — Encounter: Payer: Self-pay | Admitting: Family Medicine

## 2020-11-05 ENCOUNTER — Encounter: Payer: Self-pay | Admitting: Internal Medicine

## 2020-11-21 ENCOUNTER — Telehealth (INDEPENDENT_AMBULATORY_CARE_PROVIDER_SITE_OTHER): Payer: Medicare Other | Admitting: Registered Nurse

## 2020-11-21 ENCOUNTER — Encounter: Payer: Self-pay | Admitting: Family Medicine

## 2020-11-21 DIAGNOSIS — U071 COVID-19: Secondary | ICD-10-CM | POA: Diagnosis not present

## 2020-11-21 DIAGNOSIS — R197 Diarrhea, unspecified: Secondary | ICD-10-CM

## 2020-11-21 MED ORDER — MOLNUPIRAVIR EUA 200MG CAPSULE
4.0000 | ORAL_CAPSULE | Freq: Two times a day (BID) | ORAL | 0 refills | Status: AC
Start: 2020-11-21 — End: 2020-11-26

## 2020-11-21 NOTE — Progress Notes (Signed)
Telemedicine Encounter- SOAP NOTE Established Patient  This telephone encounter was conducted with the patient's (or proxy's) verbal consent via audio telecommunications: yes  Patient was instructed to have this encounter in a suitably private space; and to only have persons present to whom they give permission to participate. In addition, patient identity was confirmed by use of name plus two identifiers (DOB and address).  I discussed the limitations, risks, security and privacy concerns of performing an evaluation and management service by telephone and the availability of in person appointments. I also discussed with the patient that there may be a patient responsible charge related to this service. The patient expressed understanding and agreed to proceed.  I spent a total of 15 minutes talking with the patient or their proxy.  Patient at home Provider in office  Participants: Kathrin Ruddy, NP and Vanetta Mulders Chief Complaint  Patient presents with  . Covid Positive    Diarrhea,headache,fever,chills,cough x 3 days     Subjective   Tricia Woodward is a 65 y.o. established patient. Telephone visit today for COVID+  HPI Symptoms onset Monday, worsened Monday night Diarrhea, chills, fatigue, malaise, mild cough, low grade temp Diarrhea: waxing and waning. No blood or mucus. No incontinence Chills, fatigue, Malaise: steady, no improvement. Has been resting and hydrating with water and Pedialyte Cough: intermittent. Nonproductive. No shob or doe. Low grade temp: hit 100.0. not higher. Cannot take tylenol d/t Hep C infection. Has been taking aleve  Otherwise no concerns. Past infection in Feb 2020 was much more severe. Has not been immunized against COVID.  Patient Active Problem List   Diagnosis Date Noted  . B12 deficiency 08/27/2020  . Primary osteoarthritis of left knee 01/04/2020  . Chronic hepatitis C without hepatic coma ([place]) 11/29/2019  . Chronic left-sided low  back pain without sciatica 11/23/2019  . Cervical radiculopathy 11/23/2019  . Knee mass, left 11/23/2019  . Pre-diabetes 06/27/2019  . Numbness 07/21/2018  . Status post carpal tunnel release 04/17/2016  . Physical exam 05/03/2015  . OAB (overactive bladder) 11/02/2014  . Post-surgical hypothyroidism 10/10/2014  . Visual loss 10/10/2014  . Hypotension 10/10/2014  . Chronic back pain 09/07/2013  . Paresthesia of hand, bilateral 09/07/2013  . Recurrent falls 01/17/2013  . TOBACCO ABUSE 05/15/2010  . GOITER 05/14/2010  . Hyperlipidemia 05/14/2010  . HYPOCALCEMIA 05/14/2010  . HYPOPARATHYROIDISM 03/14/2010    Past Medical History:  Diagnosis Date  . Allergy   . Arthritis   . Frequent falls   . Heart murmur   . Hemorrhoids    past hx   . Low blood sugar   . Thyroid disease     Current Outpatient Medications  Medication Sig Dispense Refill  . aspirin 81 MG tablet Take 81 mg by mouth daily.     . calcitRIOL (ROCALTROL) 0.5 MCG capsule Take 2 capsules (1 mcg total) by mouth daily. 60 capsule 11  . cholecalciferol (VITAMIN D3) 25 MCG (1000 UT) tablet Take 1,000 Units by mouth daily.     Marland Kitchen COVID-19 Specimen Collection KIT USE 1 KIT TODAY AS DIRECTED    . diclofenac sodium (VOLTAREN) 1 % GEL Apply 2 g topically 4 (four) times daily. 1 Tube 5  . EUTHYROX 100 MCG tablet TAKE 1 TABLET BY MOUTH ONCE DAILY BEFORE BREAKFAST 30 tablet 2  . fish oil-omega-3 fatty acids 1000 MG capsule Take 2 g by mouth daily.     . Glecaprevir-Pibrentasvir (MAVYRET) 100-40 MG TABS Take 3 tablets by  mouth daily with breakfast. 84 tablet 1  . methocarbamol (ROBAXIN) 500 MG tablet Take 1 tablet (500 mg total) by mouth at bedtime as needed for muscle spasms. 20 tablet 0  . molnupiravir EUA 200 mg CAPS Take 4 capsules (800 mg total) by mouth 2 (two) times daily for 5 days. 40 capsule 0  . Multiple Vitamin (MULTIVITAMIN) tablet Take 1 tablet by mouth daily.    . rosuvastatin (CRESTOR) 20 MG tablet Take 1 tablet  (20 mg total) by mouth daily. 90 tablet 1  . traMADol (ULTRAM) 50 MG tablet Take 1 tablet by mouth twice daily as needed 30 tablet 0  . triamcinolone ointment (KENALOG) 0.1 % Apply 1 application topically 2 (two) times daily. 90 g 1  . vitamin B-12 (CYANOCOBALAMIN) 1000 MCG tablet Take 1,000 mcg by mouth daily.     No current facility-administered medications for this visit.    Allergies  Allergen Reactions  . Penicillins     REACTION: Yeast Infections    Social History   Socioeconomic History  . Marital status: Single    Spouse name: Not on file  . Number of children: Not on file  . Years of education: Not on file  . Highest education level: Not on file  Occupational History  . Not on file  Tobacco Use  . Smoking status: Heavy Tobacco Smoker    Packs/day: 0.50    Types: Cigarettes    Start date: 12/16/1978  . Smokeless tobacco: Never Used  . Tobacco comment: e cigs at times  Vaping Use  . Vaping Use: Former  Substance and Sexual Activity  . Alcohol use: Yes    Alcohol/week: 5.0 standard drinks    Types: 2 Glasses of wine, 3 Cans of beer per week    Comment: 4-5  drinks per times a week   . Drug use: No  . Sexual activity: Not Currently  Other Topics Concern  . Not on file  Social History Narrative  . Not on file   Social Determinants of Health   Financial Resource Strain: Not on file  Food Insecurity: Not on file  Transportation Needs: Not on file  Physical Activity: Not on file  Stress: Not on file  Social Connections: Not on file  Intimate Partner Violence: Not on file    ROS Per hpi, otherwise negative  Objective   Vitals as reported by the patient: There were no vitals filed for this visit.  Tricia Woodward was seen today for covid positive.  Diagnoses and all orders for this visit:  COVID-19 -     molnupiravir EUA 200 mg CAPS; Take 4 capsules (800 mg total) by mouth 2 (two) times daily for 5 days.  Diarrhea, unspecified type   PLAN  molnupiravir  286m capsules: take 4 caps (8069m by mouth twice daily for five days  Ok to use naproxen for aches. Avoid tylenol with Hep C dx  Ok to use intermittent imodium for diarrhea.  ER precautions and rtc precautions reviewed. Pt voices understanding  Patient encouraged to call clinic with any questions, comments, or concerns.   I discussed the assessment and treatment plan with the patient. The patient was provided an opportunity to ask questions and all were answered. The patient agreed with the plan and demonstrated an understanding of the instructions.   The patient was advised to call back or seek an in-person evaluation if the symptoms worsen or if the condition fails to improve as anticipated.  I provided 15 minutes of  non-face-to-face time during this encounter.  Maximiano Coss, NP  Primary Care at [facility]

## 2020-11-26 ENCOUNTER — Encounter: Payer: Self-pay | Admitting: Family Medicine

## 2020-12-11 ENCOUNTER — Other Ambulatory Visit: Payer: Self-pay | Admitting: Family Medicine

## 2020-12-11 DIAGNOSIS — Z09 Encounter for follow-up examination after completed treatment for conditions other than malignant neoplasm: Secondary | ICD-10-CM

## 2020-12-12 ENCOUNTER — Encounter: Payer: Self-pay | Admitting: *Deleted

## 2020-12-13 ENCOUNTER — Encounter: Payer: Self-pay | Admitting: Family Medicine

## 2021-01-01 DIAGNOSIS — R070 Pain in throat: Secondary | ICD-10-CM | POA: Diagnosis not present

## 2021-01-01 DIAGNOSIS — R221 Localized swelling, mass and lump, neck: Secondary | ICD-10-CM | POA: Diagnosis not present

## 2021-01-04 ENCOUNTER — Ambulatory Visit
Admission: RE | Admit: 2021-01-04 | Discharge: 2021-01-04 | Disposition: A | Discharge status: Home / Self Care | Payer: Medicare Other | Source: Ambulatory Visit | Attending: Family Medicine | Admitting: Family Medicine

## 2021-01-04 ENCOUNTER — Encounter: Payer: Medicare Other | Admitting: Family Medicine

## 2021-01-04 ENCOUNTER — Other Ambulatory Visit: Payer: Self-pay | Admitting: Family Medicine

## 2021-01-04 ENCOUNTER — Other Ambulatory Visit: Payer: Self-pay

## 2021-01-04 DIAGNOSIS — Z09 Encounter for follow-up examination after completed treatment for conditions other than malignant neoplasm: Secondary | ICD-10-CM

## 2021-01-04 DIAGNOSIS — N611 Abscess of the breast and nipple: Secondary | ICD-10-CM | POA: Diagnosis not present

## 2021-01-04 DIAGNOSIS — R922 Inconclusive mammogram: Secondary | ICD-10-CM | POA: Diagnosis not present

## 2021-01-20 ENCOUNTER — Other Ambulatory Visit: Payer: Self-pay | Admitting: Family Medicine

## 2021-01-21 ENCOUNTER — Ambulatory Visit (INDEPENDENT_AMBULATORY_CARE_PROVIDER_SITE_OTHER): Payer: Medicare Other | Admitting: Family Medicine

## 2021-01-21 ENCOUNTER — Other Ambulatory Visit: Payer: Self-pay

## 2021-01-21 ENCOUNTER — Ambulatory Visit: Payer: Medicare Other | Admitting: Family Medicine

## 2021-01-21 VITALS — BP 122/74 | HR 79 | Temp 98.3°F | Resp 17 | Ht 68.0 in | Wt 131.1 lb

## 2021-01-21 DIAGNOSIS — E209 Hypoparathyroidism, unspecified: Secondary | ICD-10-CM | POA: Diagnosis not present

## 2021-01-21 DIAGNOSIS — R5383 Other fatigue: Secondary | ICD-10-CM

## 2021-01-21 DIAGNOSIS — R200 Anesthesia of skin: Secondary | ICD-10-CM

## 2021-01-21 DIAGNOSIS — R21 Rash and other nonspecific skin eruption: Secondary | ICD-10-CM

## 2021-01-21 DIAGNOSIS — R631 Polydipsia: Secondary | ICD-10-CM | POA: Diagnosis not present

## 2021-01-21 DIAGNOSIS — E785 Hyperlipidemia, unspecified: Secondary | ICD-10-CM

## 2021-01-21 DIAGNOSIS — R202 Paresthesia of skin: Secondary | ICD-10-CM

## 2021-01-21 DIAGNOSIS — G4700 Insomnia, unspecified: Secondary | ICD-10-CM | POA: Diagnosis not present

## 2021-01-21 DIAGNOSIS — R739 Hyperglycemia, unspecified: Secondary | ICD-10-CM

## 2021-01-21 DIAGNOSIS — R350 Frequency of micturition: Secondary | ICD-10-CM | POA: Diagnosis not present

## 2021-01-21 DIAGNOSIS — E890 Postprocedural hypothyroidism: Secondary | ICD-10-CM | POA: Diagnosis not present

## 2021-01-21 LAB — GLUCOSE, POCT (MANUAL RESULT ENTRY): POC Glucose: 82 mg/dl (ref 70–99)

## 2021-01-21 MED ORDER — CLOTRIMAZOLE-BETAMETHASONE 1-0.05 % EX CREA: 1.0000 "application " | TOPICAL_CREAM | Freq: Two times a day (BID) | CUTANEOUS | 0 refills | Status: DC

## 2021-01-21 NOTE — Patient Instructions (Signed)
Try the new cream Lotrisone twice per day to foot rash.  I expect that to improve symptoms.  I will check your cholesterol, no change in meds for now.  I have checked multiple different blood tests to evaluate your increased thirst, weight loss, and other symptoms.  Please follow-up in the next 2 weeks to discuss this results and symptoms further, sooner if any new or worsening symptoms.  Return to the clinic or go to the nearest emergency room if any of your symptoms worsen or new symptoms occur.  Fatigue If you have fatigue, you feel tired all the time and have a lack of energy or a lack of motivation. Fatigue may make it difficult to start or complete tasks because of exhaustion. In general, occasional or mild fatigue is often a normal response to activity or life. However, long-lasting (chronic) or extreme fatigue may be a symptom of a medical condition. Follow these instructions at home: General instructions Watch your fatigue for any changes. Go to bed and get up at the same time every day. Avoid fatigue by pacing yourself during the day and getting enough sleep at night. Maintain a healthy weight. Medicines Take over-the-counter and prescription medicines only as told by your health care provider. Take a multivitamin, if told by your health care provider.  Do not use herbal or dietary supplements unless they are approved by your health care provider. Activity  Exercise regularly, as told by your health care provider. Use or practice techniques to help you relax, such as yoga, tai chi, meditation, or massage therapy.  Eating and drinking  Avoid heavy meals in the evening. Eat a well-balanced diet, which includes lean proteins, whole grains, plenty of fruits and vegetables, and low-fat dairy products. Avoid consuming too much caffeine. Avoid the use of alcohol. Drink enough fluid to keep your urine pale yellow.  Lifestyle Change situations that cause you stress. Try to keep  your work and personal schedule in balance. Do not use any products that contain nicotine or tobacco, such as cigarettes and e-cigarettes. If you need help quitting, ask your health care provider. Do not use drugs. Contact a health care provider if: Your fatigue does not get better. You have a fever. You suddenly lose or gain weight. You have headaches. You have trouble falling asleep or sleeping through the night. You feel angry, guilty, anxious, or sad. You are unable to have a bowel movement (constipation). Your skin is dry. You have swelling in your legs or another part of your body. Get help right away if: You feel confused. Your vision is blurry. You feel faint or you pass out. You have a severe headache. You have severe pain in your abdomen, your back, or the area between your waist and hips (pelvis). You have chest pain, shortness of breath, or an irregular or fast heartbeat. You are unable to urinate, or you urinate less than normal. You have abnormal bleeding, such as bleeding from the rectum, vagina, nose, lungs, or nipples. You vomit blood. You have thoughts about hurting yourself or others. If you ever feel like you may hurt yourself or others, or have thoughts about taking your own life, get help right away. You can go to your nearest emergency department or call: Your local emergency services (911 in the [place] A suicide crisis helpline, such as the [facility] at 347-686-3727. This is open 24 hours a day. Summary If you have fatigue, you feel tired all the time and have a  lack of energy or a lack of motivation. Fatigue may make it difficult to start or complete tasks because of exhaustion. Long-lasting (chronic) or extreme fatigue may be a symptom of a medical condition. Exercise regularly, as told by your health care provider. Change situations that cause you stress. Try to keep your work and personal schedule in balance. This information  is not intended to replace advice given to you by your health care provider. Make sure you discuss any questions you have with your healthcare provider. Document Revised: 04/12/2020 Document Reviewed: 04/12/2020 Elsevier Patient Education  2022 [employer].

## 2021-01-21 NOTE — Progress Notes (Signed)
Subjective:  Patient ID: Tricia Woodward, female    DOB: 11/04/1955  Age: 65 y.o. MRN: [id number]  CC:  Chief Complaint  Patient presents with   Blister    Pt has blisters on her feet -long standing summer time problem, typically used kenalog cream notes this does not work as well as previously, notes tried a friends cream and this worked -Terbinafine hydrochloride cream    Fatigue    Pt notes recent fatigue, increased nocturia, burning in her feet at night time, increased and severe thirst as well as weight loss and pt notes would like her labs checked    Hyperlipidemia    Pt would like her levels checked to see if she Is eligible to stop cholesterol meds     HPI [name] presents for multiple concerns above.  PCP Dr. Birdie Riddle.  Foot rash: Recurrent in the summertime, has used Kenalog in the past, worked last year. Blisters on base of feet every summer. Recently used friend's terbinafine that worked better.- used for 2 days - improved and throbbing stopped. Sore, burning.   Fatigue Chronic nocturia. 2-3 times per night - same, seen by urologist in past no recent changes.  Fatigued lately - past few weeks. No CP/dyspnea. Waking up 4-5 times/night.  Some snoring falling asleep? Has been told she snores. Daytime somnolence.  Thirsty all the time - past few weeks. No blurry vision, no n/v/abd pain.  Glucose 78 in 07/2020, 111 in March.  Episodic ear ringing for a minute at a time, then goes away.  Burning in both ankles at night past few weeks. Rubbing alcohol soothers burning.  Tramadol intermittently - 2-3 times per month. No regular use mm. relaxant.  History of diabetes by problem list, most recent A1c in May 2021 was normal.  She does take Euthyrox 100 mcg daily for hypothyroidism, calcitriol with hx of hypothyroidism.  12# weight loss since May.   History of chronic hepatitis C. Lab Results  Component Value Date   HGBA1C 5.2 09/07/2019   Lab Results  Component Value  Date   TSH 2.10 08/27/2020   Lab Results  Component Value Date   WBC 5.1 01/03/2020   HGB 13.3 01/03/2020   HCT 39.9 01/03/2020   MCV 93.2 01/03/2020   PLT 273.0 01/03/2020   Wt Readings from Last 3 Encounters:  01/21/21 131 lb 1.6 oz (59.5 kg)  11/02/20 143 lb (64.9 kg)  09/25/20 140 lb (63.5 kg)   Lab Results  Component Value Date   CALCIUM 8.5 08/27/2020   PHOS 4.6 03/28/2020     Hyperlipidemia: Crestor 20 mg daily. No new side effects known. Same dose for awhile. No change in diet/exercise.  10# weight loss with covid, regained 5-7.   Lab Results  Component Value Date   CHOL 202 (H) 07/30/2020   HDL 105.80 07/30/2020   LDLCALC 82 07/30/2020   LDLDIRECT 111.7 10/13/2006   TRIG 71.0 07/30/2020   CHOLHDL 2 07/30/2020   Lab Results  Component Value Date   ALT 16 07/30/2020   AST 22 07/30/2020   GGT 130 (H) 11/28/2019   ALKPHOS 46 07/30/2020   BILITOT 0.8 07/30/2020       History Patient Active Problem List   Diagnosis Date Noted   B12 deficiency 08/27/2020   Primary osteoarthritis of left knee 01/04/2020   Chronic hepatitis C without hepatic coma (HCC) 11/29/2019   Chronic left-sided low back pain without sciatica 11/23/2019   Cervical radiculopathy 11/23/2019  Knee mass, left 11/23/2019   Pre-diabetes 06/27/2019   Numbness 07/21/2018   Status post carpal tunnel release 04/17/2016   Physical exam 05/03/2015   OAB (overactive bladder) 11/02/2014   Post-surgical hypothyroidism 10/10/2014   Visual loss 10/10/2014   Hypotension 10/10/2014   Chronic back pain 09/07/2013   Paresthesia of hand, bilateral 09/07/2013   Recurrent falls 01/17/2013   TOBACCO ABUSE 05/15/2010   GOITER 05/14/2010   Hyperlipidemia 05/14/2010   HYPOCALCEMIA 05/14/2010   HYPOPARATHYROIDISM 03/14/2010   Past Medical History:  Diagnosis Date   Allergy    Arthritis    Frequent falls    Heart murmur    Hemorrhoids    past hx    Low blood sugar    Thyroid disease     Past Surgical History:  Procedure Laterality Date   ABDOMINAL HYSTERECTOMY     partial   BREAST CYST EXCISION Bilateral 1983   BUNIONECTOMY     CARPAL TUNNEL RELEASE Left 2017   DENTAL SURGERY     THYROID SURGERY Bilateral 1970   Allergies  Allergen Reactions   Penicillins     REACTION: Yeast Infections   Prior to Admission medications   Medication Sig Start Date End Date Taking? Authorizing Provider  aspirin 81 MG tablet Take 81 mg by mouth daily.    Yes [provider]  calcitRIOL (ROCALTROL) 0.5 MCG capsule Take 2 capsules (1 mcg total) by mouth daily. 03/29/20  Yes Renato Shin, MD  cholecalciferol (VITAMIN D3) 25 MCG (1000 UT) tablet Take 1,000 Units by mouth daily.    Yes [provider]  COVID-19 Specimen Collection KIT USE 1 KIT TODAY AS DIRECTED 04/09/20  Yes [provider]  diclofenac sodium (VOLTAREN) 1 % GEL Apply 2 g topically 4 (four) times daily. 03/04/18  Yes Midge Minium, MD  EUTHYROX 100 MCG tablet TAKE 1 TABLET BY MOUTH ONCE DAILY BEFORE BREAKFAST 10/16/20  Yes Midge Minium, MD  fish oil-omega-3 fatty acids 1000 MG capsule Take 2 g by mouth daily.    Yes [provider]  Glecaprevir-Pibrentasvir (MAVYRET) 100-40 MG TABS Take 3 tablets by mouth daily with breakfast. 12/21/19  Yes Kuppelweiser, Cassie L, RPH-CPP  methocarbamol (ROBAXIN) 500 MG tablet Take 1 tablet (500 mg total) by mouth at bedtime as needed for muscle spasms. 11/04/19  Yes Midge Minium, MD  Multiple Vitamin (MULTIVITAMIN) tablet Take 1 tablet by mouth daily.   Yes [provider]  rosuvastatin (CRESTOR) 20 MG tablet Take 1 tablet (20 mg total) by mouth daily. 08/03/20  Yes Midge Minium, MD  traMADol Veatrice Bourbon) 50 MG tablet Take 1 tablet by mouth twice daily as needed 10/03/20  Yes Midge Minium, MD  triamcinolone ointment (KENALOG) 0.1 % Apply 1 application topically 2 (two) times daily. 01/23/20 01/22/21 Yes Midge Minium, MD   vitamin B-12 (CYANOCOBALAMIN) 1000 MCG tablet Take 1,000 mcg by mouth daily. 12/16/11  Yes [provider]   Social History   Socioeconomic History   Marital status: Single    Spouse name: Not on file   Number of children: Not on file   Years of education: Not on file   Highest education level: Not on file  Occupational History   Not on file  Tobacco Use   Smoking status: Heavy Smoker    Packs/day: 0.50    Types: Cigarettes    Start date: 12/16/1978   Smokeless tobacco: Never   Tobacco comments:    e cigs at times  Vaping Use   Vaping Use: Former  Substance and Sexual Activity   Alcohol use: Yes    Alcohol/week: 5.0 standard drinks    Types: 2 Glasses of wine, 3 Cans of beer per week    Comment: 4-5  drinks per times a week    Drug use: No   Sexual activity: Not Currently  Other Topics Concern   Not on file  Social History Narrative   Not on file   Social Determinants of Health   Financial Resource Strain: Not on file  Food Insecurity: Not on file  Transportation Needs: Not on file  Physical Activity: Not on file  Stress: Not on file  Social Connections: Not on file  Intimate Partner Violence: Not on file    Review of Systems Per HPI.   Objective:   Vitals:   01/21/21 1347  BP: 122/74  Pulse: 79  Resp: 17  Temp: 98.3 F (36.8 C)  TempSrc: Temporal  SpO2: 96%  Weight: 131 lb 1.6 oz (59.5 kg)  Height: 5' 8"  (1.727 m)     Physical Exam Vitals reviewed.  Constitutional:      General: She is not in acute distress.    Appearance: Normal appearance. She is well-developed. She is not ill-appearing, toxic-appearing or diaphoretic.  HENT:     Head: Normocephalic and atraumatic.  Eyes:     Conjunctiva/sclera: Conjunctivae normal.     Pupils: Pupils are equal, round, and reactive to light.  Neck:     Vascular: No carotid bruit.  Cardiovascular:     Rate and Rhythm: Normal rate and regular rhythm.     Heart sounds: Normal heart sounds. No murmur  heard.   No gallop.  Pulmonary:     Effort: Pulmonary effort is normal.     Breath sounds: Normal breath sounds.  Abdominal:     Palpations: Abdomen is soft. There is no pulsatile mass.     Tenderness: There is no abdominal tenderness.  Musculoskeletal:     Right lower leg: No edema.     Left lower leg: No edema.  Skin:    General: Skin is warm and dry.  Neurological:     Mental Status: She is alert and oriented to person, place, and time.  Psychiatric:        Mood and Affect: Mood normal.        Behavior: Behavior normal.         Assessment & Plan:  JNAYA BUTRICK is a 65 y.o. female . Fatigue, unspecified type - Plan: Comprehensive metabolic panel, CBC Increased thirst Frequent urination Insomnia, unspecified type Hyperglycemia - Plan: Hemoglobin A1c, POCT glucose (manual entry) Post-surgical hypothyroidism - Plan: TSH Hypoparathyroidism, unspecified hypoparathyroidism type ([place]) - Plan: PTH, Intact and Calcium  -Fatigue as above with chronic frequent urination, some recent change in thirst, difficulty with sleep.  Possibly multifactorial.  Hyperglycemia but in office glucose reassuring.  History of hypothyroidism, hypoparathyroidism.  Check TSH, PTH, calcium.Check CBC, CMP.  Recheck in the next 2 weeks, ER/RTC precautions if worse sooner.  Hyperlipidemia, unspecified hyperlipidemia type - Plan: Comprehensive metabolic panel, Hemoglobin A1c, Lipid panel  -Check labs, no changes.  Tolerating current regimen.  Rash of foot - Plan: clotrimazole-betamethasone (LOTRISONE) cream Numbness and tingling of foot  -Labs as above.  Possible tinea pedis bullous versus less likely dyshidrotic eczema.  Start Lotrisone  Meds ordered this encounter  Medications   clotrimazole-betamethasone (LOTRISONE) cream    Sig: Apply 1 application topically 2 (two) times  daily. To foot rash    Dispense:  30 g    Refill:  0   Patient Instructions  Try the new cream Lotrisone twice per day to  foot rash.  I expect that to improve symptoms.  I will check your cholesterol, no change in meds for now.  I have checked multiple different blood tests to evaluate your increased thirst, weight loss, and other symptoms.  Please follow-up in the next 2 weeks to discuss this results and symptoms further, sooner if any new or worsening symptoms.  Return to the clinic or go to the nearest emergency room if any of your symptoms worsen or new symptoms occur.  Fatigue If you have fatigue, you feel tired all the time and have a lack of energy or a lack of motivation. Fatigue may make it difficult to start or complete tasks because of exhaustion. In general, occasional or mild fatigue is often a normal response to activity or life. However, long-lasting (chronic) or extreme fatigue may be a symptom of a medical condition. Follow these instructions at home: General instructions Watch your fatigue for any changes. Go to bed and get up at the same time every day. Avoid fatigue by pacing yourself during the day and getting enough sleep at night. Maintain a healthy weight. Medicines Take over-the-counter and prescription medicines only as told by your health care provider. Take a multivitamin, if told by your health care provider.  Do not use herbal or dietary supplements unless they are approved by your health care provider. Activity  Exercise regularly, as told by your health care provider. Use or practice techniques to help you relax, such as yoga, tai chi, meditation, or massage therapy.  Eating and drinking  Avoid heavy meals in the evening. Eat a well-balanced diet, which includes lean proteins, whole grains, plenty of fruits and vegetables, and low-fat dairy products. Avoid consuming too much caffeine. Avoid the use of alcohol. Drink enough fluid to keep your urine pale yellow.  Lifestyle Change situations that cause you stress. Try to keep your work and personal schedule in balance. Do  not use any products that contain nicotine or tobacco, such as cigarettes and e-cigarettes. If you need help quitting, ask your health care provider. Do not use drugs. Contact a health care provider if: Your fatigue does not get better. You have a fever. You suddenly lose or gain weight. You have headaches. You have trouble falling asleep or sleeping through the night. You feel angry, guilty, anxious, or sad. You are unable to have a bowel movement (constipation). Your skin is dry. You have swelling in your legs or another part of your body. Get help right away if: You feel confused. Your vision is blurry. You feel faint or you pass out. You have a severe headache. You have severe pain in your abdomen, your back, or the area between your waist and hips (pelvis). You have chest pain, shortness of breath, or an irregular or fast heartbeat. You are unable to urinate, or you urinate less than normal. You have abnormal bleeding, such as bleeding from the rectum, vagina, nose, lungs, or nipples. You vomit blood. You have thoughts about hurting yourself or others. If you ever feel like you may hurt yourself or others, or have thoughts about taking your own life, get help right away. You can go to your nearest emergency department or call: Your local emergency services (911 in the [place] A suicide crisis helpline, such as the [facility]  at 2568034059. This is open 24 hours a day. Summary If you have fatigue, you feel tired all the time and have a lack of energy or a lack of motivation. Fatigue may make it difficult to start or complete tasks because of exhaustion. Long-lasting (chronic) or extreme fatigue may be a symptom of a medical condition. Exercise regularly, as told by your health care provider. Change situations that cause you stress. Try to keep your work and personal schedule in balance. This information is not intended to replace advice given to you by  your health care provider. Make sure you discuss any questions you have with your healthcare provider. Document Revised: 04/12/2020 Document Reviewed: 04/12/2020 Elsevier Patient Education  2022 [place],   Merri Ray, MD [place] 01/21/21 2:38 PM

## 2021-01-21 NOTE — Telephone Encounter (Signed)
LFD 03/04/18 1 tube with 5 refills LOV 11/21/20 NOV 01/21/21 with Dr. Carlota Raspberry

## 2021-01-22 ENCOUNTER — Other Ambulatory Visit: Payer: Self-pay | Admitting: Family Medicine

## 2021-01-22 ENCOUNTER — Encounter: Payer: Self-pay | Admitting: Family Medicine

## 2021-01-22 LAB — COMPREHENSIVE METABOLIC PANEL
ALT: 18 U/L (ref 0–35)
AST: 26 U/L (ref 0–37)
Albumin: 4.6 g/dL (ref 3.5–5.2)
Alkaline Phosphatase: 46 U/L (ref 39–117)
BUN: 19 mg/dL (ref 6–23)
CO2: 29 mEq/L (ref 19–32)
Calcium: 8.7 mg/dL (ref 8.4–10.5)
Chloride: 98 mEq/L (ref 96–112)
Creatinine, Ser: 0.97 mg/dL (ref 0.40–1.20)
GFR: 61.13 mL/min (ref >60.00)
Glucose, Bld: 73 mg/dL (ref 70–99)
Potassium: 3.7 mEq/L (ref 3.5–5.1)
Sodium: 137 mEq/L (ref 135–145)
Total Bilirubin: 0.8 mg/dL (ref 0.2–1.2)
Total Protein: 8.2 g/dL (ref 6.0–8.3)

## 2021-01-22 LAB — LIPID PANEL
Cholesterol: 199 mg/dL (ref 0–200)
HDL: 96.2 mg/dL (ref >39.00)
LDL Cholesterol: 87 mg/dL (ref 0–99)
NonHDL: 102.32
Total CHOL/HDL Ratio: 2
Triglycerides: 75 mg/dL (ref 0.0–149.0)
VLDL: 15 mg/dL (ref 0.0–40.0)

## 2021-01-22 LAB — CBC
HCT: 40.8 % (ref 36.0–46.0)
Hemoglobin: 13.4 g/dL (ref 12.0–15.0)
MCHC: 32.9 g/dL (ref 30.0–36.0)
MCV: 92.8 fl (ref 78.0–100.0)
Platelets: 290 10*3/uL (ref 150.0–400.0)
RBC: 4.39 Mil/uL (ref 3.87–5.11)
RDW: 14.3 % (ref 11.5–15.5)
WBC: 5.1 10*3/uL (ref 4.0–10.5)

## 2021-01-22 LAB — TSH: TSH: 0.97 u[IU]/mL (ref 0.35–5.50)

## 2021-01-22 LAB — PTH, INTACT AND CALCIUM
Calcium: 8.6 mg/dL (ref 8.6–10.4)
PTH: 9 pg/mL — ABNORMAL LOW (ref 16–77)

## 2021-01-22 LAB — HEMOGLOBIN A1C: Hgb A1c MFr Bld: 6.2 % (ref 4.6–6.5)

## 2021-01-22 NOTE — Telephone Encounter (Signed)
Patient had an appointment 01/21/21. TSH was checked, not sre if changes will be made.

## 2021-01-25 ENCOUNTER — Ambulatory Visit (AMBULATORY_SURGERY_CENTER): Payer: Medicare Other | Admitting: *Deleted

## 2021-01-25 ENCOUNTER — Other Ambulatory Visit: Payer: Self-pay

## 2021-01-25 VITALS — Ht 68.5 in | Wt 135.0 lb

## 2021-01-25 DIAGNOSIS — Z86010 Personal history of colonic polyps: Secondary | ICD-10-CM

## 2021-01-25 MED ORDER — NA SULFATE-K SULFATE-MG SULF 17.5-3.13-1.6 GM/177ML PO SOLN: 1.0000 | Freq: Once | ORAL | 0 refills | Status: AC

## 2021-01-25 NOTE — Progress Notes (Signed)
No egg or soy allergy known to patient  No issues with past sedation with any surgeries or procedures Patient denies ever being told they had issues or difficulty with intubation  No FH of Malignant Hyperthermia No diet pills per patient No home 02 use per patient  No blood thinners per patient  Pt denies issues with constipation  No A fib or A flutter  EMMI video to pt or via [place] 19 guidelines implemented in PV today with Pt and RN  Pt is not vaccinated  for Covid - had covid x 2- last infection 11-2020  Due to the COVID-19 pandemic we are asking patients to follow certain guidelines.  Pt aware of COVID protocols and [facility] guidelines   Pt verified name, DOB, address and insurance during PV today. Pt mailed instruction packet to included paper to complete and mail back to [facility] with addressed and stamped envelope, Emmi video, copy of consent form to read and not return, and instructions.Pt encouraged to call with questions or issues.  My Chart instructions to pt as well

## 2021-01-29 ENCOUNTER — Telehealth: Payer: Self-pay | Admitting: Family Medicine

## 2021-01-29 NOTE — Telephone Encounter (Signed)
Replied by labs.

## 2021-01-29 NOTE — Chronic Care Management (AMB) (Signed)
  Chronic Care Management   Outreach Note  01/29/2021 Name: Tricia Woodward MRN: [id number] DOB: August 25, 1955  Referred by: Midge Minium, MD Reason for referral : No chief complaint on file.   An unsuccessful telephone outreach was attempted today. The patient was referred to the pharmacist for assistance with care management and care coordination.   Follow Up Plan:   Tatjana Dellinger [facility] Scheduler

## 2021-01-30 ENCOUNTER — Telehealth: Payer: Self-pay | Admitting: Family Medicine

## 2021-01-30 ENCOUNTER — Telehealth: Payer: Self-pay

## 2021-01-30 NOTE — Progress Notes (Signed)
  Chronic Care Management   Note  01/30/2021 Name: Tricia Woodward MRN: [id number] DOB: 12-22-1955  Tricia Woodward is a 65 y.o. year old female who is a primary care patient of Birdie Riddle, Aundra Millet, MD. I reached out to Tricia Woodward by phone today in response to a referral sent by Tricia Woodward PCP, Midge Minium, MD.   Tricia Woodward was given information about Chronic Care Management services today including:  CCM service includes personalized support from designated clinical staff supervised by her physician, including individualized plan of care and coordination with other care providers 24/7 contact phone numbers for assistance for urgent and routine care needs. Service will only be billed when office clinical staff spend 20 minutes or more in a month to coordinate care. Only one practitioner may furnish and bill the service in a calendar month. The patient may stop CCM services at any time (effective at the end of the month) by phone call to the office staff.   Patient agreed to services and verbal consent obtained.   Follow up plan:   Tricia Woodward

## 2021-01-30 NOTE — Telephone Encounter (Signed)
Pt is calling to see if she will still need to be seen on the 24th since she has appt with Dr Loanne Drilling regarding her Thyroid on 08/26   Pt call back [phone]

## 2021-01-31 NOTE — Telephone Encounter (Signed)
Called pt and informed she was agreeable, appt canceled for now.

## 2021-02-06 ENCOUNTER — Ambulatory Visit: Payer: Medicare Other | Admitting: Family Medicine

## 2021-02-07 ENCOUNTER — Ambulatory Visit (AMBULATORY_SURGERY_CENTER): Payer: Medicare Other | Admitting: Internal Medicine

## 2021-02-07 ENCOUNTER — Encounter: Payer: Self-pay | Admitting: Internal Medicine

## 2021-02-07 ENCOUNTER — Other Ambulatory Visit: Payer: Self-pay

## 2021-02-07 VITALS — BP 116/68 | HR 58 | Temp 97.1°F | Resp 16 | Ht 68.5 in | Wt 135.0 lb

## 2021-02-07 DIAGNOSIS — Z86010 Personal history of colonic polyps: Secondary | ICD-10-CM

## 2021-02-07 DIAGNOSIS — Z1211 Encounter for screening for malignant neoplasm of colon: Secondary | ICD-10-CM | POA: Diagnosis not present

## 2021-02-07 DIAGNOSIS — D125 Benign neoplasm of sigmoid colon: Secondary | ICD-10-CM | POA: Diagnosis not present

## 2021-02-07 MED ORDER — SODIUM CHLORIDE 0.9 % IV SOLN: 500.0000 mL | Freq: Once | INTRAVENOUS | Status: DC

## 2021-02-07 NOTE — Patient Instructions (Signed)
Await pathology- 1 polyp removed Continue your normal medications Next colonoscopy- 5 years   YOU HAD AN ENDOSCOPIC PROCEDURE TODAY AT [facility]:   Refer to the procedure report that was given to you for any specific questions about what was found during the examination.  If the procedure report does not answer your questions, please call your gastroenterologist to clarify.  If you requested that your care partner not be given the details of your procedure findings, then the procedure report has been included in a sealed envelope for you to review at your convenience later.  YOU SHOULD EXPECT: Some feelings of bloating in the abdomen. Passage of more gas than usual.  Walking can help get rid of the air that was put into your GI tract during the procedure and reduce the bloating. If you had a lower endoscopy (such as a colonoscopy or flexible sigmoidoscopy) you may notice spotting of blood in your stool or on the toilet paper. If you underwent a bowel prep for your procedure, you may not have a normal bowel movement for a few days.  Please Note:  You might notice some irritation and congestion in your nose or some drainage.  This is from the oxygen used during your procedure.  There is no need for concern and it should clear up in a day or so.  SYMPTOMS TO REPORT IMMEDIATELY:  Following lower endoscopy (colonoscopy or flexible sigmoidoscopy):  Excessive amounts of blood in the stool  Significant tenderness or worsening of abdominal pains  Swelling of the abdomen that is new, acute  Fever of 100F or higher   For urgent or emergent issues, a gastroenterologist can be reached at any hour by calling [phone]. Do not use MyChart messaging for urgent concerns.    DIET:  We do recommend a small meal at first, but then you may proceed to your regular diet.  Drink plenty of fluids but you should avoid alcoholic beverages for 24 hours.  ACTIVITY:  You should plan to take it  easy for the rest of today and you should NOT DRIVE or use heavy machinery until tomorrow (because of the sedation medicines used during the test).    FOLLOW UP: Our staff will call the number listed on your records 48-72 hours following your procedure to check on you and address any questions or concerns that you may have regarding the information given to you following your procedure. If we do not reach you, we will leave a message.  We will attempt to reach you two times.  During this call, we will ask if you have developed any symptoms of COVID 19. If you develop any symptoms (ie: fever, flu-like symptoms, shortness of breath, cough etc.) before then, please call [phone].  If you test positive for Covid 19 in the 2 weeks post procedure, please call and report this information to [place].    If any biopsies were taken you will be contacted by phone or by letter within the next 1-3 weeks.  Please call us at [phone] if you have not heard about the biopsies in 3 weeks.    SIGNATURES/CONFIDENTIALITY: You and/or your care partner have signed paperwork which will be entered into your electronic medical record.  These signatures attest to the fact that that the information above on your After Visit Summary has been reviewed and is understood.  Full responsibility of the confidentiality of this discharge information lies with you and/or your care-partner.

## 2021-02-07 NOTE — Progress Notes (Signed)
Called to room to assist during endoscopic procedure.  Patient ID and intended procedure confirmed with present staff. Received instructions for my participation in the procedure from the performing physician.  

## 2021-02-07 NOTE — Progress Notes (Signed)
Pt's states no medical or surgical changes since previsit or office visit. VS assessed by C.W 

## 2021-02-07 NOTE — Progress Notes (Signed)
PT taken to PACU. Monitors in place. VSS. Report given to RN. 

## 2021-02-07 NOTE — Progress Notes (Signed)
Patient ID: Tricia Woodward, female   DOB: April 13, 1956, 65 y.o.   MRN: [id number]  GI PREPROCEDURE H&P  HISTORY OF PRESENT ILLNESS:  Tricia Woodward is a 65 y.o. female with a history of adenomatous colon polyps on colonoscopy January 2017.  She presents today for surveillance colonoscopy.  She has no active GI complaints.  No active medical problems.  She tolerated her prep  REVIEW OF SYSTEMS:  All non-GI ROS negative.  Past Medical History:  Diagnosis Date   Allergy    Arthritis    COVID-19 virus infection    Frequent falls    Heart murmur    Hemorrhoids    past hx    History of hepatitis C    treated with meds x 3 months   Hyperlipidemia    Low blood sugar    Thyroid disease    parahypothyroid disease    Past Surgical History:  Procedure Laterality Date   ABDOMINAL HYSTERECTOMY     partial   BREAST CYST EXCISION Bilateral 1983   BUNIONECTOMY     CARPAL TUNNEL RELEASE Left 2017   [facility]     THYROID SURGERY Bilateral 1970    Social History Tricia Woodward  reports that she has been smoking cigarettes. She started smoking about 42 years ago. She has been smoking an average of .2 packs per day. She has never used smokeless tobacco. She reports current alcohol use of about 5.0 standard drinks per week. She reports that she does not use drugs.  family history includes Aneurysm in an other family member; Arthritis in an other family member; Cirrhosis in her mother; Hyperlipidemia in an other family member; Hypertension in her father and sister.  Allergies  Allergen Reactions   Penicillins     REACTION: Yeast Infections       PHYSICAL EXAMINATION:  Vital signs: BP (!) 97/59   Pulse 68   Temp (!) 97.1 F (36.2 C) (Skin)   Ht 5' 8.5" (1.74 m)   Wt 135 lb (61.2 kg)   SpO2 97%   BMI 20.23 kg/m  General: Well-developed, well-nourished, no acute distress HEENT: Sclerae are anicteric, conjunctiva pink. Oral mucosa  intact Lungs: Clear Heart: Regular Abdomen: soft, nontender, nondistended, no obvious ascites, no peritoneal signs, normal bowel sounds. No organomegaly. Extremities: No edema Psychiatric: alert and oriented x3. Cooperative     ASSESSMENT:  1.  History of adenomatous colon polyps.  Here for surveillance   PLAN:  1.  Surveillance colonoscopy

## 2021-02-07 NOTE — Op Note (Signed)
[facility] Patient Name: Tricia Woodward Procedure Date: 02/07/2021 11:29 AM MRN: [id number] Endoscopist: Docia Chuck. Henrene Pastor , MD Age: 65 Referring MD:  Date of Birth: 01-Mar-1956 Gender: Female Account #: [id number] Procedure:                Colonoscopy with cold snare polypectomy x 1 Indications:              High risk colon cancer surveillance: Personal                            history of multiple (3 or more) adenomas. Index                            exam January 2017 Medicines:                Monitored Anesthesia Care Procedure:                Pre-Anesthesia Assessment:                           - Prior to the procedure, a History and Physical                            was performed, and patient medications and                            allergies were reviewed. The patient's tolerance of                            previous anesthesia was also reviewed. The risks                            and benefits of the procedure and the sedation                            options and risks were discussed with the patient.                            All questions were answered, and informed consent                            was obtained. Prior Anticoagulants: The patient has                            taken no previous anticoagulant or antiplatelet                            agents. ASA Grade Assessment: II - A patient with                            mild systemic disease. After reviewing the risks                            and benefits, the patient was deemed in  satisfactory condition to undergo the procedure.                           After obtaining informed consent, the colonoscope                            was passed under direct vision. Throughout the                            procedure, the patient's blood pressure, pulse, and                            oxygen saturations were monitored continuously. The                            Colonoscope was  introduced through the anus and                            advanced to the the cecum, identified by                            appendiceal orifice and ileocecal valve. The                            ileocecal valve, appendiceal orifice, and rectum                            were photographed. The quality of the bowel                            preparation was excellent. The colonoscopy was                            performed without difficulty. The patient tolerated                            the procedure well. The bowel preparation used was                            SUPREP via split dose instruction. Scope In: 11:47:22 AM Scope Out: 11:57:14 AM Scope Withdrawal Time: 0 hours 7 minutes 13 seconds  Total Procedure Duration: 0 hours 9 minutes 52 seconds  Findings:                 A 3 mm polyp was found in the sigmoid colon. The                            polyp was removed with a cold snare. Resection and                            retrieval were complete.                           The exam was otherwise without abnormality on  direct and retroflexion views. Complications:            No immediate complications. Estimated blood loss:                            None. Estimated Blood Loss:     Estimated blood loss: none. Impression:               - One 3 mm polyp in the sigmoid colon, removed with                            a cold snare. Resected and retrieved.                           - The examination was otherwise normal on direct                            and retroflexion views. Recommendation:           - Repeat colonoscopy in 5 years for surveillance.                           - Patient has a contact number available for                            emergencies. The signs and symptoms of potential                            delayed complications were discussed with the                            patient. Return to normal activities tomorrow.                             Written discharge instructions were provided to the                            patient.                           - Resume previous diet.                           - Continue present medications.                           - Await pathology results. Docia Chuck. Henrene Pastor, MD 02/07/2021 12:00:28 PM This report has been signed electronically.

## 2021-02-07 NOTE — Progress Notes (Signed)
Pt denies needing to pass air.  Abdomen soft, no c/o pain.  Encouraged to ambulate and drink warm fluids at home if she begins to feel bloated

## 2021-02-08 ENCOUNTER — Other Ambulatory Visit: Payer: Self-pay

## 2021-02-08 ENCOUNTER — Ambulatory Visit (INDEPENDENT_AMBULATORY_CARE_PROVIDER_SITE_OTHER): Payer: Medicare Other | Admitting: Endocrinology

## 2021-02-08 VITALS — BP 96/60 | HR 74 | Ht 68.5 in | Wt 132.8 lb

## 2021-02-08 DIAGNOSIS — E209 Hypoparathyroidism, unspecified: Secondary | ICD-10-CM | POA: Diagnosis not present

## 2021-02-08 MED ORDER — VITAMIN D 25 MCG (1000 UNIT) PO TABS: 2000.0000 [IU] | ORAL_TABLET | Freq: Every day | ORAL | 3 refills | Status: DC

## 2021-02-08 NOTE — Progress Notes (Signed)
Subjective:    Patient ID: Tricia Woodward, female    DOB: Jul 01, 1955, 65 y.o.   MRN: [id number]  HPI Pt returns for f/u of postsurgical hypoparathyroidism (she had a thyroidectomy in the 1970's, in [place], for suspicious nodules, which proved on pathol to be benign; since then she has had hypoparathyroidism; in mid-2015, she was found to have low Vitamin-D, and was rx'ed ergocalciferol; she also took forteo samples for a few mos in 2015, but had to d/c as it was declined by insurance; she is on chronic rocaltrol; [name] was added in Nov of 2017, but was later recalled).  She reports fatigue and leg burning.  Muscle cramps and numbness are mild.  She says Vit-D is 2000 units/d.  Past Medical History:  Diagnosis Date   Allergy    Arthritis    COVID-19 virus infection    Frequent falls    Heart murmur    Hemorrhoids    past hx    History of hepatitis C    treated with meds x 3 months   Hyperlipidemia    Low blood sugar    Thyroid disease    parahypothyroid disease    Past Surgical History:  Procedure Laterality Date   ABDOMINAL HYSTERECTOMY     partial   BREAST CYST EXCISION Bilateral 1983   BUNIONECTOMY     CARPAL TUNNEL RELEASE Left 2017   COLONOSCOPY     DENTAL SURGERY     POLYPECTOMY     THYROID SURGERY Bilateral 1970    Social History   Socioeconomic History   Marital status: Single    Spouse name: Not on file   Number of children: Not on file   Years of education: Not on file   Highest education level: Not on file  Occupational History   Not on file  Tobacco Use   Smoking status: Heavy Smoker    Packs/day: 0.20    Types: Cigarettes    Start date: 12/16/1978   Smokeless tobacco: Never   Tobacco comments:    e cigs at times  Vaping Use   Vaping Use: Former  Substance and Sexual Activity   Alcohol use: Yes    Alcohol/week: 5.0 standard drinks    Types: 2 Glasses of wine, 3 Cans of beer per week    Comment: 1-2 x   drinks per times a week   Drug use: No    Sexual activity: Not Currently  Other Topics Concern   Not on file  Social History Narrative   Not on file   Social Determinants of Health   Financial Resource Strain: Not on file  Food Insecurity: Not on file  Transportation Needs: Not on file  Physical Activity: Not on file  Stress: Not on file  Social Connections: Not on file  Intimate Partner Violence: Not on file    Current Outpatient Medications on File Prior to Visit  Medication Sig Dispense Refill   aspirin 81 MG tablet Take 81 mg by mouth daily.      calcitRIOL (ROCALTROL) 0.5 MCG capsule Take 2 capsules (1 mcg total) by mouth daily. 60 capsule 11   clotrimazole-betamethasone (LOTRISONE) cream Apply 1 application topically 2 (two) times daily. To foot rash 30 g 0   COVID-19 Specimen Collection KIT      diclofenac Sodium (VOLTAREN) 1 % GEL APPLY 2 G TOPICALLY 4 TIMES DAILY 100 g 0   EUTHYROX 100 MCG tablet TAKE 1 TABLET BY MOUTH ONCE DAILY BEFORE BREAKFAST 30  tablet 0   fish oil-omega-3 fatty acids 1000 MG capsule Take 2 g by mouth daily.      methocarbamol (ROBAXIN) 500 MG tablet Take 1 tablet (500 mg total) by mouth at bedtime as needed for muscle spasms. 20 tablet 0   Multiple Vitamin (MULTIVITAMIN) tablet Take 1 tablet by mouth daily.     rosuvastatin (CRESTOR) 20 MG tablet Take 1 tablet (20 mg total) by mouth daily. 90 tablet 1   traMADol (ULTRAM) 50 MG tablet Take 1 tablet by mouth twice daily as needed 30 tablet 0   TURMERIC PO Take by mouth.     vitamin B-12 (CYANOCOBALAMIN) 1000 MCG tablet Take 1,000 mcg by mouth daily.     No current facility-administered medications on file prior to visit.    Allergies  Allergen Reactions   Penicillins     REACTION: Yeast Infections    Family History  Problem Relation Age of Onset   Cirrhosis Mother    Hypertension Father    Hypertension Sister    Arthritis Other    Hyperlipidemia Other    Aneurysm Other    Colon cancer Neg Hx    Esophageal cancer Neg Hx     Rectal cancer Neg Hx    Stomach cancer Neg Hx    Breast cancer Neg Hx    Colon polyps Neg Hx     BP 96/60 (BP Location: Left Arm, Patient Position: Sitting, Cuff Size: Normal)   Pulse 74   Ht 5' 8.5" (1.74 m)   Wt 132 lb 12.8 oz (60.2 kg)   SpO2 97%   BMI 19.90 kg/m    Review of Systems     Objective:   Physical Exam VITAL SIGNS:  See vs page GENERAL: no distress Neck: a healed scar is present.  I do not appreciate a nodule in the thyroid or elsewhere in the neck.     Lab Results  Component Value Date   PTH 9 (L) 01/21/2021   CALCIUM 8.7 01/21/2021   CALCIUM 8.6 01/21/2021   CAION 1.02 (L) 02/04/2019   PHOS 4.6 03/28/2020       Assessment & Plan:  Hypoparathyroidism: well-controlled.   Patient Instructions  It is not known when "Natpara" will be available again. Please continue the same calcitriol, levothyroxine, and Vitamin-D. Please come back for a follow-up appointment in 6 months.

## 2021-02-08 NOTE — Patient Instructions (Addendum)
It is not known when "Natpara" will be available again. Please continue the same calcitriol, levothyroxine, and Vitamin-D. Please come back for a follow-up appointment in 6 months.

## 2021-02-11 ENCOUNTER — Telehealth: Payer: Self-pay

## 2021-02-11 NOTE — Telephone Encounter (Signed)
  Follow up Call-  Call back number 02/07/2021  Post procedure Call Back phone  # [phone]  Permission to leave phone message Yes  Some recent data might be hidden     Patient questions:  Do you have a fever, pain , or abdominal swelling? No. Pain Score  0 *  Have you tolerated food without any problems? Yes.    Have you been able to return to your normal activities? Yes.    Do you have any questions about your discharge instructions: Diet   No. Medications  No. Follow up visit  No.  Do you have questions or concerns about your Care? No.  Actions: * If pain score is 4 or above: No action needed, pain <4.    Have you developed a fever since your procedure? no  2.   Have you had an respiratory symptoms (SOB or cough) since your procedure? no  3.   Have you tested positive for COVID 19 since your procedure no  4.   Have you had any family members/close contacts diagnosed with the COVID 19 since your procedure?  no   If yes to any of these questions please route to Joylene John, RN and Joella Prince, RN

## 2021-02-12 ENCOUNTER — Encounter: Payer: Self-pay | Admitting: Internal Medicine

## 2021-02-14 ENCOUNTER — Other Ambulatory Visit: Payer: Self-pay | Admitting: Family Medicine

## 2021-02-14 DIAGNOSIS — E785 Hyperlipidemia, unspecified: Secondary | ICD-10-CM

## 2021-02-15 NOTE — Telephone Encounter (Signed)
LFD 10/03/20 #30 with no refills LOV 01/21/21 NOV 03/29/21

## 2021-02-19 ENCOUNTER — Telehealth: Payer: Self-pay

## 2021-02-20 ENCOUNTER — Encounter: Payer: Self-pay | Admitting: Endocrinology

## 2021-02-21 ENCOUNTER — Other Ambulatory Visit: Payer: Self-pay

## 2021-02-21 ENCOUNTER — Telehealth: Payer: Self-pay

## 2021-02-21 DIAGNOSIS — E209 Hypoparathyroidism, unspecified: Secondary | ICD-10-CM

## 2021-02-21 MED ORDER — CALCITRIOL 0.5 MCG PO CAPS: 1.0000 ug | ORAL_CAPSULE | Freq: Every day | ORAL | 11 refills | Status: DC

## 2021-02-21 NOTE — Telephone Encounter (Signed)
Pt called about the last message. She is completely out. Pt would like a call once called in.    calcitRIOL (ROCALTROL) 0.5 MCG capsule [place], [place]

## 2021-02-21 NOTE — Telephone Encounter (Signed)
Patient called again re: Patient states she has been trying to get a RX for calcitRIOL (ROCALTROL) 0.5 MCG capsule Sent to  [place], [place] Phone:  [phone]  Fax:  [phone]    Patient states she has sent MyChart message and called twice. Patient states the [facility] listed above told Patient they have faxed the Rx request to Fax# [phone] - 3 times with no response.  Patient requests to be called at ph# [phone] once the above RX request has been sent to [facility]. Patient states she needs to go out of town but does not want to leave town until she is able to get the above medication due to Patient has been out of the above medication for 2 days.  Note: The above RX was started on 02/08/21 but not completed showing as a (No Print).

## 2021-02-21 NOTE — Telephone Encounter (Signed)
Called and spoke with Jenny Reichmann at [facility] and he stated that patient is approved and did not need a PA for the Tramadol '50mg'$ . She is good through 03/04/21 for a 15 day supply and can get another refill for either 15 or 30 day supply after that date. Called and notified patient and patient understood.

## 2021-02-22 NOTE — Telephone Encounter (Signed)
Pt called requsting refill of medication: [facility] faxed over refill request 9/1 for calcitRIOL (ROCALTROL) 0.5 MCG capsule. Please call pt at mobile number requested.

## 2021-03-25 ENCOUNTER — Telehealth: Payer: Self-pay

## 2021-03-25 NOTE — Progress Notes (Signed)
Chronic Care Management Pharmacy Assistant   Name: Tricia Woodward  MRN: [id number] DOB: August 28, 1955  Tricia Woodward is an 65 y.o. year old female who presents for her initial CCM visit with the clinical pharmacist.  Reason for Encounter: Chart Prep for Initial CPP visit    Recent office visits:  01/21/21 Merri Ray, MD (PCP) - Family Medicine - Fatigue - Labs were ordered. clotrimazole-betamethasone (LOTRISONE) cream Apply 1 application topically 2 (two) times daily prescribed. Follow up in 2 weeks.   11/21/20 Maximiano Coss, NP - Family Medicine (Video visit) - COVID 19 - molnupiravir EUA 200 mg CAPS Take 4 capsules (800 mg total) by mouth 2 (two) times daily for 5 days prescribed. No follow up indicated.    Recent consult visits:  02/08/21 Renato Shin, MD - Endocrinology - Hypothyroidism - cholecalciferol (VITAMIN D3) 25 MCG (1000 UNIT) tablet increased to 2000IU daily. Follow up in 6 months.    Hospital visits: 11/02/20  Medication Reconciliation was completed by comparing discharge summary, patient's EMR and Pharmacy list, and upon discussion with patient.  Admitted to the hospital on 11/02/20 due to Chronic sore throat. Discharge date was 11/02/20. Discharged from [facility].  New?Medications Started at [facility] Discharge:?? Started naproxen (NAPROSYN) 500 MG tablet Take 1 tablet (500 mg total) by mouth 2 (two) times daily.  Medication Changes at Hospital Discharge: No changes noted.   Medications Discontinued at Hospital Discharge: No medications were discontinued at discharge.   Medications that remain the same after Hospital Discharge:??  All other medications will remain the same.     Hospital visits: 09/25/20  Medication Reconciliation was completed by comparing discharge summary, patient's EMR and Pharmacy list, and upon discussion with patient.  Admitted to the hospital on 09/25/20 due to Fall / Arm Laceration. Discharge date was 09/25/20. Discharged  from [facility].  New?Medications Started at [facility] Discharge:?? No medications were prescribed. Tdap vaccination was given.  Medication Changes at Hospital Discharge: No changes noted.   Medications Discontinued at Hospital Discharge: No medications were discontinued at discharge.   Medications that remain the same after Hospital Discharge:??  All other medications will remain the same.     Medications: Outpatient Encounter Medications as of 03/25/2021  Medication Sig   aspirin 81 MG tablet Take 81 mg by mouth daily.    calcitRIOL (ROCALTROL) 0.5 MCG capsule Take 2 capsules (1 mcg total) by mouth daily.   cholecalciferol (VITAMIN D3) 25 MCG (1000 UNIT) tablet Take 2 tablets (2,000 Units total) by mouth daily.   clotrimazole-betamethasone (LOTRISONE) cream Apply 1 application topically 2 (two) times daily. To foot rash   COVID-19 Specimen Collection KIT    diclofenac Sodium (VOLTAREN) 1 % GEL APPLY 2 G TOPICALLY 4 TIMES DAILY   EUTHYROX 100 MCG tablet TAKE 1 TABLET BY MOUTH ONCE DAILY BEFORE BREAKFAST   fish oil-omega-3 fatty acids 1000 MG capsule Take 2 g by mouth daily.    methocarbamol (ROBAXIN) 500 MG tablet Take 1 tablet (500 mg total) by mouth at bedtime as needed for muscle spasms.   Multiple Vitamin (MULTIVITAMIN) tablet Take 1 tablet by mouth daily.   rosuvastatin (CRESTOR) 20 MG tablet Take 1 tablet by mouth once daily   traMADol (ULTRAM) 50 MG tablet Take 1 tablet by mouth twice daily as needed   TURMERIC PO Take by mouth.   vitamin B-12 (CYANOCOBALAMIN) 1000 MCG tablet Take 1,000 mcg by mouth daily.   No facility-administered encounter medications on file as of 03/25/2021.  Have you seen any other providers since your last visit? no  Any changes in your medications or health? no  Any side effects from any medications? no  Do you have an symptoms or problems not managed by your medications? no  Any concerns about your health right now? no  Has  your provider asked that you check blood pressure, blood sugar, or follow special diet at home? no  Do you get any type of exercise on a regular basis? Yes. Three times a week  Can you think of a goal you would like to reach for your health? Patient would like to get better and get off some medications particularly (rosuvastatin).  Do you have any problems getting your medications? no  Is there anything that you would like to discuss during the appointment? Patient would like to discuss ways to possibly come off prescription cholesterol medication.   Please bring medications and supplements to appointment  Patient confirmed appointment for 03/27/21 @ 11:00 (Televisit)    Care Gaps  AWV: overdue (pt reported is scheduled for CPE 03/29/21) Colonoscopy: due 02/07/26 DM Eye Exam: N/A DM Foot Exam: N/A Microalbumin: N/A HbgAIC: done 01/21/21 (6.2) DEXA: done 05/15/15 Mammogram: done 01/05/20  Star Rating Drugs: rosuvastatin (CRESTOR) 20 MG tablet - last filled 02/14/21 90 days    Future Appointments  Date Time Provider [place]  03/27/2021 11:00 AM [facility] CCM PHARMACIST [facility]  03/29/2021  1:30 PM Midge Minium, MD [facility] [place], [place] Pharmacist Assistant  [phone]  Time Spent: 52 minutes

## 2021-03-27 ENCOUNTER — Ambulatory Visit (INDEPENDENT_AMBULATORY_CARE_PROVIDER_SITE_OTHER): Payer: Medicare Other

## 2021-03-27 DIAGNOSIS — E785 Hyperlipidemia, unspecified: Secondary | ICD-10-CM

## 2021-03-27 DIAGNOSIS — E890 Postprocedural hypothyroidism: Secondary | ICD-10-CM

## 2021-03-27 DIAGNOSIS — F17200 Nicotine dependence, unspecified, uncomplicated: Secondary | ICD-10-CM

## 2021-03-27 NOTE — Patient Instructions (Signed)
Tricia Woodward,  Thank you for talking with me today. I have included our care plan/goals in the following pages.   Please review and call me at [phone] with any questions.  Thanks! Ellin Mayhew, PharmD, CPP Clinical Pharmacist Practitioner  [phone]  Care Plan : ccm pharmacy care plan  Updates made by Madelin Rear, [facility] since 03/27/2021 12:00 AM     Problem: HLD preDM smoking cessation OA Hypothyroidism hypoparathyroidism   Priority: High     Long-Range Goal: disease management   Start Date: 03/27/2021  Expected End Date: 03/27/2022  This Visit's Progress: On track  Priority: High  Note:    Current Barriers:  Unable to achieve control of smoking cessation    Pharmacist Clinical Goal(s):  Patient will achieve adherence to monitoring guidelines and medication adherence to achieve therapeutic efficacy through collaboration with PharmD and provider.   Interventions: 1:1 collaboration with Midge Minium, MD regarding development and update of comprehensive plan of care as evidenced by provider attestation and co-signature Inter-disciplinary care team collaboration (see longitudinal plan of care) Comprehensive medication review performed; medication list updated in electronic medical record  Hyperlipidemia: (LDL goal < 100) -Controlled -Current treatment: Rosuvastatin 20 mg once daily  -Medications previously tried: atorvastatin  -Side effect review - noted some previous aches, none at present. No recent gaps identified in [place] hx.  -Educated on Cholesterol goals;  Benefits of statin for ASCVD risk reduction; Importance of limiting foods high in cholesterol; -Recommended to continue current medication  Prediabetes (A1c goal <6.5%) -Controlled -Current medications: No current medications  -Medications previously tried: n/a  -Current home glucose readings fasting glucose: n/a post prandial glucose: n/a -Denies hypoglycemic/hyperglycemic  symptoms -Current meal patterns: some processed foods, breads noted -Current exercise: goes to [employer] 3x/wk - water walking -Educated on A1c and blood sugar goals; -Counseled to check feet daily and get yearly eye exams -Recommended to continue current medication  Hypothyroidism (Goal: ensure appropriate use of levothyroxine) -Controlled -Current treatment  Euthyrox 100 mcg once daily  -Reviewed administration - reports consistent use, no problems noted -Medications previously tried: n/a  -Recommended to continue current medication  Smoking cessation (Goal: stop smoking) -Controlled -time to first cig less than 30 minutes -pattern and emotional triggers -open to NRT options - lozenge  -Current treatment  START Nicotine lozenge 4 mg - Weeks 1 to 6: 1 lozenge every 1 to 2 hours (maximum: 5 lozenges every 6 hours; 20 lozenges/day); to increase chances of quitting, use at least 9 lozenges/day during the first 6 weeks. -Medications previously tried: may have tried oral NRT ~15 yrs ago   -Recommended to continue current medication -Consider DEXA repeat - last 2016, BMI ~20 -Referral placed for Quitline [place]   Patient Goals/Self-Care Activities Patient will:  - take medications as prescribed collaborate with provider on medication access solutions target a minimum of 150 minutes of moderate intensity exercise weekly engage in dietary modifications by reducing carbohydrate intake including bread, pasta, rice, crackers  Medication Assistance: None required.  Patient affirms current coverage meets needs.     The patient was given the following information about Chronic Care Management services today, agreed to services, and gave verbal consent: 1. CCM service includes personalized support from designated clinical staff supervised by the primary care provider, including individualized plan of care and coordination with other care providers 2. 24/7 contact phone numbers for assistance for  urgent and routine care needs. 3. Service will only be billed when office clinical staff  spend 20 minutes or more in a month to coordinate care. 4. Only one practitioner may furnish and bill the service in a calendar month. 5.The patient may stop CCM services at any time (effective at the end of the month) by phone call to the office staff. 6. The patient will be responsible for cost sharing (co-pay) of up to 20% of the service fee (after annual deductible is met). Patient agreed to services and consent obtained.  The patient verbalized understanding of instructions provided today and agreed to receive a MyChart copy of patient instruction and/or educational materials. Telephone follow up appointment with pharmacy team member scheduled for: See next appointment with "Care Management Staff" under "What's Next" below.  High Cholesterol High cholesterol is a condition in which the blood has high levels of a white, waxy substance similar to fat (cholesterol). The liver makes all the cholesterol that the body needs. The human body needs small amounts of cholesterol to help build cells. A person gets extra or excess cholesterol from the food that he or she eats. The blood carries cholesterol from the liver to the rest of the body. If you have high cholesterol, deposits (plaques) may build up on the walls of your arteries. Arteries are the blood vessels that carry blood away from your heart. These plaques make the arteries narrow and stiff. Cholesterol plaques increase your risk for heart attack and stroke. Work with your health care provider to keep your cholesterol levels in a healthy range. What increases the risk? The following factors may make you more likely to develop this condition: Eating foods that are high in animal fat (saturated fat) or cholesterol. Being overweight. Not getting enough exercise. A family history of high cholesterol (familial hypercholesterolemia). Use of tobacco products. Having  diabetes. What are the signs or symptoms? In most cases, high cholesterol does not usually cause any symptoms. In severe cases, very high cholesterol levels can cause: Fatty bumps under the skin (xanthomas). A white or gray ring around the black center (pupil) of the eye. How is this diagnosed? This condition may be diagnosed based on the results of a blood test. If you are older than 67 years of age, your health care provider may check your cholesterol levels every 4-6 years. You may be checked more often if you have high cholesterol or other risk factors for heart disease. The blood test for cholesterol measures: "Bad" cholesterol, or LDL cholesterol. This is the main type of cholesterol that causes heart disease. The desired level is less than 100 mg/dL (2.59 mmol/L). "Good" cholesterol, or HDL cholesterol. HDL helps protect against heart disease by cleaning the arteries and carrying the LDL to the liver for processing. The desired level for HDL is 60 mg/dL (1.55 mmol/L) or higher. Triglycerides. These are fats that your body can store or burn for energy. The desired level is less than 150 mg/dL (1.69 mmol/L). Total cholesterol. This measures the total amount of cholesterol in your blood and includes LDL, HDL, and triglycerides. The desired level is less than 200 mg/dL (5.17 mmol/L). How is this treated? Treatment for high cholesterol starts with lifestyle changes, such as diet and exercise. Diet changes. You may be asked to eat foods that have more fiber and less saturated fats or added sugar. Lifestyle changes. These may include regular exercise, maintaining a healthy weight, and quitting use of tobacco products. Medicines. These are given when diet and lifestyle changes have not worked. You may be prescribed a statin medicine to help  lower your cholesterol levels. Follow these instructions at home: Eating and drinking  Eat a healthy, balanced diet. This diet includes: Daily servings of  a variety of fresh, frozen, or canned fruits and vegetables. Daily servings of whole grain foods that are rich in fiber. Foods that are low in saturated fats and trans fats. These include poultry and fish without skin, lean cuts of meat, and low-fat dairy products. A variety of fish, especially oily fish that contain omega-3 fatty acids. Aim to eat fish at least 2 times a week. Avoid foods and drinks that have added sugar. Use healthy cooking methods, such as roasting, grilling, broiling, baking, poaching, steaming, and stir-frying. Do not fry your food except for stir-frying. If you drink alcohol: Limit how much you have to: 0-1 drink a day for women who are not pregnant. 0-2 drinks a day for men. Know how much alcohol is in a drink. In the [place] one drink equals one 12 oz bottle of beer (355 mL), one 5 oz glass of wine (148 mL), or one 1 oz glass of hard liquor (44 mL). Lifestyle  Get regular exercise. Aim to exercise for a total of 150 minutes a week. Increase your activity level by doing activities such as gardening, walking, and taking the stairs. Do not use any products that contain nicotine or tobacco. These products include cigarettes, chewing tobacco, and vaping devices, such as e-cigarettes. If you need help quitting, ask your health care provider. General instructions Take over-the-counter and prescription medicines only as told by your health care provider. Keep all follow-up visits. This is important. Where to find more information American Heart Association: www.heart.org National Heart, Lung, and Blood Institute: https://wilson-eaton.com/ Contact a health care provider if: You have trouble achieving or maintaining a healthy diet or weight. You are starting an exercise program. You are unable to stop smoking. Get help right away if: You have chest pain. You have trouble breathing. You have discomfort or pain in your jaw, neck, back, shoulder, or arm. You have any symptoms of a  stroke. "BE FAST" is an easy way to remember the main warning signs of a stroke: B - Balance. Signs are dizziness, sudden trouble walking, or loss of balance. E - Eyes. Signs are trouble seeing or a sudden change in vision. F - Face. Signs are sudden weakness or numbness of the face, or the face or eyelid drooping on one side. A - Arms. Signs are weakness or numbness in an arm. This happens suddenly and usually on one side of the body. S - Speech. Signs are sudden trouble speaking, slurred speech, or trouble understanding what people say. T - Time. Time to call emergency services. Write down what time symptoms started. You have other signs of a stroke, such as: A sudden, severe headache with no known cause. Nausea or vomiting. Seizure. These symptoms may represent a serious problem that is an emergency. Do not wait to see if the symptoms will go away. Get medical help right away. Call your local emergency services (911 in the [place] Do not drive yourself to the hospital. Summary Cholesterol plaques increase your risk for heart attack and stroke. Work with your health care provider to keep your cholesterol levels in a healthy range. Eat a healthy, balanced diet, get regular exercise, and maintain a healthy weight. Do not use any products that contain nicotine or tobacco. These products include cigarettes, chewing tobacco, and vaping devices, such as e-cigarettes. Get help right away if you have any  symptoms of a stroke. This information is not intended to replace advice given to you by your health care provider. Make sure you discuss any questions you have with your health care provider. Document Revised: 08/16/2020 Document Reviewed: 08/06/2020 Elsevier Patient Education  2022 [employer].

## 2021-03-27 NOTE — Progress Notes (Signed)
Chronic Care Management Pharmacy Note  03/27/2021 Name:  Tricia Woodward MRN:  [id number] DOB:  [birth year]  Summary: Current 0.5 ppd smoker - will work together on smoking cessation, referral to [facility] placed  Subjective: Tricia Woodward is an 65 y.o. year old female who is a primary patient of Tabori, Aundra Millet, MD.  The CCM team was consulted for assistance with disease management and care coordination needs.    Engaged with patient by telephone for initial visit in response to provider referral for pharmacy case management and/or care coordination services.   Consent to Services:  The patient was given the following information about Chronic Care Management services today, agreed to services, and gave verbal consent: 1. CCM service includes personalized support from designated clinical staff supervised by the primary care provider, including individualized plan of care and coordination with other care providers 2. 24/7 contact phone numbers for assistance for urgent and routine care needs. 3. Service will only be billed when office clinical staff spend 20 minutes or more in a month to coordinate care. 4. Only one practitioner may furnish and bill the service in a calendar month. 5.The patient may stop CCM services at any time (effective at the end of the month) by phone call to the office staff. 6. The patient will be responsible for cost sharing (co-pay) of up to 20% of the service fee (after annual deductible is met). Patient agreed to services and consent obtained.  Patient Care Team: Midge Minium, MD as PCP - General (Family Medicine) Renato Shin, MD as Consulting Physician (Endocrinology) Madelin Rear, [facility] as Pharmacist (Pharmacist)  Hospital visits: None in previous 6 months  Objective:  Lab Results  Component Value Date   CREATININE 0.97 01/21/2021   CREATININE 0.84 08/27/2020   CREATININE 0.90 07/30/2020    Lab Results  Component Value Date   HGBA1C  6.2 01/21/2021   HGBA1C 5.2 09/07/2019   Last diabetic Eye exam: No results found for: HMDIABEYEEXA  Last diabetic Foot exam: No results found for: HMDIABFOOTEX      Component Value Date/Time   CHOL 199 01/21/2021 1452   CHOL 202 (H) 07/30/2020 1142   CHOL 264 (H) 03/07/2020 1126   CHOL 227 (H) 06/27/2019 1156   TRIG 75.0 01/21/2021 1452   TRIG 71.0 07/30/2020 1142   TRIG 93 03/07/2020 1126   HDL 96.20 01/21/2021 1452   HDL 105.80 07/30/2020 1142   HDL 71 03/07/2020 1126   HDL 149 06/27/2019 1156   CHOLHDL 2 01/21/2021 1452   VLDL 15.0 01/21/2021 1452   LDLCALC 87 01/21/2021 1452   LDLCALC 82 07/30/2020 1142   LDLCALC 172 (H) 03/07/2020 1126   LDLCALC 96 01/03/2020 0849   LDLCALC 67 06/27/2019 1156   LDLDIRECT 111.7 10/13/2006 1152   LDLDIRECT 180.4 07/29/2006 1108    Hepatic Function Latest Ref Rng & Units 01/21/2021 07/30/2020 06/20/2020  Total Protein 6.0 - 8.3 g/dL 8.2 8.0 7.5  Albumin 3.5 - 5.2 g/dL 4.6 4.2 -  AST 0 - 37 U/L 26 22 21   ALT 0 - 35 U/L 18 16 15   Alk Phosphatase 39 - 117 U/L 46 46 -  Total Bilirubin 0.2 - 1.2 mg/dL 0.8 0.8 0.6  Bilirubin, Direct 0.0 - 0.3 mg/dL - 0.2 -    Lab Results  Component Value Date/Time   TSH 0.97 01/21/2021 02:52 PM   TSH 2.10 08/27/2020 11:33 AM   FREET4 1.20 08/27/2020 11:33 AM    CBC Latest Ref Rng &  Units 01/21/2021 01/03/2020 11/28/2019  WBC 4.0 - 10.5 K/uL 5.1 5.1 8.9  Hemoglobin 12.0 - 15.0 g/dL 13.4 13.3 12.0  Hematocrit 36.0 - 46.0 % 40.8 39.9 35.9  Platelets 150.0 - 400.0 K/uL 290.0 273.0 280    Lab Results  Component Value Date/Time   VD25OH 57.79 08/27/2020 11:33 AM   VD25OH 54.53 03/28/2020 11:16 AM    Clinical ASCVD:  The 10-year ASCVD risk score (Arnett DK, et al., 2019) is: 8%   Values used to calculate the score:     Age: 74 years     Sex: Female     Is Non-Hispanic African American: Yes     Diabetic: No     Tobacco smoker: Yes     Systolic Blood Pressure: 96 mmHg     Is BP treated: No     HDL  Cholesterol: 96.2 mg/dL     Total Cholesterol: 199 mg/dL   Social History   Tobacco Use  Smoking Status Heavy Smoker   Packs/day: 0.20   Types: Cigarettes   Start date: 12/16/1978  Smokeless Tobacco Never  Tobacco Comments   e cigs at times   BP Readings from Last 3 Encounters:  02/08/21 96/60  02/07/21 116/68  01/21/21 122/74   Pulse Readings from Last 3 Encounters:  02/08/21 74  02/07/21 (!) 58  01/21/21 79   Wt Readings from Last 3 Encounters:  02/08/21 132 lb 12.8 oz (60.2 kg)  02/07/21 135 lb (61.2 kg)  01/25/21 135 lb (61.2 kg)   BMI Readings from Last 3 Encounters:  02/08/21 19.90 kg/m  02/07/21 20.23 kg/m  01/25/21 20.23 kg/m    Assessment: Review of patient past medical history, allergies, medications, health status, including review of consultants reports, laboratory and other test data, was performed as part of comprehensive evaluation and provision of chronic care management services.   SDOH:  (Social Determinants of Health) assessments and interventions performed: Yes   CCM Care Plan  Allergies  Allergen Reactions   Penicillins     REACTION: Yeast Infections    Medications Reviewed Today     Reviewed by Madelin Rear, [facility] (Pharmacist) on 03/27/21 at 1146  Med List Status: <None>   Medication Order Taking? Sig Documenting Provider Last Dose Status Informant  aspirin 81 MG tablet [id number]  Take 81 mg by mouth daily.  [provider]  Active   calcitRIOL (ROCALTROL) 0.5 MCG capsule [id number] Yes Take 2 capsules (1 mcg total) by mouth daily. Renato Shin, MD Taking Active   cholecalciferol (VITAMIN D3) 25 MCG (1000 UNIT) tablet [id number] Yes Take 2 tablets (2,000 Units total) by mouth daily. Renato Shin, MD Taking Active   clotrimazole-betamethasone Donalynn Furlong) cream [id number]  Apply 1 application topically 2 (two) times daily. To foot rash Wendie Agreste, MD  Active     Patient not taking:  Discontinued 03/27/21 1113 (No longer needed  (for PRN medications))   diclofenac Sodium (VOLTAREN) 1 % GEL [id number]  APPLY 2 G TOPICALLY 4 TIMES DAILY Wendie Agreste, MD  Active   EUTHYROX 100 MCG tablet [id number] Yes TAKE 1 TABLET BY MOUTH ONCE DAILY BEFORE BREAKFAST Midge Minium, MD Taking Active   fish oil-omega-3 fatty acids 1000 MG capsule [id number] Yes Take 2 g by mouth daily.  [provider] Taking Active     Discontinued 03/27/21 1120 (No longer needed (for PRN medications))   Multiple Vitamin (MULTIVITAMIN) tablet [id number] Yes Take 1 tablet by mouth daily. [provider] Taking Active  rosuvastatin (CRESTOR) 20 MG tablet [id number] Yes Take 1 tablet by mouth once daily Midge Minium, MD Taking Active   traMADol (ULTRAM) 50 MG tablet [id number]  Take 1 tablet by mouth twice daily as needed Kennyth Arnold, [place]  Active   TURMERIC PO [id number] Yes Take by mouth. [provider] Taking Active   vitamin B-12 (CYANOCOBALAMIN) 1000 MCG tablet [id number] Yes Take 1,000 mcg by mouth daily. [provider] Taking Active             Patient Active Problem List   Diagnosis Date Noted   B12 deficiency 08/27/2020   Primary osteoarthritis of left knee 01/04/2020   Chronic hepatitis C without hepatic coma ([place]) 11/29/2019   Chronic left-sided low back pain without sciatica 11/23/2019   Cervical radiculopathy 11/23/2019   Knee mass, left 11/23/2019   Pre-diabetes 06/27/2019   Numbness 07/21/2018   Status post carpal tunnel release 04/17/2016   Physical exam 05/03/2015   OAB (overactive bladder) 11/02/2014   Post-surgical hypothyroidism 10/10/2014   Visual loss 10/10/2014   Hypotension 10/10/2014   Chronic back pain 09/07/2013   Paresthesia of hand, bilateral 09/07/2013   Recurrent falls 01/17/2013   TOBACCO ABUSE 05/15/2010   GOITER 05/14/2010   Hyperlipidemia 05/14/2010   HYPOCALCEMIA 05/14/2010   HYPOPARATHYROIDISM 03/14/2010    Immunization History  Administered Date(s)  Administered   Tdap 02/19/2017, 02/04/2019, 09/25/2020    Conditions to be addressed/monitored: HLD preDM smoking cessation OA Hypothyroidism hypoparathyroidism   Care Plan : ccm pharmacy care plan  Updates made by Madelin Rear, [facility] since 03/27/2021 12:00 AM     Problem: HLD preDM smoking cessation OA Hypothyroidism hypoparathyroidism   Priority: High     Long-Range Goal: disease management   Start Date: 03/27/2021  Expected End Date: 03/27/2022  This Visit's Progress: On track  Priority: High  Note:    Current Barriers:  Unable to achieve control of smoking cessation    Pharmacist Clinical Goal(s):  Patient will achieve adherence to monitoring guidelines and medication adherence to achieve therapeutic efficacy through collaboration with PharmD and provider.   Interventions: 1:1 collaboration with Midge Minium, MD regarding development and update of comprehensive plan of care as evidenced by provider attestation and co-signature Inter-disciplinary care team collaboration (see longitudinal plan of care) Comprehensive medication review performed; medication list updated in electronic medical record  Hyperlipidemia: (LDL goal < 100) -Controlled -Current treatment: Rosuvastatin 20 mg once daily  -Medications previously tried: atorvastatin  -Side effect review - noted some previous aches, none at present. No recent gaps identified in [place] hx.  -Educated on Cholesterol goals;  Benefits of statin for ASCVD risk reduction; Importance of limiting foods high in cholesterol; -Recommended to continue current medication  Prediabetes (A1c goal <6.5%) -Controlled -Current medications: No current medications  -Medications previously tried: n/a  -Current home glucose readings fasting glucose: n/a post prandial glucose: n/a -Denies hypoglycemic/hyperglycemic symptoms -Current meal patterns: some processed foods, breads noted -Current exercise: goes to [employer] 3x/wk - water  walking -Educated on A1c and blood sugar goals; -Counseled to check feet daily and get yearly eye exams -Recommended to continue current medication  Hypothyroidism (Goal: ensure appropriate use of levothyroxine) -Controlled -Current treatment  Euthyrox 100 mcg once daily  -Reviewed administration - reports consistent use, no problems noted -Medications previously tried: n/a  -Recommended to continue current medication  Smoking cessation (Goal: stop smoking) -Controlled -time to first cig less than 30 minutes -pattern and emotional triggers -open to NRT options -  lozenge  -Current treatment  START Nicotine lozenge 4 mg - Weeks 1 to 6: 1 lozenge every 1 to 2 hours (maximum: 5 lozenges every 6 hours; 20 lozenges/day); to increase chances of quitting, use at least 9 lozenges/day during the first 6 weeks. -Medications previously tried: may have tried oral NRT ~15 yrs ago   -Recommended to continue current medication -Consider DEXA repeat - last 2016, BMI ~20 -Referral placed for Quitline [place]   Patient Goals/Self-Care Activities Patient will:  - take medications as prescribed collaborate with provider on medication access solutions target a minimum of 150 minutes of moderate intensity exercise weekly engage in dietary modifications by reducing carbohydrate intake including bread, pasta, rice, crackers  Medication Assistance: None required.  Patient affirms current coverage meets needs.     Patient's preferred pharmacy is:  [facility] [place] [address] Phone: [phone] Fax: [phone]  [place] [address] Phone: [phone] Fax: [phone]  [facility]) - [place], [address] [place] [address] Phone: [phone] Fax: [phone]   Pt endorses 100% compliance  Follow  Up:  Patient agrees to Care Plan and Follow-up. Plan: HC 1 month fu call on smoking cessation. Pharmacist 3 month f/u telephone visit   Future Appointments  Date Time Provider [place]  03/29/2021  1:30 PM Midge Minium, MD [facility]  06/26/2021  3:00 PM [facility]CM PHARMACIST [facility]    Madelin Rear, PharmD, CPP Clinical Pharmacist Practitioner  [place]  [phone]

## 2021-03-29 ENCOUNTER — Other Ambulatory Visit: Payer: Self-pay

## 2021-03-29 ENCOUNTER — Ambulatory Visit (INDEPENDENT_AMBULATORY_CARE_PROVIDER_SITE_OTHER): Payer: Medicare Other | Admitting: Family Medicine

## 2021-03-29 ENCOUNTER — Encounter: Payer: Self-pay | Admitting: Family Medicine

## 2021-03-29 VITALS — BP 116/72 | HR 94 | Temp 97.3°F | Resp 16 | Ht 68.5 in | Wt 131.6 lb

## 2021-03-29 DIAGNOSIS — Z0000 Encounter for general adult medical examination without abnormal findings: Secondary | ICD-10-CM | POA: Diagnosis not present

## 2021-03-29 NOTE — Progress Notes (Signed)
   Subjective:    Patient ID: Tricia Woodward, female    DOB: Mar 12, 1956, 65 y.o.   MRN: [id number]  HPI CPE- UTD on mammo, colonoscopy.  Declines flu.  Declines PNA.  Pt had labs done in August- reviewed.  Health Maintenance  Topic Date Due   COVID-19 Vaccine (1) 04/14/2021 (Originally 08/14/1956)   Zoster Vaccines- Shingrix (1 of 2) 04/23/2021 (Originally 02/12/1974)   INFLUENZA VACCINE  09/13/2021 (Originally 01/14/2021)   MAMMOGRAM  01/04/2022   COLONOSCOPY (Pts 45-77yrs Insurance coverage will need to be confirmed)  02/07/2026   TETANUS/TDAP  09/26/2030   DEXA SCAN  Completed   Hepatitis C Screening  Completed   HPV VACCINES  Aged Out     Patient Care Team    Relationship Specialty Notifications Start End  Midge Minium, MD PCP - General Family Medicine  09/07/13   Renato Shin, MD Consulting Physician Endocrinology  06/27/19   Madelin Rear, [facility] Pharmacist Pharmacist  01/30/21    Comment: [phone]      Review of Systems Patient reports no vision/ hearing changes, adenopathy,fever, weight change,  persistant/recurrent hoarseness , swallowing issues, chest pain, palpitations, edema, persistant/recurrent cough, hemoptysis, dyspnea (rest/exertional/paroxysmal nocturnal), gastrointestinal bleeding (melena, rectal bleeding), abdominal pain, significant heartburn, bowel changes, GU symptoms (dysuria, hematuria, incontinence), Gyn symptoms (abnormal  bleeding, pain),  syncope, focal weakness, memory loss, numbness & tingling, skin/hair/nail changes, abnormal bruising or bleeding, anxiety, or depression.   This visit occurred during the SARS-CoV-2 public health emergency.  Safety protocols were in place, including screening questions prior to the visit, additional usage of staff PPE, and extensive cleaning of exam room while observing appropriate contact time as indicated for disinfecting solutions.       Objective:   Physical Exam General Appearance:    Alert, cooperative, no  distress, appears stated age  Head:    Normocephalic, without obvious abnormality, atraumatic  Eyes:    PERRL, conjunctiva/corneas clear, EOM's intact, fundi    benign, both eyes  Ears:    Normal TM's and external ear canals, both ears  Nose:   Deferred due to COVID  Throat:   Neck:   Supple, symmetrical, trachea midline, no adenopathy;    Thyroid: no enlargement/tenderness/nodules  Back:     Symmetric, no curvature, ROM normal, no CVA tenderness  Lungs:     Clear to auscultation bilaterally, respirations unlabored  Chest Wall:    No tenderness or deformity   Heart:    Regular rate and rhythm, S1 and S2 normal, no murmur, rub   or gallop  Breast Exam:    Deferred to mammo  Abdomen:     Soft, non-tender, bowel sounds active all four quadrants,    no masses, no organomegaly  Genitalia:    Deferred  Rectal:    Extremities:   Extremities normal, atraumatic, no cyanosis or edema  Pulses:   2+ and symmetric all extremities  Skin:   Skin color, texture, turgor normal, no rashes or lesions  Lymph nodes:   Cervical, supraclavicular, and axillary nodes normal  Neurologic:   CNII-XII intact, normal strength, sensation and reflexes    throughout          Assessment & Plan:

## 2021-03-29 NOTE — Patient Instructions (Signed)
Follow up in 1 year or as needed No need for labs today- yay!!  They looked great in August Keep up the good work on [employer] and regular exercise- you look great! Call with any questions or concerns Stay Safe!  Stay Healthy! Happy Fall!!!

## 2021-03-29 NOTE — Assessment & Plan Note (Signed)
Pt's PE WNL.  UTD on mammo, colonoscopy.  Has a smoking cessation appt this afternoon.  Reviewed recent labs- all WNL and no need to repeat.  Declines flu and PNA vaccines.  Anticipatory guidance provided.

## 2021-04-15 DIAGNOSIS — E890 Postprocedural hypothyroidism: Secondary | ICD-10-CM

## 2021-04-15 DIAGNOSIS — E785 Hyperlipidemia, unspecified: Secondary | ICD-10-CM

## 2021-04-19 ENCOUNTER — Other Ambulatory Visit: Payer: Self-pay

## 2021-04-19 DIAGNOSIS — E785 Hyperlipidemia, unspecified: Secondary | ICD-10-CM

## 2021-04-19 MED ORDER — LEVOTHYROXINE SODIUM 100 MCG PO TABS: 100.0000 ug | ORAL_TABLET | Freq: Every day | ORAL | 0 refills | Status: DC

## 2021-05-08 ENCOUNTER — Telehealth: Payer: Self-pay

## 2021-05-08 NOTE — Telephone Encounter (Signed)
Pt said she wants to leave her RX at [facility]

## 2021-05-08 NOTE — Telephone Encounter (Signed)
Left vm for patient to return my call in regards to refill request for calcitrol and rosuvastatin that we received from [facility]. Needing to confirm that she would like those sent there as they were just sent to a local pharmacy recently.

## 2021-05-14 ENCOUNTER — Other Ambulatory Visit: Payer: Self-pay

## 2021-05-14 DIAGNOSIS — E209 Hypoparathyroidism, unspecified: Secondary | ICD-10-CM

## 2021-05-14 DIAGNOSIS — E785 Hyperlipidemia, unspecified: Secondary | ICD-10-CM

## 2021-05-14 MED ORDER — CALCITRIOL 0.5 MCG PO CAPS: 1.0000 ug | ORAL_CAPSULE | Freq: Every day | ORAL | 11 refills | Status: DC

## 2021-05-14 MED ORDER — ROSUVASTATIN CALCIUM 20 MG PO TABS: 20.0000 mg | ORAL_TABLET | Freq: Every day | ORAL | 0 refills | Status: DC

## 2021-05-15 ENCOUNTER — Other Ambulatory Visit: Payer: Self-pay | Admitting: Family Medicine

## 2021-05-15 DIAGNOSIS — E785 Hyperlipidemia, unspecified: Secondary | ICD-10-CM

## 2021-05-21 ENCOUNTER — Telehealth: Payer: Self-pay

## 2021-05-21 NOTE — Telephone Encounter (Signed)
Caller name:Wendy ( [employer])  On DPR? :Yes  Call back [phone]  Provider they see: Birdie Riddle   Reason for call: Test results done today circulation screening results on the right leg 0.73 Left leg 0.77 mild to moderate decrease lower circulation

## 2021-05-21 NOTE — Telephone Encounter (Signed)
noted 

## 2021-06-12 ENCOUNTER — Telehealth: Payer: Self-pay | Admitting: Family Medicine

## 2021-06-12 ENCOUNTER — Ambulatory Visit: Payer: Medicare Other | Admitting: Family Medicine

## 2021-06-12 NOTE — Telephone Encounter (Signed)
Pt called in asking if the rosuvastatin can be changed to Zetia. Pt has an appt with KT on next Wednesday. I told her she may need to wait to discuss this at her appt.  Please advise

## 2021-06-12 NOTE — Telephone Encounter (Signed)
Patient states that she has been having some body aches and pains since taking the rosuvastatin. She would like it to be changed if possible. She is aware Dr Birdie Riddle is out of the office but I let her know that I would get her approval and see what he advises

## 2021-06-13 NOTE — Telephone Encounter (Signed)
Called patient and advised her of Dr. Virgil Benedict recommendations. Patient verbalized agreement and understanding.

## 2021-06-13 NOTE — Telephone Encounter (Signed)
She can hold her Crestor until her upcoming appt and see if her body aches improve.  If they do, we can consider alternatives.  If not, it is not related to the Crestor.

## 2021-06-19 ENCOUNTER — Ambulatory Visit (INDEPENDENT_AMBULATORY_CARE_PROVIDER_SITE_OTHER): Payer: Commercial Managed Care - HMO | Admitting: Family Medicine

## 2021-06-19 ENCOUNTER — Encounter: Payer: Self-pay | Admitting: Family Medicine

## 2021-06-19 VITALS — BP 102/70 | HR 70 | Temp 97.7°F | Resp 16 | Wt 135.2 lb

## 2021-06-19 DIAGNOSIS — E785 Hyperlipidemia, unspecified: Secondary | ICD-10-CM | POA: Diagnosis not present

## 2021-06-19 DIAGNOSIS — Z789 Other specified health status: Secondary | ICD-10-CM | POA: Diagnosis not present

## 2021-06-19 DIAGNOSIS — I739 Peripheral vascular disease, unspecified: Secondary | ICD-10-CM | POA: Diagnosis not present

## 2021-06-19 MED ORDER — EZETIMIBE 10 MG PO TABS: 10.0000 mg | ORAL_TABLET | Freq: Every day | ORAL | 3 refills | Status: DC

## 2021-06-19 MED ORDER — ASPIRIN 81 MG PO TBEC: 81.0000 mg | DELAYED_RELEASE_TABLET | Freq: Every day | ORAL | 12 refills | Status: AC

## 2021-06-19 NOTE — Assessment & Plan Note (Signed)
New.  Noted on in-home vascular screen via [place].  Both R and L leg were decreased at 0.77 and 0.73.  She is currently asymptomatic and denies claudication.  Will restart ASA daily.  Pt expressed understanding and is in agreement w/ plan.

## 2021-06-19 NOTE — Assessment & Plan Note (Signed)
Ongoing issue for pt.  She reports feeling better since stopping statin.  At this time, will switch to Zetia in hopes of maintaining lipid reduction.  Pt expressed understanding and is in agreement w/ plan.

## 2021-06-19 NOTE — Patient Instructions (Signed)
Follow up in 3 months to recheck cholesterol STOP the Crestor START the Zetia once daily RESTART the baby aspirin (81mg ) Call with any questions or concerns Stay Safe!  Stay Healthy! Happy New Year!!!

## 2021-06-19 NOTE — Assessment & Plan Note (Signed)
New.  Pt states that she feels much better physically- less stiff, less muscle pain- since stopping statin.

## 2021-06-19 NOTE — Progress Notes (Signed)
° °  Subjective:    Patient ID: Tricia Woodward, female    DOB: 19-Jun-1955, 66 y.o.   MRN: [id number]  HPI Hyperlipidemia- chronic problem, pt was taking Crestor daily but reported increased body aches.  She has been holding this for a little over a week.  Pt reports feeling less stiff and sore since stopping medication.    PAD- pt had in-home screening and was found to have decreased [employer] screen in both R and L leg.  R 0.77, L 0.73.  Not currently taking a baby ASA.   Review of Systems For ROS see HPI   This visit occurred during the SARS-CoV-2 public health emergency.  Safety protocols were in place, including screening questions prior to the visit, additional usage of staff PPE, and extensive cleaning of exam room while observing appropriate contact time as indicated for disinfecting solutions.      Objective:   Physical Exam Vitals reviewed.  Constitutional:      General: She is not in acute distress.    Appearance: Normal appearance. She is well-developed.  HENT:     Head: Normocephalic and atraumatic.  Eyes:     Conjunctiva/sclera: Conjunctivae normal.     Pupils: Pupils are equal, round, and reactive to light.  Neck:     Thyroid: No thyromegaly.  Cardiovascular:     Rate and Rhythm: Normal rate and regular rhythm.     Pulses: Normal pulses.     Heart sounds: Normal heart sounds. No murmur heard. Pulmonary:     Effort: Pulmonary effort is normal. No respiratory distress.     Breath sounds: Normal breath sounds.  Abdominal:     General: There is no distension.     Palpations: Abdomen is soft.     Tenderness: There is no abdominal tenderness.  Musculoskeletal:     Cervical back: Normal range of motion and neck supple.     Right lower leg: No edema.     Left lower leg: No edema.  Lymphadenopathy:     Cervical: No cervical adenopathy.  Skin:    General: Skin is warm and dry.  Neurological:     Mental Status: She is alert and oriented to person, place, and time.   Psychiatric:        Behavior: Behavior normal.          Assessment & Plan:

## 2021-06-20 NOTE — Progress Notes (Deleted)
Chronic Care Management Pharmacy Note  06/20/2021 Name:  TAMBER BURTCH MRN:  [id number] DOB:  October 09, 1955  Summary: Current 0.5 ppd smoker - will work together on smoking cessation, referral to [place] placed  Subjective: INESS PANGILINAN is an 66 y.o. year old female who is a primary patient of Tabori, Aundra Millet, MD.  The CCM team was consulted for assistance with disease management and care coordination needs.    Engaged with patient by telephone for initial visit in response to provider referral for pharmacy case management and/or care coordination services.   Consent to Services:  The patient was given the following information about Chronic Care Management services today, agreed to services, and gave verbal consent: 1. CCM service includes personalized support from designated clinical staff supervised by the primary care provider, including individualized plan of care and coordination with other care providers 2. 24/7 contact phone numbers for assistance for urgent and routine care needs. 3. Service will only be billed when office clinical staff spend 20 minutes or more in a month to coordinate care. 4. Only one practitioner may furnish and bill the service in a calendar month. 5.The patient may stop CCM services at any time (effective at the end of the month) by phone call to the office staff. 6. The patient will be responsible for cost sharing (co-pay) of up to 20% of the service fee (after annual deductible is met). Patient agreed to services and consent obtained.  Patient Care Team: Midge Minium, MD as PCP - General (Family Medicine) Renato Shin, MD as Consulting Physician (Endocrinology) Madelin Rear, [facility] as Pharmacist (Pharmacist)  Recent office visits: 06/19/21 Birdie Riddle) - stopped Crestor due to myalgias, started Zetia 36m daily  Hospital visits: None in previous 6 months  Objective:  Lab Results  Component Value Date   CREATININE 0.97 01/21/2021    CREATININE 0.84 08/27/2020   CREATININE 0.90 07/30/2020    Lab Results  Component Value Date   HGBA1C 6.2 01/21/2021   HGBA1C 5.2 09/07/2019   Last diabetic Eye exam: No results found for: HMDIABEYEEXA  Last diabetic Foot exam: No results found for: HMDIABFOOTEX      Component Value Date/Time   CHOL 199 01/21/2021 1452   CHOL 202 (H) 07/30/2020 1142   CHOL 264 (H) 03/07/2020 1126   CHOL 227 (H) 06/27/2019 1156   TRIG 75.0 01/21/2021 1452   TRIG 71.0 07/30/2020 1142   TRIG 93 03/07/2020 1126   HDL 96.20 01/21/2021 1452   HDL 105.80 07/30/2020 1142   HDL 71 03/07/2020 1126   HDL 149 06/27/2019 1156   CHOLHDL 2 01/21/2021 1452   VLDL 15.0 01/21/2021 1452   LDLCALC 87 01/21/2021 1452   LDLCALC 82 07/30/2020 1142   LDLCALC 172 (H) 03/07/2020 1126   LDLCALC 96 01/03/2020 0849   LDLCALC 67 06/27/2019 1156   LDLDIRECT 111.7 10/13/2006 1152   LDLDIRECT 180.4 07/29/2006 1108    Hepatic Function Latest Ref Rng & Units 01/21/2021 07/30/2020 06/20/2020  Total Protein 6.0 - 8.3 g/dL 8.2 8.0 7.5  Albumin 3.5 - 5.2 g/dL 4.6 4.2 -  AST 0 - 37 U/L _0 ALT 0 - 35 U/L _1 Alk Phosphatase 39 - 117 U/L 46 46 -  Total Bilirubin 0.2 - 1.2 mg/dL 0.8 0.8 0.6  Bilirubin, Direct 0.0 - 0.3 mg/dL - 0.2 -    Lab Results  Component Value Date/Time   TSH 0.97 01/21/2021 02:52 PM   TSH 2.10  08/27/2020 11:33 AM   FREET4 1.20 08/27/2020 11:33 AM    CBC Latest Ref Rng & Units 01/21/2021 01/03/2020 11/28/2019  WBC 4.0 - 10.5 K/uL 5.1 5.1 8.9  Hemoglobin 12.0 - 15.0 g/dL 13.4 13.3 12.0  Hematocrit 36.0 - 46.0 % 40.8 39.9 35.9  Platelets 150.0 - 400.0 K/uL 290.0 273.0 280    Lab Results  Component Value Date/Time   VD25OH 57.79 08/27/2020 11:33 AM   VD25OH 54.53 03/28/2020 11:16 AM    Clinical ASCVD:  The 10-year ASCVD risk score (Arnett DK, et al., 2019) is: 9.1%   Values used to calculate the score:     Age: 86 years     Sex: Female     Is Non-Hispanic African American: Yes      Diabetic: No     Tobacco smoker: Yes     Systolic Blood Pressure: 628 mmHg     Is BP treated: No     HDL Cholesterol: 96.2 mg/dL     Total Cholesterol: 199 mg/dL   Social History   Tobacco Use  Smoking Status Heavy Smoker   Packs/day: 0.20   Types: Cigarettes   Start date: 12/16/1978  Smokeless Tobacco Never  Tobacco Comments   e cigs at times   BP Readings from Last 3 Encounters:  06/19/21 102/70  03/29/21 116/72  02/08/21 96/60   Pulse Readings from Last 3 Encounters:  06/19/21 70  03/29/21 94  02/08/21 74   Wt Readings from Last 3 Encounters:  06/19/21 135 lb 3.2 oz (61.3 kg)  03/29/21 131 lb 10.1 oz (59.7 kg)  02/08/21 132 lb 12.8 oz (60.2 kg)   BMI Readings from Last 3 Encounters:  06/19/21 20.26 kg/m  03/29/21 19.72 kg/m  02/08/21 19.90 kg/m    Assessment: Review of patient past medical history, allergies, medications, health status, including review of consultants reports, laboratory and other test data, was performed as part of comprehensive evaluation and provision of chronic care management services.   SDOH:  (Social Determinants of Health) assessments and interventions performed: Yes   CCM Care Plan  Allergies  Allergen Reactions   Penicillins     REACTION: Yeast Infections    Medications Reviewed Today     Reviewed by Midge Minium, MD (Physician) on 06/19/21 at 1444  Med List Status: <None>   Medication Order Taking? Sig Documenting Provider Last Dose Status Informant  calcitRIOL (ROCALTROL) 0.5 MCG capsule [id number] Yes Take 2 capsules (1 mcg total) by mouth daily. Midge Minium, MD Taking Active   cholecalciferol (VITAMIN D3) 25 MCG (1000 UNIT) tablet [id number] Yes Take 2 tablets (2,000 Units total) by mouth daily. Renato Shin, MD Taking Active   clotrimazole-betamethasone Donalynn Furlong) cream [id number] Yes Apply 1 application topically 2 (two) times daily. To foot rash Wendie Agreste, MD Taking Active   diclofenac Sodium  (VOLTAREN) 1 % GEL [id number] Yes APPLY 2 G TOPICALLY 4 TIMES DAILY Wendie Agreste, MD Taking Active   fish oil-omega-3 fatty acids 1000 MG capsule [id number] Yes Take 2 g by mouth daily.  [provider] Taking Active   levothyroxine (EUTHYROX) 100 MCG tablet [id number] Yes Take 1 tablet (100 mcg total) by mouth daily before breakfast. Midge Minium, MD Taking Active   Multiple Vitamin (MULTIVITAMIN) tablet [id number] Yes Take 1 tablet by mouth daily. [provider] Taking Active   rosuvastatin (CRESTOR) 20 MG tablet [id number] No Take 1 tablet (20 mg total) by mouth daily.  Patient not taking: Reported on 06/19/2021  Tabori, Katherine E, MD Not Taking Active   °traMADol (ULTRAM) 50 MG tablet [id number] Yes Take 1 tablet by mouth twice daily as needed Webb, Padonda B, FNP Taking Active   °TURMERIC PO [id number] Yes Take by mouth. [provider] Taking Active   °vitamin B-12 (CYANOCOBALAMIN) 1000 MCG tablet [id number] Yes Take 1,000 mcg by mouth daily. [provider] Taking Active   ° °  °  ° °  ° ° °Patient Active Problem List  ° Diagnosis Date Noted  ° PAD (peripheral artery disease) (HCC) 06/19/2021  ° Statin intolerance 06/19/2021  ° B12 deficiency 08/27/2020  ° Primary osteoarthritis of left knee 01/04/2020  ° Chronic hepatitis C without hepatic coma (HCC) 11/29/2019  ° Chronic left-sided low back pain without sciatica 11/23/2019  ° Cervical radiculopathy 11/23/2019  ° Knee mass, left 11/23/2019  ° Pre-diabetes 06/27/2019  ° Numbness 07/21/2018  ° Status post carpal tunnel release 04/17/2016  ° Physical exam 05/03/2015  ° OAB (overactive bladder) 11/02/2014  ° Post-surgical hypothyroidism 10/10/2014  ° Visual loss 10/10/2014  ° Hypotension 10/10/2014  ° Chronic back pain 09/07/2013  ° Paresthesia of hand, bilateral 09/07/2013  ° Recurrent falls 01/17/2013  ° TOBACCO ABUSE 05/15/2010  ° GOITER 05/14/2010  ° Hyperlipidemia 05/14/2010  ° HYPOCALCEMIA 05/14/2010  °  HYPOPARATHYROIDISM 03/14/2010  ° ° °Immunization History  °Administered Date(s) Administered  ° Tdap 02/19/2017, 02/04/2019, 09/25/2020  ° ° °Conditions to be addressed/monitored: °HLD preDM smoking cessation OA Hypothyroidism hypoparathyroidism  ° °There are no care plans that you recently modified to display for this patient. ° ° ° °Patient's preferred pharmacy is: ° °[facility] - [place], [place] - [address] °[address] °[place] [address] °Phone: [phone] Fax: [phone] ° ° ° °Pt endorses 100% compliance ° °Follow Up:  Patient agrees to Care Plan and Follow-up. °Plan: HC 1 month fu call on smoking cessation. Pharmacist 3 month f/u telephone visit  ° °Future Appointments  °Date Time Provider Department Center  °06/26/2021  3:00 PM [facility] CCM PHARMACIST [facility]  °09/18/2021  1:00 PM Tabori, Katherine E, MD [facility]  ° ° °Christian Davis, PharmD °Clinical Pharmacist  °[place] °[phone] ° ° °Current Barriers:  °Unable to achieve control of smoking cessation   ° °Pharmacist Clinical Goal(s):  °Patient will achieve adherence to monitoring guidelines and medication adherence to achieve therapeutic efficacy through collaboration with PharmD and provider.  ° °Interventions: °1:1 collaboration with Tabori, Katherine E, MD regarding development and update of comprehensive plan of care as evidenced by provider attestation and co-signature °Inter-disciplinary care team collaboration (see longitudinal plan of care) °Comprehensive medication review performed; medication list updated in electronic medical record ° °Hyperlipidemia: (LDL goal < 100) °-Controlled °-Current treatment: °Rosuvastatin 20 mg once daily  °-Medications previously tried: atorvastatin  °-Side effect review - noted some previous aches, none at present. No recent gaps identified in [place] hx.  °-Educated on Cholesterol goals;  °Benefits of statin for ASCVD risk reduction; °Importance  of limiting foods high in cholesterol; °-Recommended to continue current medication ° °Prediabetes (A1c goal <6.5%) °-Controlled °-Current medications: °No current medications  °-Medications previously tried: n/a  °-Current home glucose readings °fasting glucose: n/a °post prandial glucose: n/a °-Denies hypoglycemic/hyperglycemic symptoms °-Current meal patterns: some processed foods, breads noted °-Current exercise: goes to [employer] 3x/wk - water walking °-Educated on A1c and blood sugar goals; °-Counseled to check feet daily and get yearly eye exams °-Recommended to continue current medication ° °Hypothyroidism (  Goal: ensure appropriate use of levothyroxine) °-Controlled °-Current treatment  °Euthyrox 100 mcg once daily  °-Reviewed administration - reports consistent use, no problems noted °-Medications previously tried: n/a  °-Recommended to continue current medication ° °Smoking cessation (Goal: stop smoking) °-Controlled °-time to first cig less than 30 minutes °-pattern and emotional triggers °-open to NRT options - lozenge  °-Current treatment  °START Nicotine lozenge 4 mg - Weeks 1 to 6: 1 lozenge every 1 to 2 hours (maximum: 5 lozenges every 6 hours; 20 lozenges/day); to increase chances of quitting, use at least 9 lozenges/day during the first 6 weeks. °-Medications previously tried: may have tried oral NRT ~15 yrs ago   °-Recommended to continue current medication °-Consider DEXA repeat - last 2016, BMI ~20 °-Referral placed for Quitline [place] ° ° °Patient Goals/Self-Care Activities °Patient will:  °- take medications as prescribed °collaborate with provider on medication access solutions °target a minimum of 150 minutes of moderate intensity exercise weekly °engage in dietary modifications by reducing carbohydrate intake including bread, pasta, rice, crackers ° °Medication Assistance: None required.  Patient affirms current coverage meets needs. °  ° ° ° ° ° ° ° ° °

## 2021-06-25 ENCOUNTER — Encounter: Payer: Self-pay | Admitting: Family Medicine

## 2021-06-26 ENCOUNTER — Telehealth: Payer: Medicare Other

## 2021-07-10 ENCOUNTER — Telehealth: Payer: Medicaid Other

## 2021-07-15 ENCOUNTER — Telehealth: Payer: Self-pay | Admitting: Family Medicine

## 2021-07-15 DIAGNOSIS — H905 Unspecified sensorineural hearing loss: Secondary | ICD-10-CM | POA: Diagnosis not present

## 2021-07-15 NOTE — Telephone Encounter (Signed)
Left message for patient to call back and schedule Medicare Annual Wellness Visit (AWV) in office.  ° °If not able to come in office, please offer to do virtually or by telephone.  Left office number and my jabber #[phone]. ° °Due for AWVI ° °Please schedule at anytime with Nurse Health Advisor. °  °

## 2021-07-16 ENCOUNTER — Other Ambulatory Visit: Payer: Self-pay

## 2021-07-16 ENCOUNTER — Telehealth: Payer: Self-pay

## 2021-07-16 ENCOUNTER — Other Ambulatory Visit: Payer: Self-pay | Admitting: Family Medicine

## 2021-07-16 DIAGNOSIS — R21 Rash and other nonspecific skin eruption: Secondary | ICD-10-CM

## 2021-07-16 MED ORDER — CLOTRIMAZOLE-BETAMETHASONE 1-0.05 % EX CREA: 1.0000 "application " | TOPICAL_CREAM | Freq: Two times a day (BID) | CUTANEOUS | 0 refills | Status: DC

## 2021-07-16 NOTE — Telephone Encounter (Signed)
Sent cream  LM informing pt

## 2021-07-16 NOTE — Telephone Encounter (Signed)
Caller name:Varnika Earnestine Leys callback [phone]  Encourage patient to contact the pharmacy for refills or they can request refills through [facility]  (Please schedule appointment if patient has not been seen in over a year)  [place]) cream   Notes/Comments from patient:  [place] TO: [place]  [place], [place] [address]   Please notify patient: It takes 48-72 hours to process rx refill requests Ask patient to call pharmacy to ensure rx is ready before heading there.   (CLINICAL TO FILL OR ROUTE PER PROTOCOLS)

## 2021-07-22 NOTE — Progress Notes (Signed)
Chronic Care Management Pharmacy Note  07/31/2021 Name:  Tricia Woodward MRN:  [id number] DOB:  August 24, 1955  Summary: Tricia Woodward to start lozenges - still currently smoking 1/2 ppd. Recheck lipids - need to determine efficacy of Zetia  Subjective: Tricia Woodward is an 66 y.o. year old female who is a primary patient of Tabori, Aundra Millet, MD.  The CCM team was consulted for assistance with disease management and care coordination needs.    Engaged with patient by telephone for initial visit in response to provider referral for pharmacy case management and/or care coordination services.   Consent to Services:  The patient was given the following information about Chronic Care Management services today, agreed to services, and gave verbal consent: 1. CCM service includes personalized support from designated clinical staff supervised by the primary care provider, including individualized plan of care and coordination with other care providers 2. 24/7 contact phone numbers for assistance for urgent and routine care needs. 3. Service will only be billed when office clinical staff spend 20 minutes or more in a month to coordinate care. 4. Only one practitioner may furnish and bill the service in a calendar month. 5.The patient may stop CCM services at any time (effective at the end of the month) by phone call to the office staff. 6. The patient will be responsible for cost sharing (co-pay) of up to 20% of the service fee (after annual deductible is met). Patient agreed to services and consent obtained.  Patient Care Team: Midge Minium, MD as PCP - General (Family Medicine) Renato Shin, MD as Consulting Physician (Endocrinology) Edythe Clarity, [facility] (Pharmacist)  Hospital visits: None in previous 6 months  Objective:  Lab Results  Component Value Date   CREATININE 0.97 01/21/2021   CREATININE 0.84 08/27/2020   CREATININE 0.90 07/30/2020    Lab Results  Component Value Date    HGBA1C 6.2 01/21/2021   HGBA1C 5.2 09/07/2019   Last diabetic Eye exam: No results found for: HMDIABEYEEXA  Last diabetic Foot exam: No results found for: HMDIABFOOTEX      Component Value Date/Time   CHOL 199 01/21/2021 1452   CHOL 202 (H) 07/30/2020 1142   CHOL 264 (H) 03/07/2020 1126   CHOL 227 (H) 06/27/2019 1156   TRIG 75.0 01/21/2021 1452   TRIG 71.0 07/30/2020 1142   TRIG 93 03/07/2020 1126   HDL 96.20 01/21/2021 1452   HDL 105.80 07/30/2020 1142   HDL 71 03/07/2020 1126   HDL 149 06/27/2019 1156   CHOLHDL 2 01/21/2021 1452   VLDL 15.0 01/21/2021 1452   LDLCALC 87 01/21/2021 1452   LDLCALC 82 07/30/2020 1142   LDLCALC 172 (H) 03/07/2020 1126   LDLCALC 96 01/03/2020 0849   LDLCALC 67 06/27/2019 1156   LDLDIRECT 111.7 10/13/2006 1152   LDLDIRECT 180.4 07/29/2006 1108    Hepatic Function Latest Ref Rng & Units 01/21/2021 07/30/2020 06/20/2020  Total Protein 6.0 - 8.3 g/dL 8.2 8.0 7.5  Albumin 3.5 - 5.2 g/dL 4.6 4.2 -  AST 0 - 37 U/L _0 ALT 0 - 35 U/L _1 Alk Phosphatase 39 - 117 U/L 46 46 -  Total Bilirubin 0.2 - 1.2 mg/dL 0.8 0.8 0.6  Bilirubin, Direct 0.0 - 0.3 mg/dL - 0.2 -    Lab Results  Component Value Date/Time   TSH 0.97 01/21/2021 02:52 PM   TSH 2.10 08/27/2020 11:33 AM   FREET4 1.20 08/27/2020 11:33 AM    CBC  Latest Ref Rng & Units 01/21/2021 01/03/2020 11/28/2019  WBC 4.0 - 10.5 K/uL 5.1 5.1 8.9  Hemoglobin 12.0 - 15.0 g/dL 13.4 13.3 12.0  Hematocrit 36.0 - 46.0 % 40.8 39.9 35.9  Platelets 150.0 - 400.0 K/uL 290.0 273.0 280    Lab Results  Component Value Date/Time   VD25OH 57.79 08/27/2020 11:33 AM   VD25OH 54.53 03/28/2020 11:16 AM    Clinical ASCVD:  The 10-year ASCVD risk score (Arnett DK, et al., 2019) is: 9.1%   Values used to calculate the score:     Age: 65 years     Sex: Female     Is Non-Hispanic African American: Yes     Diabetic: No     Tobacco smoker: Yes     Systolic Blood Pressure: 431 mmHg     Is BP treated: No      HDL Cholesterol: 96.2 mg/dL     Total Cholesterol: 199 mg/dL   Social History   Tobacco Use  Smoking Status Heavy Smoker   Packs/day: 0.20   Types: Cigarettes   Start date: 12/16/1978  Smokeless Tobacco Never  Tobacco Comments   e cigs at times   BP Readings from Last 3 Encounters:  06/19/21 102/70  03/29/21 116/72  02/08/21 96/60   Pulse Readings from Last 3 Encounters:  06/19/21 70  03/29/21 94  02/08/21 74   Wt Readings from Last 3 Encounters:  06/19/21 135 lb 3.2 oz (61.3 kg)  03/29/21 131 lb 10.1 oz (59.7 kg)  02/08/21 132 lb 12.8 oz (60.2 kg)   BMI Readings from Last 3 Encounters:  06/19/21 20.26 kg/m  03/29/21 19.72 kg/m  02/08/21 19.90 kg/m    Assessment: Review of patient past medical history, allergies, medications, health status, including review of consultants reports, laboratory and other test data, was performed as part of comprehensive evaluation and provision of chronic care management services.   SDOH:  (Social Determinants of Health) assessments and interventions performed: Yes   CCM Care Plan  Allergies  Allergen Reactions   Penicillins     REACTION: Yeast Infections    Medications Reviewed Today     Reviewed by Edythe Clarity, [facility] (Pharmacist) on 07/31/21 at [place] List Status: <None>   Medication Order Taking? Sig Documenting Provider Last Dose Status Informant  aspirin 81 MG EC tablet [id number] Yes Take 1 tablet (81 mg total) by mouth daily. Swallow whole. Midge Minium, MD Taking Active   calcitRIOL (ROCALTROL) 0.5 MCG capsule [id number] Yes Take 2 capsules (1 mcg total) by mouth daily. Midge Minium, MD Taking Active   cholecalciferol (VITAMIN D3) 25 MCG (1000 UNIT) tablet [id number] Yes Take 2 tablets (2,000 Units total) by mouth daily. Renato Shin, MD Taking Active   clotrimazole-betamethasone Donalynn Furlong) cream [id number] Yes Apply 1 application topically 2 (two) times daily. To foot rash Midge Minium, MD  Taking Active   diclofenac Sodium (VOLTAREN) 1 % GEL [id number] Yes APPLY 2 G TOPICALLY 4 TIMES DAILY Wendie Agreste, MD Taking Active   ezetimibe (ZETIA) 10 MG tablet [id number] Yes Take 1 tablet (10 mg total) by mouth daily. Midge Minium, MD Taking Active   fish oil-omega-3 fatty acids 1000 MG capsule [id number] Yes Take 2 g by mouth daily.  [provider] Taking Active   levothyroxine (EUTHYROX) 100 MCG tablet [id number] Yes Take 1 tablet (100 mcg total) by mouth daily before breakfast. Midge Minium, MD Taking Active   Multiple Vitamin (MULTIVITAMIN) tablet [id number] Yes  Take 1 tablet by mouth daily. [provider] Taking Active   traMADol (ULTRAM) 50 MG tablet [id number] Yes Take 1 tablet by mouth twice daily as needed Kennyth Arnold, FNP Taking Active   TURMERIC PO [id number] Yes Take by mouth. [provider] Taking Active   vitamin B-12 (CYANOCOBALAMIN) 1000 MCG tablet [id number] Yes Take 1,000 mcg by mouth daily. [provider] Taking Active             Patient Active Problem List   Diagnosis Date Noted   PAD (peripheral artery disease) ([place]) 06/19/2021   Statin intolerance 06/19/2021   B12 deficiency 08/27/2020   Primary osteoarthritis of left knee 01/04/2020   Chronic hepatitis C without hepatic coma ([place]) 11/29/2019   Chronic left-sided low back pain without sciatica 11/23/2019   Cervical radiculopathy 11/23/2019   Knee mass, left 11/23/2019   Pre-diabetes 06/27/2019   Numbness 07/21/2018   Status post carpal tunnel release 04/17/2016   Physical exam 05/03/2015   OAB (overactive bladder) 11/02/2014   Post-surgical hypothyroidism 10/10/2014   Visual loss 10/10/2014   Hypotension 10/10/2014   Chronic back pain 09/07/2013   Paresthesia of hand, bilateral 09/07/2013   Recurrent falls 01/17/2013   TOBACCO ABUSE 05/15/2010   GOITER 05/14/2010   Hyperlipidemia 05/14/2010   HYPOCALCEMIA 05/14/2010   HYPOPARATHYROIDISM  03/14/2010    Immunization History  Administered Date(s) Administered   Tdap 02/19/2017, 02/04/2019, 09/25/2020    Conditions to be addressed/monitored: HLD preDM smoking cessation OA Hypothyroidism hypoparathyroidism   Care Plan : ccm pharmacy care plan  Updates made by Edythe Clarity, RPH since 07/31/2021 12:00 AM     Problem: HLD preDM smoking cessation OA Hypothyroidism hypoparathyroidism   Priority: High     Long-Range Goal: disease management   Start Date: 03/27/2021  Expected End Date: 03/27/2022  Recent Progress: On track  Priority: High  Note:    Current Barriers:  Unable to achieve control of smoking cessation    Pharmacist Clinical Goal(s):  Patient will achieve adherence to monitoring guidelines and medication adherence to achieve therapeutic efficacy through collaboration with PharmD and provider.   Interventions: 1:1 collaboration with Midge Minium, MD regarding development and update of comprehensive plan of care as evidenced by provider attestation and co-signature Inter-disciplinary care team collaboration (see longitudinal plan of care) Comprehensive medication review performed; medication list updated in electronic medical record  Hyperlipidemia: (LDL goal < 100) -Controlled -Current treatment: Zetia 58m daily Appropriate, Query effective, -Medications previously tried: atorvastatin  -Side effect review - noted some previous aches, none at present. No recent gaps identified in [place] hx.  -Educated on Cholesterol goals;  Benefits of statin for ASCVD risk reduction; Importance of limiting foods high in cholesterol; -Recommended to continue current medication  Update 07/31/21 She switched from Crestor to Zetia due to body aches. She has not had lipid panel since this switch.  Previous lipid panels while she was on Crestor were adequately controlled. She is still active [facility] [employer] Has upcoming visit with PCP in April - recommend  recheck lipids at this time. If increased - could consider resuming statin only a few times per week if she is agreeable. No changes until results from lipid panel.  Prediabetes (A1c goal <6.5%) -Controlled -Current medications: No current medications  -Medications previously tried: n/a  -Current home glucose readings fasting glucose: n/a post prandial glucose: n/a -Denies hypoglycemic/hyperglycemic symptoms -Current meal patterns: some processed foods, breads noted -[facility] -Educated on A1c and blood sugar goals; -Counseled to check feet daily and get yearly eye exams -Recommended to continue current medication  Hypothyroidism (Goal: ensure appropriate use of levothyroxine) -Controlled -Current treatment  Euthyrox 100 mcg once daily  -Reviewed administration - reports consistent use, no problems noted -Medications previously tried: n/a  -Recommended to continue current medication  Smoking cessation (Goal: stop smoking) -Controlled -time to first cig less than 30 minutes -pattern and emotional triggers -open to NRT options - lozenge  -Current treatment  START Nicotine lozenge 4 mg - Weeks 1 to 6: 1 lozenge every 1 to 2 hours (maximum: 5 lozenges every 6 hours; 20 lozenges/day); to increase chances of quitting, use at least 9 lozenges/day during the first 6 weeks. Appropriate, Query effective,,  -Medications previously tried: may have tried oral NRT ~15 yrs ago   -Recommended to continue current medication -Consider DEXA repeat - last 2016, BMI ~20 -Referral placed for Quitline [place]  Update 07/31/21 She is still reporting smoking about 1/2 ppd.  She did not start lozenges. After discussion today she has agreed to give the lozenges a try.  We also discussed using a jar for money to put in whatever she would typically spend on cigarettes.  This way she can visually see the money she would save by quitting. Will continue to pursue  smoking cessation. CMA to follow up next month to determine if having success.  Patient Goals/Self-Care Activities Patient will:  - take medications as prescribed collaborate with provider on medication access solutions target a minimum of 150 minutes of moderate intensity exercise weekly engage in dietary modifications by reducing carbohydrate intake including bread, pasta, rice, crackers  Medication Assistance: None required.  Patient affirms current coverage meets needs.         Patient's preferred pharmacy is:  [facility] [address] [place] [address] Phone: [phone] Fax: [phone]    Pt endorses 100% compliance  Follow Up:  Patient agrees to Care Plan and Follow-up. Plan: HC 1 month fu call on smoking cessation. Pharmacist 3 month f/u telephone visit   Future Appointments  Date Time Provider [place]  09/18/2021  1:00 PM Midge Minium, MD [facility] [place], PharmD Clinical Pharmacist  [facility] [phone]

## 2021-07-31 ENCOUNTER — Ambulatory Visit (INDEPENDENT_AMBULATORY_CARE_PROVIDER_SITE_OTHER): Payer: Medicare Other | Admitting: Pharmacist

## 2021-07-31 DIAGNOSIS — F17200 Nicotine dependence, unspecified, uncomplicated: Secondary | ICD-10-CM

## 2021-07-31 DIAGNOSIS — E785 Hyperlipidemia, unspecified: Secondary | ICD-10-CM

## 2021-07-31 NOTE — Patient Instructions (Addendum)
Visit Information   Goals Addressed             This Visit's Progress    Maintain LDL < 100       Timeframe:  Long-Range Goal Priority:  High Start Date: 07/31/21                            Expected End Date: 01/28/22                      Follow Up Date 10/28/21    LDL < 100 on Zetia   Why is this important?   These steps will help you keep on track with your medicines.   Notes:        Patient Care Plan: ccm pharmacy care plan     Problem Identified: HLD preDM smoking cessation OA Hypothyroidism hypoparathyroidism   Priority: High     Long-Range Goal: disease management   Start Date: 03/27/2021  Expected End Date: 03/27/2022  Recent Progress: On track  Priority: High  Note:    Current Barriers:  Unable to achieve control of smoking cessation    Pharmacist Clinical Goal(s):  Patient will achieve adherence to monitoring guidelines and medication adherence to achieve therapeutic efficacy through collaboration with PharmD and provider.   Interventions: 1:1 collaboration with Midge Minium, MD regarding development and update of comprehensive plan of care as evidenced by provider attestation and co-signature Inter-disciplinary care team collaboration (see longitudinal plan of care) Comprehensive medication review performed; medication list updated in electronic medical record  Hyperlipidemia: (LDL goal < 100) -Controlled -Current treatment: Zetia 10mg  daily Appropriate, Query effective, -Medications previously tried: atorvastatin  -Side effect review - noted some previous aches, none at present. No recent gaps identified in [place] hx.  -Educated on Cholesterol goals;  Benefits of statin for ASCVD risk reduction; Importance of limiting foods high in cholesterol; -Recommended to continue current medication  Update 07/31/21 She switched from Crestor to Zetia due to body aches. She has not had lipid panel since this switch.  Previous lipid panels while she  was on Crestor were adequately controlled. She is still active working out at [employer]. Has upcoming visit with PCP in April - recommend recheck lipids at this time. If increased - could consider resuming statin only a few times per week if she is agreeable. No changes until results from lipid panel.  Prediabetes (A1c goal <6.5%) -Controlled -Current medications: No current medications  -Medications previously tried: n/a  -Current home glucose readings fasting glucose: n/a post prandial glucose: n/a -Denies hypoglycemic/hyperglycemic symptoms -Current meal patterns: some processed foods, breads noted -Current exercise: goes to [employer] 3x/wk - water walking -Educated on A1c and blood sugar goals; -Counseled to check feet daily and get yearly eye exams -Recommended to continue current medication  Hypothyroidism (Goal: ensure appropriate use of levothyroxine) -Controlled -Current treatment  Euthyrox 100 mcg once daily  -Reviewed administration - reports consistent use, no problems noted -Medications previously tried: n/a  -Recommended to continue current medication  Smoking cessation (Goal: stop smoking) -Controlled -time to first cig less than 30 minutes -pattern and emotional triggers -open to NRT options - lozenge  -Current treatment  START Nicotine lozenge 4 mg - Weeks 1 to 6: 1 lozenge every 1 to 2 hours (maximum: 5 lozenges every 6 hours; 20 lozenges/day); to increase chances of quitting, use at least 9 lozenges/day during the first 6 weeks. Appropriate, Query effective,,  -Medications  previously tried: may have tried oral NRT ~15 yrs ago   -Recommended to continue current medication -Consider DEXA repeat - last 2016, BMI ~20 -Referral placed for Quitline [place]  Update 07/31/21 She is still reporting smoking about 1/2 ppd.  She did not start lozenges. After discussion today she has agreed to give the lozenges a try.  We also discussed using a jar for money to put in whatever she  would typically spend on cigarettes.  This way she can visually see the money she would save by quitting. Will continue to pursue smoking cessation. CMA to follow up next month to determine if having success.  Patient Goals/Self-Care Activities Patient will:  - take medications as prescribed collaborate with provider on medication access solutions target a minimum of 150 minutes of moderate intensity exercise weekly engage in dietary modifications by reducing carbohydrate intake including bread, pasta, rice, crackers  Medication Assistance: None required.  Patient affirms current coverage meets needs.         Patient verbalizes understanding of instructions and care plan provided today and agrees to view in [place]. Active MyChart status confirmed with patient.   Telephone follow up appointment with pharmacy team member scheduled for: 6 months  Edythe Clarity, [place], PharmD Clinical Pharmacist  [facility] [phone]

## 2021-08-13 ENCOUNTER — Other Ambulatory Visit: Payer: Self-pay | Admitting: Family Medicine

## 2021-08-13 DIAGNOSIS — E039 Hypothyroidism, unspecified: Secondary | ICD-10-CM | POA: Diagnosis not present

## 2021-08-13 DIAGNOSIS — E785 Hyperlipidemia, unspecified: Secondary | ICD-10-CM

## 2021-08-20 ENCOUNTER — Telehealth: Payer: Self-pay | Admitting: Pharmacist

## 2021-08-20 NOTE — Progress Notes (Signed)
? ? ?  Chronic Care Management ?Pharmacy Assistant  ? ?Name: Tricia Woodward  MRN: [id number] DOB: 1956-02-25 ? ?Reason for Encounter: Disease State - General Adherence Call  ?  ? ?Recent office visits:  ?None noted.  ? ?Recent consult visits:  ?None noted.  ? ?Hospital visits:  ?None in previous 6 months ? ?Medications: ?Outpatient Encounter Medications as of 08/20/2021  ?Medication Sig  ? aspirin 81 MG EC tablet Take 1 tablet (81 mg total) by mouth daily. Swallow whole.  ? calcitRIOL (ROCALTROL) 0.5 MCG capsule Take 2 capsules (1 mcg total) by mouth daily.  ? cholecalciferol (VITAMIN D3) 25 MCG (1000 UNIT) tablet Take 2 tablets (2,000 Units total) by mouth daily.  ? clotrimazole-betamethasone (LOTRISONE) cream Apply 1 application topically 2 (two) times daily. To foot rash  ? diclofenac Sodium (VOLTAREN) 1 % GEL APPLY 2 G TOPICALLY 4 TIMES DAILY  ? ezetimibe (ZETIA) 10 MG tablet Take 1 tablet (10 mg total) by mouth daily.  ? fish oil-omega-3 fatty acids 1000 MG capsule Take 2 g by mouth daily.   ? levothyroxine (EUTHYROX) 100 MCG tablet Take 1 tablet (100 mcg total) by mouth daily before breakfast.  ? Multiple Vitamin (MULTIVITAMIN) tablet Take 1 tablet by mouth daily.  ? traMADol (ULTRAM) 50 MG tablet Take 1 tablet by mouth twice daily as needed  ? TURMERIC PO Take by mouth.  ? vitamin B-12 (CYANOCOBALAMIN) 1000 MCG tablet Take 1,000 mcg by mouth daily.  ? ?No facility-administered encounter medications on file as of 08/20/2021.  ? ? ?Have you had any problems recently with your health? ? ? ?Have you had any problems with your pharmacy? ? ? ?What issues or side effects are you having with your medications? ? ? ?What would you like me to pass along to Leata Mouse, CPP for them to help you with?  ? ? ?What can we do to take care of you better? ? ? ?Care Gaps ? ?AWV: done 07/15/21 ?Colonoscopy: due 02/07/26 ?DM Eye Exam: N/A ?DM Foot Exam: N/A ?Microalbumin: unknown ?HbgAIC: done 01/21/21 (6.2) ?DEXA: done  05/15/15 ?Mammogram: done 01/04/21 ? ? ?Star Rating Drugs: ?No Star Rating Drugs Noted. ? ? ?Future Appointments  ?Date Time Provider [place]  ?09/18/2021  1:00 PM Midge Minium, MD [facility]  ?01/29/2022  2:15 PM [facility] C[facility] PHARMACIST [facility]  ? ?Multiple attempts were made to contact patient. Attempts were unsuccessful. / ls,CMA  ? ?Liza Showfety, CCMA ?Clinical Pharmacist Assistant  ?[phone] ? ?

## 2021-09-15 IMAGING — MR MR KNEE*L* W/O CM
4 of 6 series · 22 of 40 positions shown · non-contrast
Comparison: Radiograph 12/23/2019

CLINICAL DATA: Lateral knee mass for 2 months.

EXAM:
MRI OF THE LEFT KNEE WITHOUT CONTRAST
TECHNIQUE: Multiplanar, multisequence MR imaging of the knee was performed. No
intravenous contrast was administered.

[Series 3: T1 · axial · 4.0mm · 0.31mm/px · z∈[-76,+59]mm · 6 of 34 slices shown]
[im 1/34]
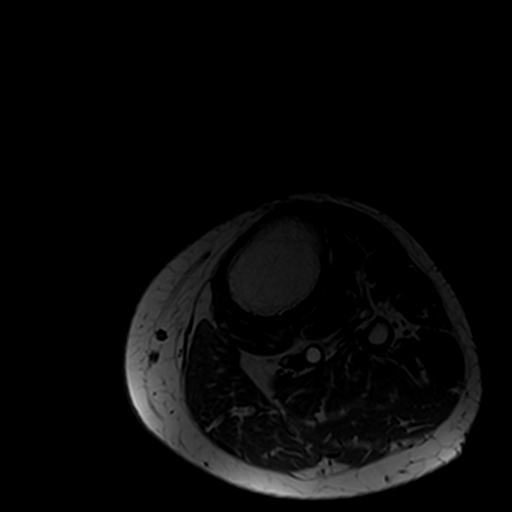
[im 6/34]
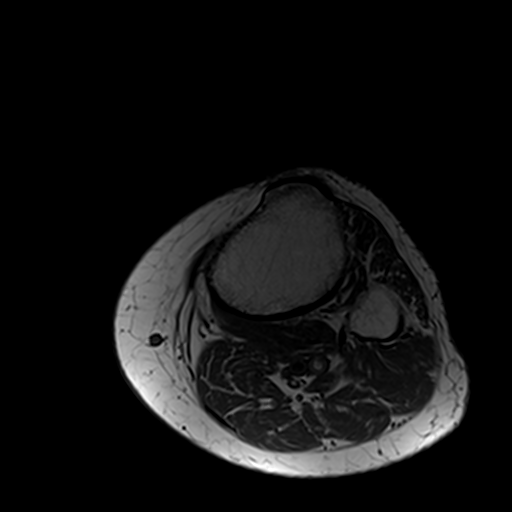
[im 12/34]
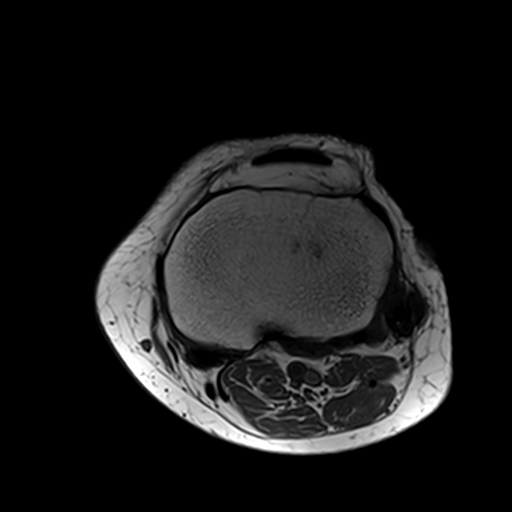
[im 17/34]
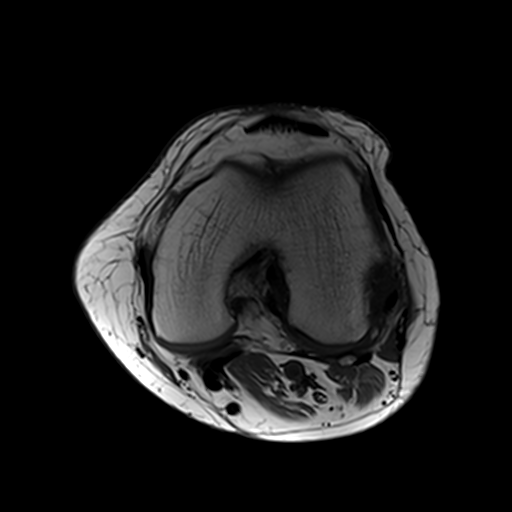
[im 23/34]
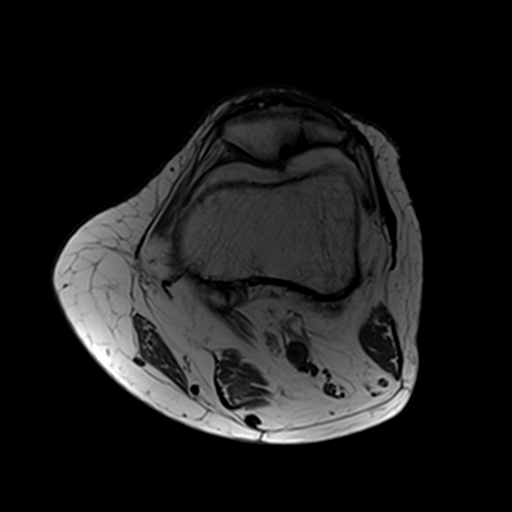
[im 28/34]
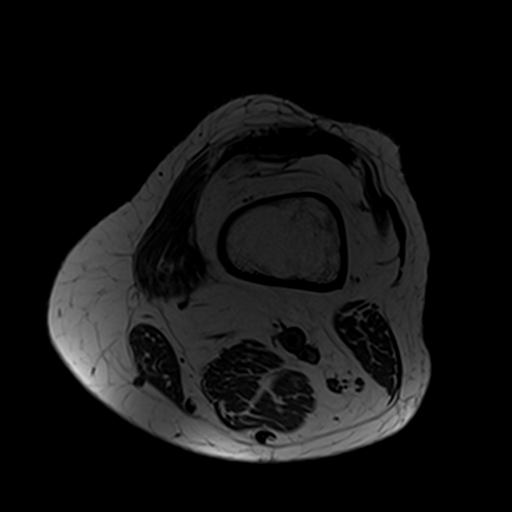

[Series 4: T2 fat-sat · axial · 4.0mm · 0.50mm/px · z∈[-63,+77]mm · 7 of 29 slices shown (1 of 2)]
[im 1/29]
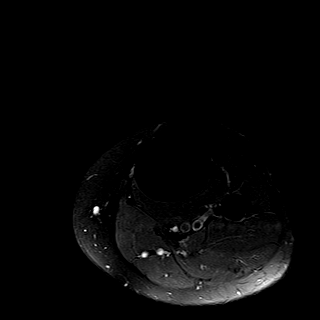
[im 5/29]
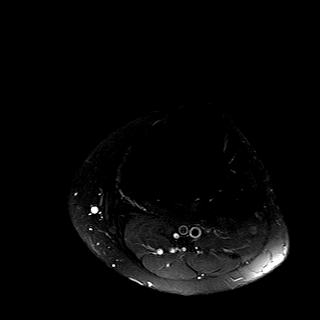
[im 10/29]
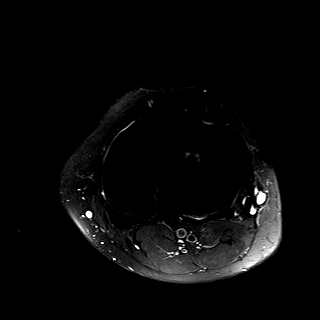
[im 15/29]
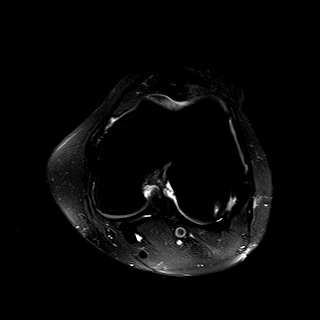
[im 19/29]
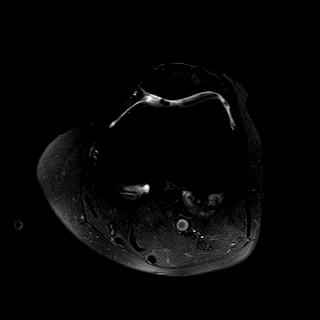
[im 24/29]
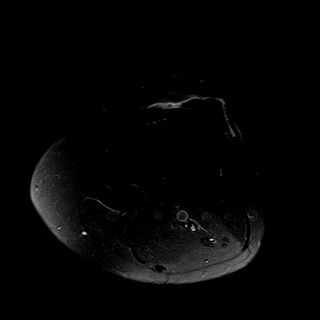
[im 29/29]
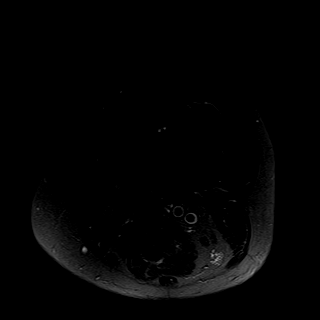

[Series 5: T1 fat-sat · axial · 4.0mm · 0.31mm/px · z∈[-56,+64]mm · 3 of 34 slices shown]
[im 5/34]
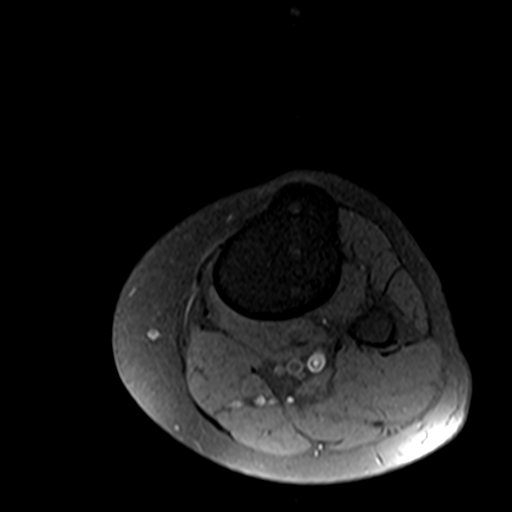
[im 19/34]
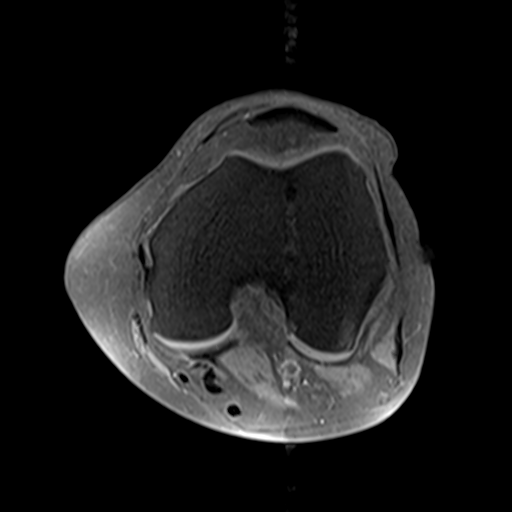
[im 29/34]
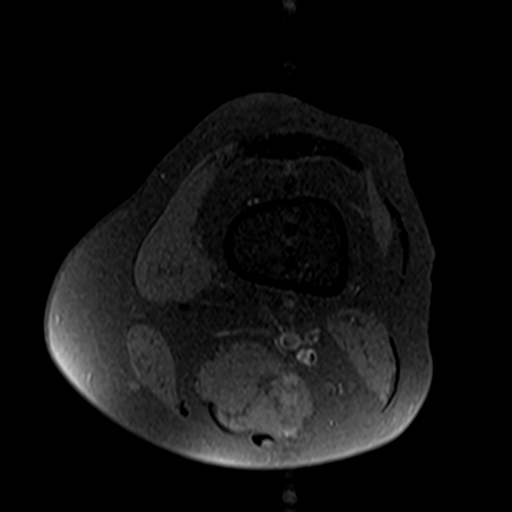

[Series 7: T2 fat-sat · coronal · 4.0mm · 0.29mm/px · 6 of 24 slices shown (2 of 2)]
[im 1/24]
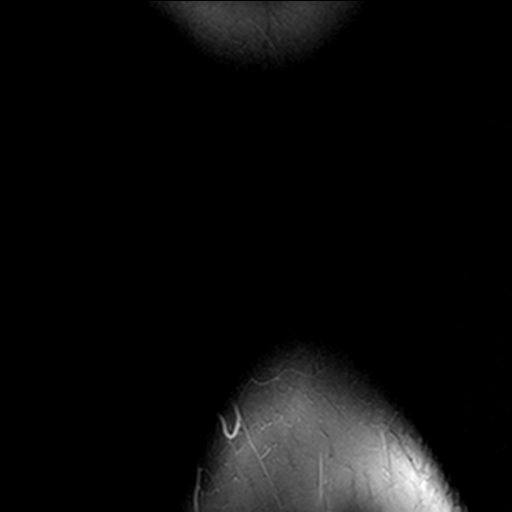
[im 5/24]
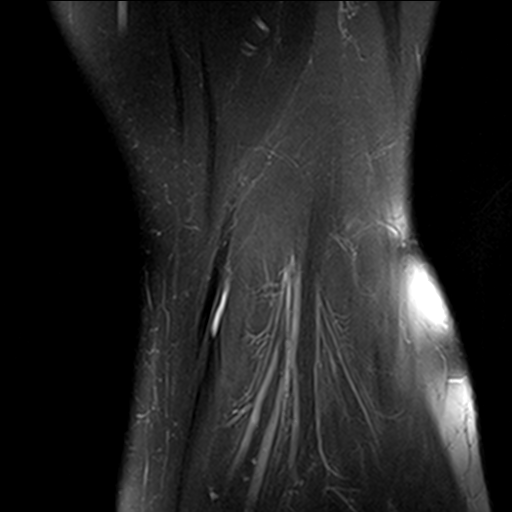
[im 10/24]
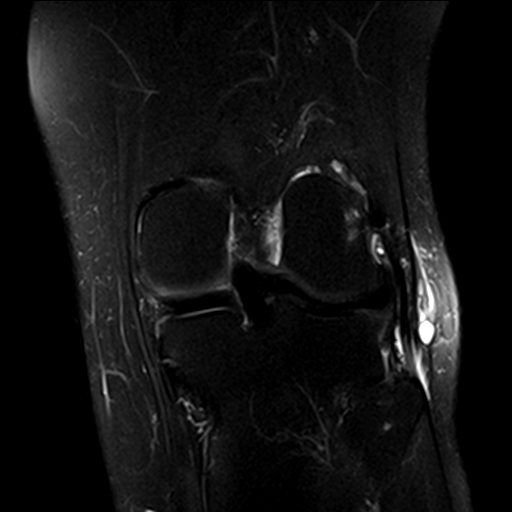
[im 14/24]
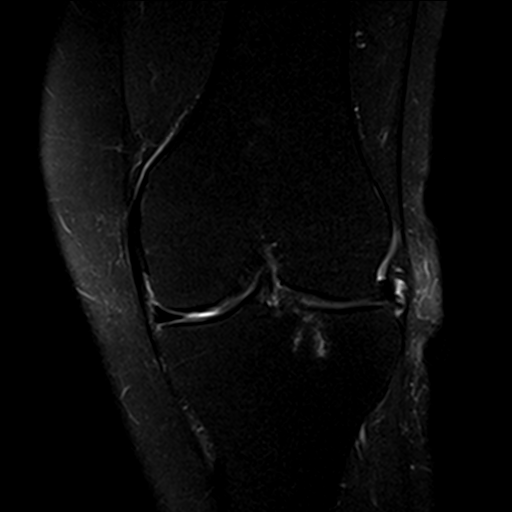
[im 19/24]
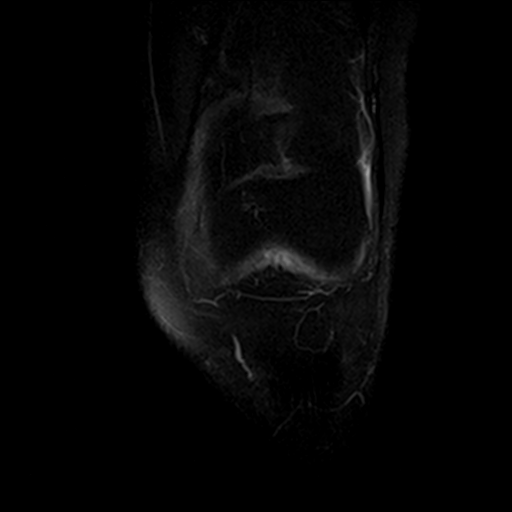
[im 24/24]
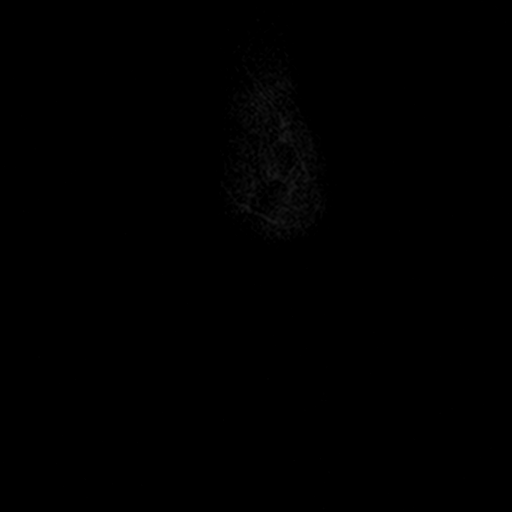

[22 of 40 positions shown; findings below may reference images not displayed]

FINDINGS: MENISCI

Medial meniscus:  Intact

Lateral meniscus: Degenerative free edge fraying involving the
anterior horn and possible shallow surface tears.

LIGAMENTS

Cruciates:  Intact

Collaterals:  Intact

CARTILAGE

Patellofemoral: Moderate degenerative chondrosis with moderate
cartilage thinning and early joint space narrowing. Mild subchondral
edema and subchondral cystic change involving the lateral facet near
the patellar apex.

Medial: Degenerative chondrosis within area of near full-thickness
cartilage loss

Lateral: Moderate to advanced degenerative chondrosis with mild
joint space narrowing and spurring.

Joint:  No joint effusion.

Popliteal Fossa:  No popliteal mass or Baker's cyst.

Extensor Mechanism: The patella retinacular structures are intact
and the quadriceps and patellar tendons are intact.

Bones: Degenerative subchondral cystic changes and probable small
intraosseous ganglion of on the lateral femoral condyle.

Other: The patient's palpable abnormality corresponds to a 2 cm
multi septated cystic lesion which dissects through the lateral
capsule and into the subcutaneous tissues. It also may communicate
with a small intraosseous ganglion in the lateral femoral condyle.
IMPRESSION: 1. The patient's palpable abnormality corresponds to a multi
septated cystic lesion, likely benign ganglion cyst, which dissects
through the lateral capsule and into the subcutaneous tissues. This
may communicate with a small intraosseous ganglion in the lateral
femoral condyle.
2. Degenerative free edge fraying involving the anterior horn of the
lateral meniscus and possible shallow surface tears.
3. Intact ligamentous structures and no joint effusion.
4. Tricompartmental degenerative changes.

## 2021-09-18 ENCOUNTER — Ambulatory Visit: Payer: Commercial Managed Care - HMO | Admitting: Family Medicine

## 2021-10-02 ENCOUNTER — Encounter: Payer: Self-pay | Admitting: Family Medicine

## 2021-10-02 ENCOUNTER — Other Ambulatory Visit: Payer: Self-pay

## 2021-10-02 ENCOUNTER — Ambulatory Visit (INDEPENDENT_AMBULATORY_CARE_PROVIDER_SITE_OTHER): Payer: Medicare Other | Admitting: Family Medicine

## 2021-10-02 VITALS — BP 102/60 | HR 95 | Temp 97.6°F | Resp 18 | Ht 68.5 in | Wt 131.8 lb

## 2021-10-02 DIAGNOSIS — E890 Postprocedural hypothyroidism: Secondary | ICD-10-CM

## 2021-10-02 DIAGNOSIS — M5412 Radiculopathy, cervical region: Secondary | ICD-10-CM

## 2021-10-02 DIAGNOSIS — E785 Hyperlipidemia, unspecified: Secondary | ICD-10-CM

## 2021-10-02 DIAGNOSIS — R296 Repeated falls: Secondary | ICD-10-CM

## 2021-10-02 LAB — CBC WITH DIFFERENTIAL/PLATELET
Basophils Absolute: 0.1 10*3/uL (ref 0.0–0.1)
Basophils Relative: 1.4 % (ref 0.0–3.0)
Eosinophils Absolute: 0.1 10*3/uL (ref 0.0–0.7)
Eosinophils Relative: 2.3 % (ref 0.0–5.0)
HCT: 42 % (ref 36.0–46.0)
Hemoglobin: 13.9 g/dL (ref 12.0–15.0)
Lymphocytes Relative: 49.5 % — ABNORMAL HIGH (ref 12.0–46.0)
Lymphs Abs: 2.5 10*3/uL (ref 0.7–4.0)
MCHC: 33.2 g/dL (ref 30.0–36.0)
MCV: 93.1 fl (ref 78.0–100.0)
Monocytes Absolute: 0.6 10*3/uL (ref 0.1–1.0)
Monocytes Relative: 12.1 % — ABNORMAL HIGH (ref 3.0–12.0)
Neutro Abs: 1.7 10*3/uL (ref 1.4–7.7)
Neutrophils Relative %: 34.7 % — ABNORMAL LOW (ref 43.0–77.0)
Platelets: 299 10*3/uL (ref 150.0–400.0)
RBC: 4.51 Mil/uL (ref 3.87–5.11)
RDW: 14.8 % (ref 11.5–15.5)
WBC: 5 10*3/uL (ref 4.0–10.5)

## 2021-10-02 LAB — HEPATIC FUNCTION PANEL
ALT: 14 U/L (ref 0–35)
AST: 22 U/L (ref 0–37)
Albumin: 4.8 g/dL (ref 3.5–5.2)
Alkaline Phosphatase: 49 U/L (ref 39–117)
Bilirubin, Direct: 0.1 mg/dL (ref 0.0–0.3)
Total Bilirubin: 0.6 mg/dL (ref 0.2–1.2)
Total Protein: 8.5 g/dL — ABNORMAL HIGH (ref 6.0–8.3)

## 2021-10-02 LAB — BASIC METABOLIC PANEL
BUN: 16 mg/dL (ref 6–23)
CO2: 33 mEq/L — ABNORMAL HIGH (ref 19–32)
Calcium: 9.7 mg/dL (ref 8.4–10.5)
Chloride: 101 mEq/L (ref 96–112)
Creatinine, Ser: 0.96 mg/dL (ref 0.40–1.20)
GFR: 61.6 mL/min (ref >60.00)
Glucose, Bld: 52 mg/dL — ABNORMAL LOW (ref 70–99)
Potassium: 3.7 mEq/L (ref 3.5–5.1)
Sodium: 142 mEq/L (ref 135–145)

## 2021-10-02 LAB — LIPID PANEL
Cholesterol: 279 mg/dL — ABNORMAL HIGH (ref 0–200)
HDL: 106 mg/dL (ref >39.00)
LDL Cholesterol: 147 mg/dL — ABNORMAL HIGH (ref 0–99)
NonHDL: 172.93
Total CHOL/HDL Ratio: 3
Triglycerides: 130 mg/dL (ref 0.0–149.0)
VLDL: 26 mg/dL (ref 0.0–40.0)

## 2021-10-02 LAB — TSH: TSH: 3.92 u[IU]/mL (ref 0.35–5.50)

## 2021-10-02 MED ORDER — TRAMADOL HCL 50 MG PO TABS
50.0000 mg | ORAL_TABLET | Freq: Two times a day (BID) | ORAL | 0 refills | Status: DC | PRN
Start: 2021-10-02 — End: 2022-10-20

## 2021-10-02 MED ORDER — TIZANIDINE HCL 4 MG PO TABS: 4.0000 mg | ORAL_TABLET | Freq: Three times a day (TID) | ORAL | 0 refills | Status: DC | PRN

## 2021-10-02 NOTE — Assessment & Plan Note (Signed)
Pt has had falls dating back a number of years.  These particular falls all seem to have a mechanical cause and don't seem to be related to a neurologic issue.  Encouraged her to wear better footwear as flip flops can be dangerous and to be aware of her surroundings.  If pt continues to have multiple falls or falls w/o cause, will pursue neuro workup.  Pt expressed understanding and is in agreement w/ plan.  ?

## 2021-10-02 NOTE — Assessment & Plan Note (Signed)
Chronic problem.  Pt reports hands and feet are cold more often than not.  Currently on Levothyroxine 140mg daily.  Check labs.  Adjust meds prn  ?

## 2021-10-02 NOTE — Assessment & Plan Note (Signed)
Deteriorated.  Pt's neck and shoulder pain is now causing occipital HA.  sxs improve w/ heat and massage so encouraged her to continue.  Will add low dose muscle relaxer to use at night as this is when sxs are most notable.  Also discussed possibly switching her pillow for better neck support.  Pt expressed understanding and is in agreement w/ plan.  ?

## 2021-10-02 NOTE — Patient Instructions (Addendum)
Schedule your complete physical in 6 months ?We'll notify you of your lab results and make any changes if needed ?Use heat and massage for your back and neck pain ?Use the cyclobenzaprine as needed for muscle spasm ?Try and be aware of your surroundings to prevent falls (and wear supportive shoes!  Flip flops can be dangerous!!) ?Call with any questions or concerns ?Stay Safe!  Stay Healthy! ? ? ?

## 2021-10-02 NOTE — Progress Notes (Signed)
? ?  Subjective:  ? ? Patient ID: Tricia Woodward, female    DOB: 08-25-1955, 66 y.o.   MRN: [id number] ? ?HPI ?Hyperlipidemia- chronic problem.  Currently on Zetia '10mg'$  daily and fish oil.  Denies CP, abd pain, N/V. ? ?Hypothyroid- chronic problem, on Levothyroxine 136mg daily.  Pt reports her hands and feet are cold.  Denies changes to energy level.  No changes to skin/hair/nails ? ?Neck and shoulder pain- pt reports that at night she will develop tightness of neck and shoulders.  This causes an occipital HA.  Sxs improve w/ heat and massage.   ? ?Falls- pt has had 3 falls since last visit.  1 fall tripped over a mannequin in a store, 1 fall she slipped on a sandy step in her flip flops, and 1 was over a crack in the sidewalk.  Pt just had vision checked.  Denies dizziness, denies muscle weakness or gait abnormality ? ? ?Review of Systems ?For ROS see HPI  ?   ?Objective:  ? Physical Exam ?Vitals reviewed.  ?Constitutional:   ?   General: She is not in acute distress. ?   Appearance: Normal appearance. She is well-developed. She is not ill-appearing.  ?HENT:  ?   Head: Normocephalic and atraumatic.  ?Eyes:  ?   Conjunctiva/sclera: Conjunctivae normal.  ?   Pupils: Pupils are equal, round, and reactive to light.  ?Neck:  ?   Thyroid: No thyromegaly.  ?Cardiovascular:  ?   Rate and Rhythm: Normal rate and regular rhythm.  ?   Pulses: Normal pulses.  ?   Heart sounds: Normal heart sounds. No murmur heard. ?Pulmonary:  ?   Effort: Pulmonary effort is normal. No respiratory distress.  ?   Breath sounds: Normal breath sounds.  ?Abdominal:  ?   General: There is no distension.  ?   Palpations: Abdomen is soft.  ?   Tenderness: There is no abdominal tenderness.  ?Musculoskeletal:  ?   Cervical back: Normal range of motion and neck supple.  ?   Right lower leg: No edema.  ?   Left lower leg: No edema.  ?Lymphadenopathy:  ?   Cervical: No cervical adenopathy.  ?Skin: ?   General: Skin is warm and dry.  ?Neurological:  ?    Mental Status: She is alert and oriented to person, place, and time.  ?Psychiatric:     ?   Behavior: Behavior normal.  ? ? ? ? ? ?   ?Assessment & Plan:  ? ? ?

## 2021-10-02 NOTE — Assessment & Plan Note (Signed)
Chronic problem.  Currently on Zetia and Fish oil due to statin intolerance.  Check labs.  Adjust meds prn  ?

## 2021-10-10 ENCOUNTER — Encounter: Payer: Self-pay | Admitting: Family Medicine

## 2021-10-10 DIAGNOSIS — M5412 Radiculopathy, cervical region: Secondary | ICD-10-CM

## 2021-10-11 NOTE — Telephone Encounter (Signed)
Pt is requesting referral to PT can this be accommodated I do not see this mentioned in the previous note ? ?

## 2021-10-23 ENCOUNTER — Telehealth: Payer: Self-pay | Admitting: Pharmacist

## 2021-10-23 NOTE — Progress Notes (Signed)
Chronic Care Management Pharmacy Assistant   Name: Tricia Woodward  MRN: [id number] DOB: [birth year]   Reason for Encounter: Disease State - General Adherence Call      Recent office visits:  10/02/21 Annye Asa, MD - Family Medicine - Hyperlipidemia - Labs were ordered. Tizanidine (ZANAFLEX) 4 MG tablet prescribed. Follow up in 6 months for physical.   Recent consult visits:  None noted.   Hospital visits:  None in previous 6 months  Medications: Outpatient Encounter Medications as of 10/23/2021  Medication Sig   aspirin 81 MG EC tablet Take 1 tablet (81 mg total) by mouth daily. Swallow whole.   calcitRIOL (ROCALTROL) 0.5 MCG capsule Take 2 capsules (1 mcg total) by mouth daily.   cholecalciferol (VITAMIN D3) 25 MCG (1000 UNIT) tablet Take 2 tablets (2,000 Units total) by mouth daily.   clotrimazole-betamethasone (LOTRISONE) cream Apply 1 application topically 2 (two) times daily. To foot rash   diclofenac Sodium (VOLTAREN) 1 % GEL APPLY 2 G TOPICALLY 4 TIMES DAILY (Patient not taking: Reported on 10/02/2021)   ezetimibe (ZETIA) 10 MG tablet Take 1 tablet (10 mg total) by mouth daily.   fish oil-omega-3 fatty acids 1000 MG capsule Take 2 g by mouth daily.    levothyroxine (EUTHYROX) 100 MCG tablet Take 1 tablet (100 mcg total) by mouth daily before breakfast.   Multiple Vitamin (MULTIVITAMIN) tablet Take 1 tablet by mouth daily.   tiZANidine (ZANAFLEX) 4 MG tablet Take 1 tablet (4 mg total) by mouth every 8 (eight) hours as needed for muscle spasms.   traMADol (ULTRAM) 50 MG tablet Take 1 tablet (50 mg total) by mouth 2 (two) times daily as needed.   TURMERIC PO Take by mouth.   vitamin B-12 (CYANOCOBALAMIN) 1000 MCG tablet Take 1,000 mcg by mouth daily.   No facility-administered encounter medications on file as of 10/23/2021.    [place] for General Review Call   Chart Review:  Have there been any documented new, changed, or discontinued  medications since last visit? Yes (If yes, include name, dose, frequency, date) Has there been any documented recent hospitalizations or ED visits since last visit with Clinical Pharmacist? No Brief Summary (including medication and/or Diagnosis changes): Tizanidine (ZANAFLEX) 4 MG tablet    Adherence Review:  Does the Clinical Pharmacist Assistant have access to adherence rates? Yes Adherence rates for STAR metric medications (List medication(s)/day supply/ last 2 fill dates). Adherence rates for medications indicated for disease state being reviewed (List medication(s)/day supply/ last 2 fill dates). Does the patient have >5 day gap between last estimated fill dates for any of the above medications or other medication gaps? No Reason for medication gaps.   Disease State Questions:  Able to connect with Patient? No Did patient have any problems with their health recently? No Note problems and Concerns: Have you had any admissions or emergency room visits or worsening of your condition(s) since last visit? No Details of ED visit, hospital visit and/or worsening condition(s): Have you had any visits with new specialists or providers since your last visit? No Explain: Have you had any new health care problem(s) since your last visit? No New problem(s) reported: Have you run out of any of your medications since you last spoke with clinical pharmacist? No What caused you to run out of your medications? Are there any medications you are not taking as prescribed? No What kept you from taking your medications as prescribed? Are you having any issues or  side effects with your medications? No Note of issues or side effects: Do you have any other health concerns or questions you want to discuss with your Clinical Pharmacist before your next visit? No Note additional concerns and questions from Patient. Are there any health concerns that you feel we can do a better job addressing? No Note  Patient's response. Are you having any problems with any of the following since the last visit: (select all that apply)  None  Details: 12. Any falls since last visit? No  Details: 13. Any increased or uncontrolled pain since last visit? No  Details: 14. Next visit Type: telephone       Visit with: CCP        Date: 01/29/22        Time: 2:15pm  15. Additional Details? No    Care Gaps   AWV: done 07/15/21 Colonoscopy: due 02/07/26 DM Eye Exam: N/A DM Foot Exam: N/A Microalbumin: unknown HbgAIC: done 01/21/21 (6.2) DEXA: done 05/15/15 Mammogram: done 01/04/21     Star Rating Drugs: No Star Rating Drugs Noted.   Future Appointments  Date Time Provider [place]  01/29/2022  2:15 PM [facility] CCM PHARMACIST [facility]  03/31/2022 12:40 PM Midge Minium, MD [facility]    Multiple attempts were made to contact patient. Attempts were unsuccessful. / ls,CMA   Jobe Gibbon, Industry Pharmacist Assistant  [phone]

## 2021-11-12 ENCOUNTER — Other Ambulatory Visit: Payer: Self-pay | Admitting: Family Medicine

## 2021-11-12 DIAGNOSIS — E785 Hyperlipidemia, unspecified: Secondary | ICD-10-CM

## 2021-11-19 ENCOUNTER — Other Ambulatory Visit: Payer: Self-pay

## 2021-11-19 DIAGNOSIS — E209 Hypoparathyroidism, unspecified: Secondary | ICD-10-CM

## 2021-11-19 MED ORDER — CALCITRIOL 0.5 MCG PO CAPS: 1.0000 ug | ORAL_CAPSULE | Freq: Every day | ORAL | 0 refills | Status: DC

## 2021-11-25 ENCOUNTER — Encounter: Payer: Self-pay | Admitting: Family Medicine

## 2021-11-25 DIAGNOSIS — M5412 Radiculopathy, cervical region: Secondary | ICD-10-CM

## 2021-11-25 DIAGNOSIS — G8929 Other chronic pain: Secondary | ICD-10-CM

## 2021-11-25 NOTE — Telephone Encounter (Signed)
Pt is asking for a referral to new Endo with Dr Arnoldo Lenis retiring pt reports they did not assign a new provider  Also requesting you order Xrays, last OV in April I assume we need to do a visit for multiple requests okay to be virtual or would you like in office given XR request?

## 2021-11-26 ENCOUNTER — Telehealth: Payer: Self-pay | Admitting: Family Medicine

## 2021-11-26 ENCOUNTER — Telehealth: Payer: Self-pay

## 2021-11-26 NOTE — Telephone Encounter (Signed)
Called pt to schedule AWV with the Health Coach, no answer, LM to have her call back and schedule this appointment

## 2021-11-26 NOTE — Telephone Encounter (Signed)
Error

## 2021-11-29 NOTE — Telephone Encounter (Signed)
Attempted schedule pts AWV

## 2021-12-02 ENCOUNTER — Encounter: Payer: Self-pay | Admitting: Family Medicine

## 2021-12-02 NOTE — Telephone Encounter (Signed)
I left a message on patient's voice mail to change AWV appointment from in office visit to phone call. Patient is scheduled on 12/05/21 at 11:00.

## 2021-12-03 ENCOUNTER — Ambulatory Visit (INDEPENDENT_AMBULATORY_CARE_PROVIDER_SITE_OTHER)
Admission: RE | Admit: 2021-12-03 | Discharge: 2021-12-03 | Disposition: A | Discharge status: Home / Self Care | Payer: Medicare Other | Source: Ambulatory Visit | Attending: Family Medicine | Admitting: Family Medicine

## 2021-12-03 DIAGNOSIS — G8929 Other chronic pain: Secondary | ICD-10-CM | POA: Diagnosis not present

## 2021-12-03 DIAGNOSIS — M546 Pain in thoracic spine: Secondary | ICD-10-CM | POA: Diagnosis not present

## 2021-12-03 DIAGNOSIS — M545 Low back pain, unspecified: Secondary | ICD-10-CM | POA: Diagnosis not present

## 2021-12-03 DIAGNOSIS — M5412 Radiculopathy, cervical region: Secondary | ICD-10-CM

## 2021-12-03 DIAGNOSIS — M549 Dorsalgia, unspecified: Secondary | ICD-10-CM

## 2021-12-03 DIAGNOSIS — M542 Cervicalgia: Secondary | ICD-10-CM | POA: Diagnosis not present

## 2021-12-04 ENCOUNTER — Ambulatory Visit (INDEPENDENT_AMBULATORY_CARE_PROVIDER_SITE_OTHER): Payer: Medicare Other | Admitting: Internal Medicine

## 2021-12-04 ENCOUNTER — Encounter: Payer: Self-pay | Admitting: Internal Medicine

## 2021-12-04 ENCOUNTER — Encounter: Payer: Self-pay | Admitting: Family Medicine

## 2021-12-04 VITALS — BP 112/70 | HR 85 | Ht 68.5 in | Wt 132.0 lb

## 2021-12-04 DIAGNOSIS — E209 Hypoparathyroidism, unspecified: Secondary | ICD-10-CM | POA: Diagnosis not present

## 2021-12-04 DIAGNOSIS — E890 Postprocedural hypothyroidism: Secondary | ICD-10-CM | POA: Diagnosis not present

## 2021-12-04 LAB — VITAMIN D 25 HYDROXY (VIT D DEFICIENCY, FRACTURES): VITD: 66.41 ng/mL (ref 30.00–100.00)

## 2021-12-04 LAB — BASIC METABOLIC PANEL
BUN: 16 mg/dL (ref 6–23)
CO2: 29 mEq/L (ref 19–32)
Calcium: 9.3 mg/dL (ref 8.4–10.5)
Chloride: 100 mEq/L (ref 96–112)
Creatinine, Ser: 0.96 mg/dL (ref 0.40–1.20)
GFR: 61.52 mL/min (ref >60.00)
Glucose, Bld: 82 mg/dL (ref 70–99)
Potassium: 3.7 mEq/L (ref 3.5–5.1)
Sodium: 137 mEq/L (ref 135–145)

## 2021-12-04 LAB — TSH: TSH: 5.58 u[IU]/mL — ABNORMAL HIGH (ref 0.35–5.50)

## 2021-12-04 LAB — ALBUMIN: Albumin: 4.5 g/dL (ref 3.5–5.2)

## 2021-12-04 NOTE — Progress Notes (Signed)
Name: Tricia Woodward  MRN/ DOB: [id number], February 15, 1956    Age/ Sex: 66 y.o., female     PCP: Midge Minium, MD   Reason for Endocrinology Evaluation: Postoperative hypothyroidism/postoperative hypoparathyroidism     Initial Endocrinology Clinic Visit: 10/11/2013    PATIENT IDENTIFIER: Tricia Woodward is a 66 y.o., female with a past medical history of postoperative hypothyroidism and hypoparathyroidism . She has followed with [facility] since 10/11/2013 for consultative assistance with management of her  postoperative hypothyroidism and hypoparathyroidism.     HISTORICAL SUMMARY:   She is status post total thyroidectomy in 1970s due to benign reasons.  Following her thyroidectomy she has been noted with hypocalcemia and vitamin D deficiency.  She has been on calcitriol, and ergocalciferol She was on Forteo at some point but this was declined by insurance She was also prescribed Natpara but this was recalled  SUBJECTIVE:     Today (12/04/2021):  Tricia Woodward is here for a follow-up on postoperative hypothyroidism and hypoparathyroidism.    Has been unable to gain weight  She is tired and sleepy  She feels sad  Has no appetite  Has hand cramps  Gets loose stool when sad  Last COVID infection 08/2020  ENDOCRINE HOME MEDICATIONS: Calcitriol 0.5 mcg , 2 caps daily  Vitamin D3 2000 units IU ,daily Levothyroxine 100 mcg daily MVI daily   HISTORY:  Past Medical History:  Past Medical History:  Diagnosis Date   Allergy    Arthritis    COVID-19 virus infection    Frequent falls    Heart murmur    Hemorrhoids    past hx    History of hepatitis C    treated with meds x 3 months   Hyperlipidemia    Low blood sugar    Thyroid disease    parahypothyroid disease   Past Surgical History:  Past Surgical History:  Procedure Laterality Date   ABDOMINAL HYSTERECTOMY     partial   BREAST CYST EXCISION Bilateral 1983   BUNIONECTOMY     CARPAL  TUNNEL RELEASE Left 2017   COLONOSCOPY     DENTAL SURGERY     POLYPECTOMY     THYROID SURGERY Bilateral 1970   Social History:  reports that she has been smoking cigarettes. She started smoking about 42 years ago. She has been smoking an average of .2 packs per day. She has never used smokeless tobacco. She reports current alcohol use of about 5.0 standard drinks of alcohol per week. She reports that she does not use drugs. Family History:  Family History  Problem Relation Age of Onset   Cirrhosis Mother    Hypertension Father    Hypertension Sister    Arthritis Other    Hyperlipidemia Other    Aneurysm Other    Colon cancer Neg Hx    Esophageal cancer Neg Hx    Rectal cancer Neg Hx    Stomach cancer Neg Hx    Breast cancer Neg Hx    Colon polyps Neg Hx      HOME MEDICATIONS: Allergies as of 12/04/2021       Reactions   Penicillins    REACTION: Yeast Infections        Medication List        Accurate as of December 04, 2021 10:22 AM. If you have any questions, ask your nurse or doctor.          aspirin EC 81 MG tablet Take 1  tablet (81 mg total) by mouth daily. Swallow whole.   calcitRIOL 0.5 MCG capsule Commonly known as: ROCALTROL Take 2 capsules (1 mcg total) by mouth daily.   cholecalciferol 25 MCG (1000 UNIT) tablet Commonly known as: VITAMIN D3 Take 2 tablets (2,000 Units total) by mouth daily.   clotrimazole-betamethasone cream Commonly known as: LOTRISONE Apply 1 application topically 2 (two) times daily. To foot rash   diclofenac Sodium 1 % Gel Commonly known as: VOLTAREN APPLY 2 G TOPICALLY 4 TIMES DAILY   ezetimibe 10 MG tablet Commonly known as: Zetia Take 1 tablet (10 mg total) by mouth daily.   fish oil-omega-3 fatty acids 1000 MG capsule Take 2 g by mouth daily.   levothyroxine 100 MCG tablet Commonly known as: SYNTHROID TAKE 1 TABLET BY MOUTH ONCE DAILY BEFORE BREAKFAST   multivitamin tablet Take 1 tablet by mouth daily.    tiZANidine 4 MG tablet Commonly known as: Zanaflex Take 1 tablet (4 mg total) by mouth every 8 (eight) hours as needed for muscle spasms.   traMADol 50 MG tablet Commonly known as: ULTRAM Take 1 tablet (50 mg total) by mouth 2 (two) times daily as needed.   TURMERIC PO Take by mouth.   vitamin B-12 1000 MCG tablet Commonly known as: CYANOCOBALAMIN Take 1,000 mcg by mouth daily.          OBJECTIVE:   PHYSICAL EXAM: VS: BP 112/70 (BP Location: Left Arm, Patient Position: Sitting, Cuff Size: Small)   Pulse 85   Ht 5' 8.5" (1.74 m)   Wt 132 lb (59.9 kg)   SpO2 96%   BMI 19.78 kg/m    EXAM: General: Pt appears well and is in NAD  Hydration: Well-hydrated with moist mucous membranes and good skin turgor  Neck: General: Supple without adenopathy. Thyroid:  No goiter or nodules appreciated.   Lungs: Clear with good BS bilat with no rales, rhonchi, or wheezes  Heart: Auscultation: RRR.  Abdomen: Normoactive bowel sounds, soft, nontender, without masses or organomegaly palpable  Extremities:  BL LE: No pretibial edema normal ROM and strength.  Mental Status: Judgment, insight: Intact Orientation: Oriented to time, place, and person Mood and affect: No depression, anxiety, or agitation     DATA REVIEWED:   Latest Reference Range & Units 12/04/21 10:58  Sodium 135 - 145 mEq/L 137  Potassium 3.5 - 5.1 mEq/L 3.7  Chloride 96 - 112 mEq/L 100  CO2 19 - 32 mEq/L 29  Glucose 70 - 99 mg/dL 82  BUN 6 - 23 mg/dL 16  Creatinine 0.40 - 1.20 mg/dL 0.96  Calcium 8.4 - 10.5 mg/dL 9.3  Albumin 3.5 - 5.2 g/dL 4.5  GFR >60.00 mL/min 61.52    Latest Reference Range & Units 12/04/21 10:58  VITD 30.00 - 100.00 ng/mL 66.41    Latest Reference Range & Units 12/04/21 10:58  TSH 0.35 - 5.50 uIU/mL 5.58 (H)  (H): Data is abnormally high  ASSESSMENT / PLAN / RECOMMENDATIONS:   Postoperative hypothyroidism:  -Patient with depression and inability to gain weight, the symptoms could  not fully be explained by her recent TSH levels, and I have encouraged her to seek additional help if needed and if her symptoms do not resolve with adjustment -Her TSH is elevated, will increase levothyroxine as below  Medications  Stop levothyroxine 100 mcg Start levothyroxine 112 mcg daily  2.  Postoperative hypoparathyroidism:  -She was  on Nepara until it was recalled, she was very satisfied with that -Her serum calcium  and vitamin D are normal -No changes  Medication Continue calcitriol 0.5 mcg, 2 caps daily Continue multivitamin, 1 tablet daily Continue vitamin D3 2000 IU daily  Follow-up in 4 months  Signed electronically by: Mack Guise, MD  [facility]  [facility] [address] [place], [place] [address] Phone: [phone] FAX: [phone]      CC: Midge Minium, MD [address] [place] [address] Phone: [phone]  Fax: [phone]   Return to Endocrinology clinic as below: Future Appointments  Date Time Provider [place]  12/05/2021 11:00 AM [facility] HEALTH COACH [facility]  01/29/2022  2:15 PM [facility] CCM PHARMACIST [facility]  03/31/2022 12:40 PM Midge Minium, MD [facility]

## 2021-12-04 NOTE — Patient Instructions (Signed)

## 2021-12-05 ENCOUNTER — Encounter: Payer: Self-pay | Admitting: Internal Medicine

## 2021-12-05 ENCOUNTER — Encounter: Payer: Self-pay | Admitting: Family Medicine

## 2021-12-05 ENCOUNTER — Telehealth: Payer: Self-pay

## 2021-12-05 LAB — PARATHYROID HORMONE, INTACT (NO CA): PTH: 10 pg/mL — ABNORMAL LOW (ref 16–77)

## 2021-12-05 MED ORDER — VITAMIN D 25 MCG (1000 UNIT) PO TABS: 2000.0000 [IU] | ORAL_TABLET | Freq: Every day | ORAL | 3 refills | Status: AC

## 2021-12-05 MED ORDER — LEVOTHYROXINE SODIUM 112 MCG PO TABS: 112.0000 ug | ORAL_TABLET | Freq: Every day | ORAL | 3 refills | Status: DC

## 2021-12-05 MED ORDER — CALCITRIOL 0.5 MCG PO CAPS: 1.0000 ug | ORAL_CAPSULE | Freq: Every day | ORAL | 3 refills | Status: DC

## 2021-12-05 NOTE — Telephone Encounter (Signed)
Called patient x 4 for MWV no answer , unable to leave vm .  Patient may reschedule for the next available appointment.  L.Athziry Millican,LPN

## 2021-12-05 NOTE — Telephone Encounter (Signed)
Patient called.  Patient said she was waiting for call this morning and didn't realize her ringer was off due to a meeting she had yesterday.  Patient apologized and rescheduled appointment to 12/12/21. Patient asked if she doesn't answer her cell, please call her home number.  Patient said if there's a cancellation or no show she can be reached anytime except Monday, Wednesday and Friday before 10:30.

## 2021-12-06 ENCOUNTER — Encounter: Payer: Self-pay | Admitting: Family Medicine

## 2021-12-10 ENCOUNTER — Ambulatory Visit (INDEPENDENT_AMBULATORY_CARE_PROVIDER_SITE_OTHER): Payer: Medicare Other

## 2021-12-10 DIAGNOSIS — Z0000 Encounter for general adult medical examination without abnormal findings: Secondary | ICD-10-CM | POA: Diagnosis not present

## 2021-12-10 NOTE — Progress Notes (Signed)
Subjective:   Tricia Woodward is a 66 y.o. female who presents for an Initial Medicare Annual Wellness Visit.   I connected with Lorre Nick  today by telephone and verified that I am speaking with the correct person using two identifiers. Location patient: home Location provider: work Persons participating in the virtual visit: patient, provider.   I discussed the limitations, risks, security and privacy concerns of performing an evaluation and management service by telephone and the availability of in person appointments. I also discussed with the patient that there may be a patient responsible charge related to this service. The patient expressed understanding and verbally consented to this telephonic visit.    Interactive audio and video telecommunications were attempted between this provider and patient, however failed, due to patient having technical difficulties OR patient did not have access to video capability.  We continued and completed visit with audio only.    Review of Systems     Cardiac Risk Factors include: advanced age (>65mn, >>67women)     Objective:    Today's Vitals   There is no height or weight on file to calculate BMI.     12/10/2021   11:21 AM 11/02/2020    2:04 PM 09/25/2020    4:15 PM 12/02/2019    2:33 PM 02/04/2019    4:42 AM 06/14/2015    8:32 AM  Advanced Directives  Does Patient Have a Medical Advance Directive? Yes No No Yes No No  Type of [place]   [place]    Copy of [place]? No - copy requested   No - copy requested      Current Medications (verified) Outpatient Encounter Medications as of 12/10/2021  Medication Sig   aspirin 81 MG EC tablet Take 1 tablet (81 mg total) by mouth daily. Swallow whole.   calcitRIOL (ROCALTROL) 0.5 MCG capsule Take 2 capsules (1 mcg total) by mouth daily.   cholecalciferol (VITAMIN D3) 25 MCG  (1000 UNIT) tablet Take 2 tablets (2,000 Units total) by mouth daily.   clotrimazole-betamethasone (LOTRISONE) cream Apply 1 application topically 2 (two) times daily. To foot rash   diclofenac Sodium (VOLTAREN) 1 % GEL APPLY 2 G TOPICALLY 4 TIMES DAILY   ezetimibe (ZETIA) 10 MG tablet Take 1 tablet (10 mg total) by mouth daily.   fish oil-omega-3 fatty acids 1000 MG capsule Take 2 g by mouth daily.    levothyroxine (SYNTHROID) 112 MCG tablet Take 1 tablet (112 mcg total) by mouth daily.   Multiple Vitamin (MULTIVITAMIN) tablet Take 1 tablet by mouth daily.   tiZANidine (ZANAFLEX) 4 MG tablet Take 1 tablet (4 mg total) by mouth every 8 (eight) hours as needed for muscle spasms.   traMADol (ULTRAM) 50 MG tablet Take 1 tablet (50 mg total) by mouth 2 (two) times daily as needed.   TURMERIC PO Take by mouth.   vitamin B-12 (CYANOCOBALAMIN) 1000 MCG tablet Take 1,000 mcg by mouth daily.   No facility-administered encounter medications on file as of 12/10/2021.    Allergies (verified) Penicillins   History: Past Medical History:  Diagnosis Date   Allergy    Arthritis    COVID-19 virus infection    Frequent falls    Heart murmur    Hemorrhoids    past hx    History of hepatitis C    treated with meds x 3 months   Hyperlipidemia  Low blood sugar    Thyroid disease    parahypothyroid disease   Past Surgical History:  Procedure Laterality Date   ABDOMINAL HYSTERECTOMY     partial   BREAST CYST EXCISION Bilateral 1983   BUNIONECTOMY     CARPAL TUNNEL RELEASE Left 2017   COLONOSCOPY     DENTAL SURGERY     POLYPECTOMY     THYROID SURGERY Bilateral 1970   Family History  Problem Relation Age of Onset   Cirrhosis Mother    Hypertension Father    Hypertension Sister    Arthritis Other    Hyperlipidemia Other    Aneurysm Other    Colon cancer Neg Hx    Esophageal cancer Neg Hx    Rectal cancer Neg Hx    Stomach cancer Neg Hx    Breast cancer Neg Hx    Colon polyps Neg Hx     Social History   Socioeconomic History   Marital status: Single    Spouse name: Not on file   Number of children: Not on file   Years of education: Not on file   Highest education level: Not on file  Occupational History   Not on file  Tobacco Use   Smoking status: Heavy Smoker    Packs/day: 0.20    Types: Cigarettes    Start date: 12/16/1978   Smokeless tobacco: Never   Tobacco comments:    e cigs at times  Vaping Use   Vaping Use: Former  Substance and Sexual Activity   Alcohol use: Yes    Alcohol/week: 5.0 standard drinks of alcohol    Types: 2 Glasses of wine, 3 Cans of beer per week    Comment: 1-2 x   drinks per times a week   Drug use: No   Sexual activity: Not Currently  Other Topics Concern   Not on file  Social History Narrative   Not on file   Social Determinants of Health   Financial Resource Strain: Low Risk  (12/10/2021)   Overall Financial Resource Strain (CARDIA)    Difficulty of Paying Living Expenses: Not hard at all  Food Insecurity: No Food Insecurity (12/10/2021)   Hunger Vital Sign    Worried About Running Out of Food in the Last Year: Never true    Ran Out of Food in the Last Year: Never true  Transportation Needs: No Transportation Needs (12/10/2021)   PRAPARE - Hydrologist (Medical): No    Lack of Transportation (Non-Medical): No  Physical Activity: Sufficiently Active (12/10/2021)   Exercise Vital Sign    Days of Exercise per Week: 3 days    Minutes of Exercise per Session: 50 min  Stress: No Stress Concern Present (12/10/2021)   [place]    Feeling of Stress : Only a little  Social Connections: Moderately Integrated (12/10/2021)   Social Connection and Isolation Panel [NHANES]    Frequency of Communication with Friends and Family: Twice a week    Frequency of Social Gatherings with Friends and Family: Twice a week    Attends Religious  Services: More than 4 times per year    Active Member of [employer] or Organizations: Yes    Attends Music therapist: More than 4 times per year    Marital Status: Never married    Tobacco Counseling Ready to quit: Not Answered Counseling given: Not Answered Tobacco comments: e cigs at times  Clinical Intake:  Pre-visit preparation completed: Yes  Pain : No/denies pain     Nutritional Risks: None Diabetes: No  How often do you need to have someone help you when you read instructions, pamphlets, or other written materials from your doctor or pharmacy?: 1 - Never What is the last grade level you completed in school?: college  Diabetic?no   Interpreter Needed?: No  Information entered by :: L.Zaidin Blyden,LPN   Activities of Daily Living    12/10/2021   11:27 AM 03/29/2021    1:30 PM  In your present state of health, do you have any difficulty performing the following activities:  Hearing? 0 0  Vision? 0 0  Difficulty concentrating or making decisions? 0 0  Walking or climbing stairs? 0 0  Dressing or bathing? 0 0  Doing errands, shopping? 0 0  Preparing Food and eating ? N   Using the Toilet? N   In the past six months, have you accidently leaked urine? N   Do you have problems with loss of bowel control? N   Managing your Medications? N   Managing your Finances? N   Housekeeping or managing your Housekeeping? N     Patient Care Team: Midge Minium, MD as PCP - General (Family Medicine) Renato Shin, MD (Inactive) as Consulting Physician (Endocrinology) Edythe Clarity, [facility] (Pharmacist)  Indicate any recent Medical Services you may have received from other than [facility] providers in the past year (date may be approximate).     Assessment:   This is a routine wellness examination for Kirsty.  Hearing/Vision screen Vision Screening - Comments:: Annual eye exams wear glasses   Dietary issues and exercise activities discussed: Current Exercise  Habits: Home exercise routine, Type of exercise: walking, Time (Minutes): 45, Frequency (Times/Week): 3, Weekly Exercise (Minutes/Week): 135, Intensity: Mild, Exercise limited by: None identified   Goals Addressed   None    Depression Screen    12/10/2021   11:22 AM 12/10/2021   11:20 AM 10/02/2021   11:05 AM 06/19/2021    2:34 PM 03/29/2021    1:30 PM 01/21/2021    1:58 PM 07/04/2020   11:05 AM  PHQ 2/9 Scores  PHQ - 2 Score 0 0 0 '1 1 2 '$ 0  PHQ- 9 Score   0 '7 7 14 '$ 0    Fall Risk    12/10/2021   11:23 AM 10/02/2021   11:05 AM 06/19/2021    2:34 PM 03/29/2021    1:30 PM 01/21/2021    1:58 PM  Fall Risk   Falls in the past year? 0 0 1 1 0  Number falls in past yr: 0 0 1 0 0  Injury with Fall? 0 0 0 1 0  Risk for fall due to :  No Fall Risks History of fall(s) History of fall(s) No Fall Risks  Follow up Falls evaluation completed;Education provided Falls evaluation completed Falls evaluation completed Falls evaluation completed Falls evaluation completed    [place]:  Any stairs in or around the home? Yes  If so, are there any without handrails? No  Home free of loose throw rugs in walkways, pet beds, electrical cords, etc? Yes  Adequate lighting in your home to reduce risk of falls? Yes   ASSISTIVE DEVICES UTILIZED TO PREVENT FALLS:  Life alert? No  Use of a cane, walker or w/c? No  Grab bars in the bathroom? Yes  Shower chair or bench in shower? Yes  Elevated toilet seat or a handicapped toilet? Yes    Cognitive Function:  Normal cognitive status assessed by telephone conversation   by this Nurse Health Advisor. No abnormalities found.        Immunizations Immunization History  Administered Date(s) Administered   Tdap 02/19/2017, 02/04/2019, 09/25/2020    TDAP status: Up to date  Flu Vaccine status: Declined, Education has been provided regarding the importance of this vaccine but patient still declined. Advised may receive this  vaccine at local pharmacy or Health Dept. Aware to provide a copy of the vaccination record if obtained from local pharmacy or Health Dept. Verbalized acceptance and understanding.  Pneumococcal vaccine status: Due, Education has been provided regarding the importance of this vaccine. Advised may receive this vaccine at local pharmacy or Health Dept. Aware to provide a copy of the vaccination record if obtained from local pharmacy or Health Dept. Verbalized acceptance and understanding.  Covid-19 vaccine status: Completed vaccines  Qualifies for Shingles Vaccine? Yes   Zostavax completed No   Shingrix Completed?: No.    Education has been provided regarding the importance of this vaccine. Patient has been advised to call insurance company to determine out of pocket expense if they have not yet received this vaccine. Advised may also receive vaccine at local pharmacy or Health Dept. Verbalized acceptance and understanding.  Screening Tests Health Maintenance  Topic Date Due   COVID-19 Vaccine (1) Never done   Pneumonia Vaccine 14+ Years old (1 - PCV) Never done   Zoster Vaccines- Shingrix (1 of 2) Never done   MAMMOGRAM  01/04/2022   INFLUENZA VACCINE  01/14/2022   COLONOSCOPY (Pts 45-60yr Insurance coverage will need to be confirmed)  02/07/2026   TETANUS/TDAP  09/26/2030   DEXA SCAN  Completed   Hepatitis C Screening  Completed   HPV VACCINES  Aged Out    Health Maintenance  Health Maintenance Due  Topic Date Due   COVID-19 Vaccine (1) Never done   Pneumonia Vaccine 67 Years old (1 - PCV) Never done   Zoster Vaccines- Shingrix (1 of 2) Never done    Colorectal cancer screening: Type of screening: Colonoscopy. Completed /02/07/2021. Repeat every 5 years  Mammogram status: Completed 01/04/2021. Repeat every year  Bone Density status: Completed 05/15/2015. Results reflect: Bone density results: OSTEOPENIA. Repeat every 5 years.  Lung Cancer Screening: (Low Dose CT Chest  recommended if Age 67-80years, 30 pack-year currently smoking OR have quit w/in 15years.) does not qualify.   Lung Cancer Screening Referral: n/a  Additional Screening:  Hepatitis C Screening: does not qualify;  Vision Screening: Recommended annual ophthalmology exams for early detection of glaucoma and other disorders of the eye. Is the patient up to date with their annual eye exam?  Yes  Who is the provider or what is the name of the office in which the patient attends annual eye exams? [facility] If pt is not established with a provider, would they like to be referred to a provider to establish care? No .   Dental Screening: Recommended annual dental exams for proper oral hygiene  Community Resource Referral / Chronic Care Management: CRR required this visit?  No   CCM required this visit?  No      Plan:     I have personally reviewed and noted the following in the patient's chart:   Medical and social history Use of alcohol, tobacco or illicit drugs  Current medications and supplements including opioid prescriptions. Patient is not  currently taking opioid prescriptions. Functional ability and status Nutritional status Physical activity Advanced directives List of other physicians Hospitalizations, surgeries, and ER visits in previous 12 months Vitals Screenings to include cognitive, depression, and falls Referrals and appointments  In addition, I have reviewed and discussed with patient certain preventive protocols, quality metrics, and best practice recommendations. A written personalized care plan for preventive services as well as general preventive health recommendations were provided to patient.     Randel Pigg, LPN   09/06/4008   Nurse Notes: none

## 2021-12-12 ENCOUNTER — Ambulatory Visit: Payer: Medicare Other

## 2021-12-18 DIAGNOSIS — M9901 Segmental and somatic dysfunction of cervical region: Secondary | ICD-10-CM | POA: Diagnosis not present

## 2021-12-18 DIAGNOSIS — M5136 Other intervertebral disc degeneration, lumbar region: Secondary | ICD-10-CM | POA: Diagnosis not present

## 2021-12-18 DIAGNOSIS — M5032 Other cervical disc degeneration, mid-cervical region, unspecified level: Secondary | ICD-10-CM | POA: Diagnosis not present

## 2021-12-18 DIAGNOSIS — M5134 Other intervertebral disc degeneration, thoracic region: Secondary | ICD-10-CM | POA: Diagnosis not present

## 2021-12-18 DIAGNOSIS — M9904 Segmental and somatic dysfunction of sacral region: Secondary | ICD-10-CM | POA: Diagnosis not present

## 2021-12-18 DIAGNOSIS — M5386 Other specified dorsopathies, lumbar region: Secondary | ICD-10-CM | POA: Diagnosis not present

## 2021-12-18 DIAGNOSIS — M9903 Segmental and somatic dysfunction of lumbar region: Secondary | ICD-10-CM | POA: Diagnosis not present

## 2021-12-18 DIAGNOSIS — M9902 Segmental and somatic dysfunction of thoracic region: Secondary | ICD-10-CM | POA: Diagnosis not present

## 2021-12-20 DIAGNOSIS — M9901 Segmental and somatic dysfunction of cervical region: Secondary | ICD-10-CM | POA: Diagnosis not present

## 2021-12-20 DIAGNOSIS — M5032 Other cervical disc degeneration, mid-cervical region, unspecified level: Secondary | ICD-10-CM | POA: Diagnosis not present

## 2021-12-20 DIAGNOSIS — M9902 Segmental and somatic dysfunction of thoracic region: Secondary | ICD-10-CM | POA: Diagnosis not present

## 2021-12-20 DIAGNOSIS — M9904 Segmental and somatic dysfunction of sacral region: Secondary | ICD-10-CM | POA: Diagnosis not present

## 2021-12-20 DIAGNOSIS — M5136 Other intervertebral disc degeneration, lumbar region: Secondary | ICD-10-CM | POA: Diagnosis not present

## 2021-12-20 DIAGNOSIS — M5134 Other intervertebral disc degeneration, thoracic region: Secondary | ICD-10-CM | POA: Diagnosis not present

## 2021-12-20 DIAGNOSIS — M5386 Other specified dorsopathies, lumbar region: Secondary | ICD-10-CM | POA: Diagnosis not present

## 2021-12-20 DIAGNOSIS — M9903 Segmental and somatic dysfunction of lumbar region: Secondary | ICD-10-CM | POA: Diagnosis not present

## 2021-12-25 ENCOUNTER — Other Ambulatory Visit: Payer: Self-pay

## 2021-12-25 DIAGNOSIS — E209 Hypoparathyroidism, unspecified: Secondary | ICD-10-CM

## 2021-12-25 MED ORDER — CALCITRIOL 0.5 MCG PO CAPS: 1.0000 ug | ORAL_CAPSULE | Freq: Every day | ORAL | 3 refills | Status: DC

## 2022-01-01 DIAGNOSIS — M5386 Other specified dorsopathies, lumbar region: Secondary | ICD-10-CM | POA: Diagnosis not present

## 2022-01-01 DIAGNOSIS — M5134 Other intervertebral disc degeneration, thoracic region: Secondary | ICD-10-CM | POA: Diagnosis not present

## 2022-01-01 DIAGNOSIS — M5032 Other cervical disc degeneration, mid-cervical region, unspecified level: Secondary | ICD-10-CM | POA: Diagnosis not present

## 2022-01-01 DIAGNOSIS — M9901 Segmental and somatic dysfunction of cervical region: Secondary | ICD-10-CM | POA: Diagnosis not present

## 2022-01-01 DIAGNOSIS — M5136 Other intervertebral disc degeneration, lumbar region: Secondary | ICD-10-CM | POA: Diagnosis not present

## 2022-01-01 DIAGNOSIS — M9902 Segmental and somatic dysfunction of thoracic region: Secondary | ICD-10-CM | POA: Diagnosis not present

## 2022-01-01 DIAGNOSIS — M9904 Segmental and somatic dysfunction of sacral region: Secondary | ICD-10-CM | POA: Diagnosis not present

## 2022-01-01 DIAGNOSIS — M9903 Segmental and somatic dysfunction of lumbar region: Secondary | ICD-10-CM | POA: Diagnosis not present

## 2022-01-03 DIAGNOSIS — M9904 Segmental and somatic dysfunction of sacral region: Secondary | ICD-10-CM | POA: Diagnosis not present

## 2022-01-03 DIAGNOSIS — M5134 Other intervertebral disc degeneration, thoracic region: Secondary | ICD-10-CM | POA: Diagnosis not present

## 2022-01-03 DIAGNOSIS — M5032 Other cervical disc degeneration, mid-cervical region, unspecified level: Secondary | ICD-10-CM | POA: Diagnosis not present

## 2022-01-03 DIAGNOSIS — M5136 Other intervertebral disc degeneration, lumbar region: Secondary | ICD-10-CM | POA: Diagnosis not present

## 2022-01-03 DIAGNOSIS — M9903 Segmental and somatic dysfunction of lumbar region: Secondary | ICD-10-CM | POA: Diagnosis not present

## 2022-01-03 DIAGNOSIS — M9902 Segmental and somatic dysfunction of thoracic region: Secondary | ICD-10-CM | POA: Diagnosis not present

## 2022-01-03 DIAGNOSIS — M9901 Segmental and somatic dysfunction of cervical region: Secondary | ICD-10-CM | POA: Diagnosis not present

## 2022-01-03 DIAGNOSIS — M5386 Other specified dorsopathies, lumbar region: Secondary | ICD-10-CM | POA: Diagnosis not present

## 2022-01-04 ENCOUNTER — Other Ambulatory Visit: Payer: Self-pay | Admitting: Family Medicine

## 2022-01-04 DIAGNOSIS — R21 Rash and other nonspecific skin eruption: Secondary | ICD-10-CM

## 2022-01-06 DIAGNOSIS — M5386 Other specified dorsopathies, lumbar region: Secondary | ICD-10-CM | POA: Diagnosis not present

## 2022-01-06 DIAGNOSIS — M5032 Other cervical disc degeneration, mid-cervical region, unspecified level: Secondary | ICD-10-CM | POA: Diagnosis not present

## 2022-01-06 DIAGNOSIS — M5134 Other intervertebral disc degeneration, thoracic region: Secondary | ICD-10-CM | POA: Diagnosis not present

## 2022-01-06 DIAGNOSIS — M5136 Other intervertebral disc degeneration, lumbar region: Secondary | ICD-10-CM | POA: Diagnosis not present

## 2022-01-06 DIAGNOSIS — M9903 Segmental and somatic dysfunction of lumbar region: Secondary | ICD-10-CM | POA: Diagnosis not present

## 2022-01-06 DIAGNOSIS — M9901 Segmental and somatic dysfunction of cervical region: Secondary | ICD-10-CM | POA: Diagnosis not present

## 2022-01-06 DIAGNOSIS — M9902 Segmental and somatic dysfunction of thoracic region: Secondary | ICD-10-CM | POA: Diagnosis not present

## 2022-01-06 DIAGNOSIS — M9904 Segmental and somatic dysfunction of sacral region: Secondary | ICD-10-CM | POA: Diagnosis not present

## 2022-01-10 DIAGNOSIS — M9901 Segmental and somatic dysfunction of cervical region: Secondary | ICD-10-CM | POA: Diagnosis not present

## 2022-01-10 DIAGNOSIS — M5386 Other specified dorsopathies, lumbar region: Secondary | ICD-10-CM | POA: Diagnosis not present

## 2022-01-10 DIAGNOSIS — M9904 Segmental and somatic dysfunction of sacral region: Secondary | ICD-10-CM | POA: Diagnosis not present

## 2022-01-10 DIAGNOSIS — M9902 Segmental and somatic dysfunction of thoracic region: Secondary | ICD-10-CM | POA: Diagnosis not present

## 2022-01-10 DIAGNOSIS — M5032 Other cervical disc degeneration, mid-cervical region, unspecified level: Secondary | ICD-10-CM | POA: Diagnosis not present

## 2022-01-10 DIAGNOSIS — M9903 Segmental and somatic dysfunction of lumbar region: Secondary | ICD-10-CM | POA: Diagnosis not present

## 2022-01-10 DIAGNOSIS — M5134 Other intervertebral disc degeneration, thoracic region: Secondary | ICD-10-CM | POA: Diagnosis not present

## 2022-01-10 DIAGNOSIS — M5136 Other intervertebral disc degeneration, lumbar region: Secondary | ICD-10-CM | POA: Diagnosis not present

## 2022-01-13 DIAGNOSIS — M9903 Segmental and somatic dysfunction of lumbar region: Secondary | ICD-10-CM | POA: Diagnosis not present

## 2022-01-13 DIAGNOSIS — M5134 Other intervertebral disc degeneration, thoracic region: Secondary | ICD-10-CM | POA: Diagnosis not present

## 2022-01-13 DIAGNOSIS — M5386 Other specified dorsopathies, lumbar region: Secondary | ICD-10-CM | POA: Diagnosis not present

## 2022-01-13 DIAGNOSIS — M5032 Other cervical disc degeneration, mid-cervical region, unspecified level: Secondary | ICD-10-CM | POA: Diagnosis not present

## 2022-01-13 DIAGNOSIS — M5136 Other intervertebral disc degeneration, lumbar region: Secondary | ICD-10-CM | POA: Diagnosis not present

## 2022-01-13 DIAGNOSIS — M9901 Segmental and somatic dysfunction of cervical region: Secondary | ICD-10-CM | POA: Diagnosis not present

## 2022-01-13 DIAGNOSIS — M9902 Segmental and somatic dysfunction of thoracic region: Secondary | ICD-10-CM | POA: Diagnosis not present

## 2022-01-13 DIAGNOSIS — M9904 Segmental and somatic dysfunction of sacral region: Secondary | ICD-10-CM | POA: Diagnosis not present

## 2022-01-14 ENCOUNTER — Telehealth: Payer: Self-pay | Admitting: Pharmacist

## 2022-01-14 NOTE — Progress Notes (Signed)
Chronic Care Management Pharmacy Assistant   Name: Tricia Woodward  MRN: [id number] DOB: 11/29/1955   Reason for Encounter: Disease State - General Adherence Call     Recent office visits:  12/10/21 Annual Medicare Wellness Completed    Recent consult visits:  12/04/21 Collier Flowers, MD - Endocrinology - Hypothyroidism - Labs were ordered. Levothyroxine (SYNTHROID) 112 MCG tablet prescribed. Follow up in 4 months.   Hospital visits:  None in previous 6 months  Medications: Outpatient Encounter Medications as of 01/14/2022  Medication Sig   aspirin 81 MG EC tablet Take 1 tablet (81 mg total) by mouth daily. Swallow whole.   calcitRIOL (ROCALTROL) 0.5 MCG capsule Take 2 capsules (1 mcg total) by mouth daily.   cholecalciferol (VITAMIN D3) 25 MCG (1000 UNIT) tablet Take 2 tablets (2,000 Units total) by mouth daily.   clotrimazole-betamethasone (LOTRISONE) cream APPLY 1 APPLICATION TOPICALLY 2 TIMES DAILY TO FOOT RASH   diclofenac Sodium (VOLTAREN) 1 % GEL APPLY 2 G TOPICALLY 4 TIMES DAILY   ezetimibe (ZETIA) 10 MG tablet Take 1 tablet (10 mg total) by mouth daily.   fish oil-omega-3 fatty acids 1000 MG capsule Take 2 g by mouth daily.    levothyroxine (SYNTHROID) 112 MCG tablet Take 1 tablet (112 mcg total) by mouth daily.   Multiple Vitamin (MULTIVITAMIN) tablet Take 1 tablet by mouth daily.   tiZANidine (ZANAFLEX) 4 MG tablet Take 1 tablet (4 mg total) by mouth every 8 (eight) hours as needed for muscle spasms.   traMADol (ULTRAM) 50 MG tablet Take 1 tablet (50 mg total) by mouth 2 (two) times daily as needed.   TURMERIC PO Take by mouth.   vitamin B-12 (CYANOCOBALAMIN) 1000 MCG tablet Take 1,000 mcg by mouth daily.   No facility-administered encounter medications on file as of 01/14/2022.    [place] for General Review Call   Chart Review:  Have there been any documented new, changed, or discontinued medications since last visit? No (If yes, include  name, dose, frequency, date) Has there been any documented recent hospitalizations or ED visits since last visit with Clinical Pharmacist? No Brief Summary (including medication and/or Diagnosis changes):   Adherence Review:  Does the Clinical Pharmacist Assistant have access to adherence rates? Yes Adherence rates for STAR metric medications (List medication(s)/day supply/ last 2 fill dates). Adherence rates for medications indicated for disease state being reviewed (List medication(s)/day supply/ last 2 fill dates). Does the patient have >5 day gap between last estimated fill dates for any of the above medications or other medication gaps? No Reason for medication gaps.   Disease State Questions:  Able to connect with Patient? No Did patient have any problems with their health recently? No Note problems and Concerns: Have you had any admissions or emergency room visits or worsening of your condition(s) since last visit? No Details of ED visit, hospital visit and/or worsening condition(s): Have you had any visits with new specialists or providers since your last visit? No Explain: Have you had any new health care problem(s) since your last visit? No New problem(s) reported: Have you run out of any of your medications since you last spoke with clinical pharmacist? No What caused you to run out of your medications? Are there any medications you are not taking as prescribed? No What kept you from taking your medications as prescribed? Are you having any issues or side effects with your medications? No Note of issues or side effects: Do you have  any other health concerns or questions you want to discuss with your Clinical Pharmacist before your next visit? No Note additional concerns and questions from Patient. Are there any health concerns that you feel we can do a better job addressing? No Note Patient's response. Are you having any problems with any of the following since the last  visit: (select all that apply)  None  Details: 12. Any falls since last visit? No  Details: 13. Any increased or uncontrolled pain since last visit? No  Details: 14. Next visit Type: telephone       Visit with: CPP        Date:01/29/22        Time: 2:15PM  15. Additional Details? No    Care Gaps   AWV: done 12/10/21 Colonoscopy: due 02/07/26 DM Eye Exam: N/A DM Foot Exam: N/A Microalbumin: unknown HbgAIC: done 01/21/21 (6.2) DEXA: done 05/15/15 Mammogram: done 01/04/21     Star Rating Drugs: No Star Rating Drugs Noted   Future Appointments  Date Time Provider [place]  01/29/2022  2:15 PM [facility] PHARMACIST [facility]  03/31/2022 12:40 PM Midge Minium, MD [facility]  04/10/2022 11:10 AM Shamleffer, Melanie Crazier, MD [facility] None    Jobe Gibbon, [facility] Clinical Pharmacist Assistant  [phone]

## 2022-01-15 DIAGNOSIS — M5032 Other cervical disc degeneration, mid-cervical region, unspecified level: Secondary | ICD-10-CM | POA: Diagnosis not present

## 2022-01-15 DIAGNOSIS — M9901 Segmental and somatic dysfunction of cervical region: Secondary | ICD-10-CM | POA: Diagnosis not present

## 2022-01-15 DIAGNOSIS — M5386 Other specified dorsopathies, lumbar region: Secondary | ICD-10-CM | POA: Diagnosis not present

## 2022-01-15 DIAGNOSIS — M9904 Segmental and somatic dysfunction of sacral region: Secondary | ICD-10-CM | POA: Diagnosis not present

## 2022-01-15 DIAGNOSIS — M9902 Segmental and somatic dysfunction of thoracic region: Secondary | ICD-10-CM | POA: Diagnosis not present

## 2022-01-15 DIAGNOSIS — M5134 Other intervertebral disc degeneration, thoracic region: Secondary | ICD-10-CM | POA: Diagnosis not present

## 2022-01-15 DIAGNOSIS — M5136 Other intervertebral disc degeneration, lumbar region: Secondary | ICD-10-CM | POA: Diagnosis not present

## 2022-01-15 DIAGNOSIS — M9903 Segmental and somatic dysfunction of lumbar region: Secondary | ICD-10-CM | POA: Diagnosis not present

## 2022-01-16 NOTE — Progress Notes (Deleted)
Chronic Care Management Pharmacy Note  01/16/2022 Name:  Tricia Woodward MRN:  [id number] DOB:  1956-05-03  Summary: Tricia Woodward to start lozenges - still currently smoking 1/2 ppd. Recheck lipids - need to determine efficacy of Zetia  Subjective: Tricia Woodward is an 66 y.o. year old female who is a primary patient of Tabori, Aundra Millet, MD.  The CCM team was consulted for assistance with disease management and care coordination needs.    Engaged with patient by telephone for initial visit in response to provider referral for pharmacy case management and/or care coordination services.   Consent to Services:  The patient was given the following information about Chronic Care Management services today, agreed to services, and gave verbal consent: 1. CCM service includes personalized support from designated clinical staff supervised by the primary care provider, including individualized plan of care and coordination with other care providers 2. 24/7 contact phone numbers for assistance for urgent and routine care needs. 3. Service will only be billed when office clinical staff spend 20 minutes or more in a month to coordinate care. 4. Only one practitioner may furnish and bill the service in a calendar month. 5.The patient may stop CCM services at any time (effective at the end of the month) by phone call to the office staff. 6. The patient will be responsible for cost sharing (co-pay) of up to 20% of the service fee (after annual deductible is met). Patient agreed to services and consent obtained.  Patient Care Team: Midge Minium, MD as PCP - General (Family Medicine) Renato Shin, MD (Inactive) as Consulting Physician (Endocrinology) Edythe Clarity, [facility] (Pharmacist)  Hospital visits: None in previous 6 months  Objective:  Lab Results  Component Value Date   CREATININE 0.96 12/04/2021   CREATININE 0.96 10/02/2021   CREATININE 0.97 01/21/2021    Lab Results  Component  Value Date   HGBA1C 6.2 01/21/2021   HGBA1C 5.2 09/07/2019   Last diabetic Eye exam: No results found for: "HMDIABEYEEXA"  Last diabetic Foot exam: No results found for: "HMDIABFOOTEX"      Component Value Date/Time   CHOL 279 (H) 10/02/2021 1146   CHOL 199 01/21/2021 1452   CHOL 202 (H) 07/30/2020 1142   CHOL 227 (H) 06/27/2019 1156   TRIG 130.0 10/02/2021 1146   TRIG 75.0 01/21/2021 1452   TRIG 71.0 07/30/2020 1142   HDL 106.00 10/02/2021 1146   HDL 96.20 01/21/2021 1452   HDL 105.80 07/30/2020 1142   HDL 149 06/27/2019 1156   CHOLHDL 3 10/02/2021 1146   VLDL 26.0 10/02/2021 1146   LDLCALC 147 (H) 10/02/2021 1146   LDLCALC 87 01/21/2021 1452   LDLCALC 82 07/30/2020 1142   LDLCALC 172 (H) 03/07/2020 1126   LDLCALC 67 06/27/2019 1156   LDLDIRECT 111.7 10/13/2006 1152   LDLDIRECT 180.4 07/29/2006 1108       Latest Ref Rng & Units 12/04/2021   10:58 AM 10/02/2021   11:46 AM 01/21/2021    2:52 PM  Hepatic Function  Total Protein 6.0 - 8.3 g/dL  8.5  8.2   Albumin 3.5 - 5.2 g/dL 4.5  4.8  4.6   AST 0 - 37 U/L  22  26   ALT 0 - 35 U/L  14  18   Alk Phosphatase 39 - 117 U/L  49  46   Total Bilirubin 0.2 - 1.2 mg/dL  0.6  0.8   Bilirubin, Direct 0.0 - 0.3 mg/dL  0.1  Lab Results  Component Value Date/Time   TSH 5.58 (H) 12/04/2021 10:58 AM   TSH 3.92 10/02/2021 11:46 AM   FREET4 1.20 08/27/2020 11:33 AM       Latest Ref Rng & Units 10/02/2021   11:46 AM 01/21/2021    2:52 PM 01/03/2020    8:49 AM  CBC  WBC 4.0 - 10.5 K/uL 5.0  5.1  5.1   Hemoglobin 12.0 - 15.0 g/dL 13.9  13.4  13.3   Hematocrit 36.0 - 46.0 % 42.0  40.8  39.9   Platelets 150.0 - 400.0 K/uL 299.0  290.0  273.0     Lab Results  Component Value Date/Time   VD25OH 66.41 12/04/2021 10:58 AM   VD25OH 57.79 08/27/2020 11:33 AM    Clinical ASCVD:  The ASCVD Risk score (Arnett DK, et al., 2019) failed to calculate for the following reasons:   The systolic blood pressure is missing   The valid HDL  cholesterol range is 20 to 100 mg/dL   Social History   Tobacco Use  Smoking Status Heavy Smoker   Packs/day: 0.20   Types: Cigarettes   Start date: 12/16/1978  Smokeless Tobacco Never  Tobacco Comments   e cigs at times   BP Readings from Last 3 Encounters:  12/04/21 112/70  10/02/21 102/60  06/19/21 102/70   Pulse Readings from Last 3 Encounters:  12/04/21 85  10/02/21 95  06/19/21 70   Wt Readings from Last 3 Encounters:  12/04/21 132 lb (59.9 kg)  10/02/21 131 lb 12.8 oz (59.8 kg)  06/19/21 135 lb 3.2 oz (61.3 kg)   BMI Readings from Last 3 Encounters:  12/04/21 19.78 kg/m  10/02/21 19.75 kg/m  06/19/21 20.26 kg/m    Assessment: Review of patient past medical history, allergies, medications, health status, including review of consultants reports, laboratory and other test data, was performed as part of comprehensive evaluation and provision of chronic care management services.   SDOH:  (Social Determinants of Health) assessments and interventions performed: Yes   CCM Care Plan  Allergies  Allergen Reactions   Penicillins     REACTION: Yeast Infections    Medications Reviewed Today     Reviewed by Tricia Pigg, LPN (Licensed Practical Nurse) on 12/10/21 at 65  Med List Status: <None>   Medication Order Taking? Sig Documenting Provider Last Dose Status Informant  aspirin 81 MG EC tablet [id number] Yes Take 1 tablet (81 mg total) by mouth daily. Swallow whole. Midge Minium, MD Taking Active   calcitRIOL (ROCALTROL) 0.5 MCG capsule [id number] Yes Take 2 capsules (1 mcg total) by mouth daily. Shamleffer, Melanie Crazier, MD Taking Active   cholecalciferol (VITAMIN D3) 25 MCG (1000 UNIT) tablet [id number] Yes Take 2 tablets (2,000 Units total) by mouth daily. Shamleffer, Melanie Crazier, MD Taking Active   clotrimazole-betamethasone Donalynn Furlong) cream [id number] Yes Apply 1 application topically 2 (two) times daily. To foot rash Midge Minium, MD Taking  Active   diclofenac Sodium (VOLTAREN) 1 % GEL [id number] Yes APPLY 2 G TOPICALLY 4 TIMES DAILY Wendie Agreste, MD Taking Active   ezetimibe (ZETIA) 10 MG tablet [id number] Yes Take 1 tablet (10 mg total) by mouth daily. Midge Minium, MD Taking Active   fish oil-omega-3 fatty acids 1000 MG capsule [id number] Yes Take 2 g by mouth daily.  [provider] Taking Active   levothyroxine (SYNTHROID) 112 MCG tablet [id number] Yes Take 1 tablet (112 mcg total) by mouth daily. Shamleffer, Melanie Crazier, MD Taking Active  Multiple Vitamin (MULTIVITAMIN) tablet [id number] Yes Take 1 tablet by mouth daily. [provider] Taking Active   tiZANidine (ZANAFLEX) 4 MG tablet [id number] Yes Take 1 tablet (4 mg total) by mouth every 8 (eight) hours as needed for muscle spasms. Midge Minium, MD Taking Active   traMADol Veatrice Bourbon) 50 MG tablet [id number] Yes Take 1 tablet (50 mg total) by mouth 2 (two) times daily as needed. Midge Minium, MD Taking Active   TURMERIC PO [id number] Yes Take by mouth. [provider] Taking Active   vitamin B-12 (CYANOCOBALAMIN) 1000 MCG tablet [id number] Yes Take 1,000 mcg by mouth daily. [provider] Taking Active             Patient Active Problem List   Diagnosis Date Noted   PAD (peripheral artery disease) ([place]) 06/19/2021   Statin intolerance 06/19/2021   B12 deficiency 08/27/2020   Primary osteoarthritis of left knee 01/04/2020   Chronic hepatitis C without hepatic coma ([place]) 11/29/2019   Chronic left-sided low back pain without sciatica 11/23/2019   Cervical radiculopathy 11/23/2019   Knee mass, left 11/23/2019   Pre-diabetes 06/27/2019   Numbness 07/21/2018   Status post carpal tunnel release 04/17/2016   Physical exam 05/03/2015   OAB (overactive bladder) 11/02/2014   Post-surgical hypothyroidism 10/10/2014   Visual loss 10/10/2014   Hypotension 10/10/2014   Chronic back pain 09/07/2013   Paresthesia of hand,  bilateral 09/07/2013   Recurrent falls 01/17/2013   TOBACCO ABUSE 05/15/2010   GOITER 05/14/2010   Hyperlipidemia 05/14/2010   HYPOCALCEMIA 05/14/2010   HYPOPARATHYROIDISM 03/14/2010    Immunization History  Administered Date(s) Administered   Tdap 02/19/2017, 02/04/2019, 09/25/2020    Conditions to be addressed/monitored: HLD preDM smoking cessation OA Hypothyroidism hypoparathyroidism   There are no care plans that you recently modified to display for this patient.     Patient's preferred pharmacy is:  [facility], [address] [place] [address] Phone: [phone] Fax: [phone]    Pt endorses 100% compliance  Follow Up:  Patient agrees to Care Plan and Follow-up. Plan: HC 1 month fu call on smoking cessation. Pharmacist 3 month f/u telephone visit   Future Appointments  Date Time Provider [place]  01/29/2022  2:15 PM [facility] PHARMACIST [facility]  03/31/2022 12:40 PM Midge Minium, MD [facility]  04/10/2022 11:10 AM Shamleffer, Melanie Crazier, MD [facility] None    Beverly Milch, PharmD Clinical Pharmacist  [facility] [phone]      Current Barriers:  Unable to achieve control of smoking cessation    Pharmacist Clinical Goal(s):  Patient will achieve adherence to monitoring guidelines and medication adherence to achieve therapeutic efficacy through collaboration with PharmD and provider.   Interventions: 1:1 collaboration with Midge Minium, MD regarding development and update of comprehensive plan of care as evidenced by provider attestation and co-signature Inter-disciplinary care team collaboration (see longitudinal plan of care) Comprehensive medication review performed; medication list updated in electronic medical record  Hyperlipidemia: (LDL goal < 100) -Controlled -Current treatment: Zetia 67m daily Appropriate, Query  effective, -Medications previously tried: atorvastatin  -Side effect review - noted some previous aches, none at present. No recent gaps identified in [place] hx.  -Educated on Cholesterol goals;  Benefits of statin for ASCVD risk reduction; Importance of limiting foods high in cholesterol; -Recommended to continue current medication  Update 07/31/21 She switched from Crestor to Zetia due to body  aches. She has not had lipid panel since this switch.  Previous lipid panels while she was on Crestor were adequately controlled. She is still active working out at [employer]. Has upcoming visit with PCP in April - recommend recheck lipids at this time. If increased - could consider resuming statin only a few times per week if she is agreeable. No changes until results from lipid panel.  Prediabetes (A1c goal <6.5%) -Controlled -Current medications: No current medications  -Medications previously tried: n/a  -Current home glucose readings fasting glucose: n/a post prandial glucose: n/a -Denies hypoglycemic/hyperglycemic symptoms -Current meal patterns: some processed foods, breads noted -Current exercise: goes to [employer] 3x/wk - water walking -Educated on A1c and blood sugar goals; -Counseled to check feet daily and get yearly eye exams -Recommended to continue current medication  Hypothyroidism (Goal: ensure appropriate use of levothyroxine) -Controlled -Current treatment  Euthyrox 100 mcg once daily  -Reviewed administration - reports consistent use, no problems noted -Medications previously tried: n/a  -Recommended to continue current medication  Smoking cessation (Goal: stop smoking) -Controlled -time to first cig less than 30 minutes -pattern and emotional triggers -open to NRT options - lozenge  -Current treatment  START Nicotine lozenge 4 mg - Weeks 1 to 6: 1 lozenge every 1 to 2 hours (maximum: 5 lozenges every 6 hours; 20 lozenges/day); to increase chances of quitting, use at least  9 lozenges/day during the first 6 weeks. Appropriate, Query effective,,  -Medications previously tried: may have tried oral NRT ~15 yrs ago   -Recommended to continue current medication -Consider DEXA repeat - last 2016, BMI ~20 -Referral placed for Quitline [place]  Update 07/31/21 She is still reporting smoking about 1/2 ppd.  She did not start lozenges. After discussion today she has agreed to give the lozenges a try.  We also discussed using a jar for money to put in whatever she would typically spend on cigarettes.  This way she can visually see the money she would save by quitting. Will continue to pursue smoking cessation. CMA to follow up next month to determine if having success.  Patient Goals/Self-Care Activities Patient will:  - take medications as prescribed collaborate with provider on medication access solutions target a minimum of 150 minutes of moderate intensity exercise weekly engage in dietary modifications by reducing carbohydrate intake including bread, pasta, rice, crackers  Medication Assistance: None required.  Patient affirms current coverage meets needs.

## 2022-01-20 DIAGNOSIS — M5136 Other intervertebral disc degeneration, lumbar region: Secondary | ICD-10-CM | POA: Diagnosis not present

## 2022-01-20 DIAGNOSIS — M9904 Segmental and somatic dysfunction of sacral region: Secondary | ICD-10-CM | POA: Diagnosis not present

## 2022-01-20 DIAGNOSIS — M9902 Segmental and somatic dysfunction of thoracic region: Secondary | ICD-10-CM | POA: Diagnosis not present

## 2022-01-20 DIAGNOSIS — M9901 Segmental and somatic dysfunction of cervical region: Secondary | ICD-10-CM | POA: Diagnosis not present

## 2022-01-20 DIAGNOSIS — M9903 Segmental and somatic dysfunction of lumbar region: Secondary | ICD-10-CM | POA: Diagnosis not present

## 2022-01-20 DIAGNOSIS — M5032 Other cervical disc degeneration, mid-cervical region, unspecified level: Secondary | ICD-10-CM | POA: Diagnosis not present

## 2022-01-20 DIAGNOSIS — M5134 Other intervertebral disc degeneration, thoracic region: Secondary | ICD-10-CM | POA: Diagnosis not present

## 2022-01-20 DIAGNOSIS — M5386 Other specified dorsopathies, lumbar region: Secondary | ICD-10-CM | POA: Diagnosis not present

## 2022-01-22 DIAGNOSIS — M9903 Segmental and somatic dysfunction of lumbar region: Secondary | ICD-10-CM | POA: Diagnosis not present

## 2022-01-22 DIAGNOSIS — M5136 Other intervertebral disc degeneration, lumbar region: Secondary | ICD-10-CM | POA: Diagnosis not present

## 2022-01-22 DIAGNOSIS — M9904 Segmental and somatic dysfunction of sacral region: Secondary | ICD-10-CM | POA: Diagnosis not present

## 2022-01-22 DIAGNOSIS — M5386 Other specified dorsopathies, lumbar region: Secondary | ICD-10-CM | POA: Diagnosis not present

## 2022-01-22 DIAGNOSIS — M5032 Other cervical disc degeneration, mid-cervical region, unspecified level: Secondary | ICD-10-CM | POA: Diagnosis not present

## 2022-01-22 DIAGNOSIS — M5134 Other intervertebral disc degeneration, thoracic region: Secondary | ICD-10-CM | POA: Diagnosis not present

## 2022-01-22 DIAGNOSIS — M9902 Segmental and somatic dysfunction of thoracic region: Secondary | ICD-10-CM | POA: Diagnosis not present

## 2022-01-22 DIAGNOSIS — M9901 Segmental and somatic dysfunction of cervical region: Secondary | ICD-10-CM | POA: Diagnosis not present

## 2022-01-24 DIAGNOSIS — M9901 Segmental and somatic dysfunction of cervical region: Secondary | ICD-10-CM | POA: Diagnosis not present

## 2022-01-24 DIAGNOSIS — M5136 Other intervertebral disc degeneration, lumbar region: Secondary | ICD-10-CM | POA: Diagnosis not present

## 2022-01-24 DIAGNOSIS — M9903 Segmental and somatic dysfunction of lumbar region: Secondary | ICD-10-CM | POA: Diagnosis not present

## 2022-01-24 DIAGNOSIS — M5134 Other intervertebral disc degeneration, thoracic region: Secondary | ICD-10-CM | POA: Diagnosis not present

## 2022-01-24 DIAGNOSIS — M5386 Other specified dorsopathies, lumbar region: Secondary | ICD-10-CM | POA: Diagnosis not present

## 2022-01-24 DIAGNOSIS — M9902 Segmental and somatic dysfunction of thoracic region: Secondary | ICD-10-CM | POA: Diagnosis not present

## 2022-01-24 DIAGNOSIS — M9904 Segmental and somatic dysfunction of sacral region: Secondary | ICD-10-CM | POA: Diagnosis not present

## 2022-01-24 DIAGNOSIS — M5032 Other cervical disc degeneration, mid-cervical region, unspecified level: Secondary | ICD-10-CM | POA: Diagnosis not present

## 2022-01-29 ENCOUNTER — Telehealth: Payer: Medicare Other

## 2022-01-31 ENCOUNTER — Telehealth: Payer: Medicare Other

## 2022-01-31 DIAGNOSIS — M5134 Other intervertebral disc degeneration, thoracic region: Secondary | ICD-10-CM | POA: Diagnosis not present

## 2022-01-31 DIAGNOSIS — M5136 Other intervertebral disc degeneration, lumbar region: Secondary | ICD-10-CM | POA: Diagnosis not present

## 2022-01-31 DIAGNOSIS — M5386 Other specified dorsopathies, lumbar region: Secondary | ICD-10-CM | POA: Diagnosis not present

## 2022-01-31 DIAGNOSIS — M9902 Segmental and somatic dysfunction of thoracic region: Secondary | ICD-10-CM | POA: Diagnosis not present

## 2022-01-31 DIAGNOSIS — M5032 Other cervical disc degeneration, mid-cervical region, unspecified level: Secondary | ICD-10-CM | POA: Diagnosis not present

## 2022-01-31 DIAGNOSIS — M9901 Segmental and somatic dysfunction of cervical region: Secondary | ICD-10-CM | POA: Diagnosis not present

## 2022-01-31 DIAGNOSIS — M9903 Segmental and somatic dysfunction of lumbar region: Secondary | ICD-10-CM | POA: Diagnosis not present

## 2022-01-31 DIAGNOSIS — M9904 Segmental and somatic dysfunction of sacral region: Secondary | ICD-10-CM | POA: Diagnosis not present

## 2022-02-12 ENCOUNTER — Telehealth: Payer: Medicare Other

## 2022-02-14 ENCOUNTER — Ambulatory Visit: Payer: Medicare Other | Admitting: Pharmacist

## 2022-02-14 NOTE — Progress Notes (Signed)
Chronic Care Management Pharmacy Note  02/21/2022 Name:  Tricia Woodward MRN:  [id number] DOB:  1956-04-30  Summary: Patient doing well, LDL still elevated - not controlled with Zetia.  Could consider Repatha FU 6 months  Subjective: Tricia Woodward is an 66 y.o. year old female who is a primary patient of Tabori, Aundra Millet, MD.  The CCM team was consulted for assistance with disease management and care coordination needs.    Engaged with patient by telephone for initial visit in response to provider referral for pharmacy case management and/or care coordination services.   Consent to Services:  The patient was given the following information about Chronic Care Management services today, agreed to services, and gave verbal consent: 1. CCM service includes personalized support from designated clinical staff supervised by the primary care provider, including individualized plan of care and coordination with other care providers 2. 24/7 contact phone numbers for assistance for urgent and routine care needs. 3. Service will only be billed when office clinical staff spend 20 minutes or more in a month to coordinate care. 4. Only one practitioner may furnish and bill the service in a calendar month. 5.The patient may stop CCM services at any time (effective at the end of the month) by phone call to the office staff. 6. The patient will be responsible for cost sharing (co-pay) of up to 20% of the service fee (after annual deductible is met). Patient agreed to services and consent obtained.  Patient Care Team: Midge Minium, MD as PCP - General (Family Medicine) Renato Shin, MD (Inactive) as Consulting Physician (Endocrinology) Edythe Clarity, [facility] (Pharmacist)  Hospital visits: None in previous 6 months  Objective:  Lab Results  Component Value Date   CREATININE 0.96 12/04/2021   CREATININE 0.96 10/02/2021   CREATININE 0.97 01/21/2021    Lab Results  Component Value Date    HGBA1C 6.2 01/21/2021   HGBA1C 5.2 09/07/2019   Last diabetic Eye exam: No results found for: "HMDIABEYEEXA"  Last diabetic Foot exam: No results found for: "HMDIABFOOTEX"      Component Value Date/Time   CHOL 279 (H) 10/02/2021 1146   CHOL 199 01/21/2021 1452   CHOL 202 (H) 07/30/2020 1142   CHOL 227 (H) 06/27/2019 1156   TRIG 130.0 10/02/2021 1146   TRIG 75.0 01/21/2021 1452   TRIG 71.0 07/30/2020 1142   HDL 106.00 10/02/2021 1146   HDL 96.20 01/21/2021 1452   HDL 105.80 07/30/2020 1142   HDL 149 06/27/2019 1156   CHOLHDL 3 10/02/2021 1146   VLDL 26.0 10/02/2021 1146   LDLCALC 147 (H) 10/02/2021 1146   LDLCALC 87 01/21/2021 1452   LDLCALC 82 07/30/2020 1142   LDLCALC 172 (H) 03/07/2020 1126   LDLCALC 67 06/27/2019 1156   LDLDIRECT 111.7 10/13/2006 1152   LDLDIRECT 180.4 07/29/2006 1108       Latest Ref Rng & Units 12/04/2021   10:58 AM 10/02/2021   11:46 AM 01/21/2021    2:52 PM  Hepatic Function  Total Protein 6.0 - 8.3 g/dL  8.5  8.2   Albumin 3.5 - 5.2 g/dL 4.5  4.8  4.6   AST 0 - 37 U/L  22  26   ALT 0 - 35 U/L  14  18   Alk Phosphatase 39 - 117 U/L  49  46   Total Bilirubin 0.2 - 1.2 mg/dL  0.6  0.8   Bilirubin, Direct 0.0 - 0.3 mg/dL  0.1  Lab Results  Component Value Date/Time   TSH 5.58 (H) 12/04/2021 10:58 AM   TSH 3.92 10/02/2021 11:46 AM   FREET4 1.20 08/27/2020 11:33 AM       Latest Ref Rng & Units 10/02/2021   11:46 AM 01/21/2021    2:52 PM 01/03/2020    8:49 AM  CBC  WBC 4.0 - 10.5 K/uL 5.0  5.1  5.1   Hemoglobin 12.0 - 15.0 g/dL 13.9  13.4  13.3   Hematocrit 36.0 - 46.0 % 42.0  40.8  39.9   Platelets 150.0 - 400.0 K/uL 299.0  290.0  273.0     Lab Results  Component Value Date/Time   VD25OH 66.41 12/04/2021 10:58 AM   VD25OH 57.79 08/27/2020 11:33 AM    Clinical ASCVD:  The ASCVD Risk score (Arnett DK, et al., 2019) failed to calculate for the following reasons:   The systolic blood pressure is missing   The valid HDL cholesterol  range is 20 to 100 mg/dL   Social History   Tobacco Use  Smoking Status Heavy Smoker   Packs/day: 0.20   Types: Cigarettes   Start date: 12/16/1978  Smokeless Tobacco Never  Tobacco Comments   e cigs at times   BP Readings from Last 3 Encounters:  12/04/21 112/70  10/02/21 102/60  06/19/21 102/70   Pulse Readings from Last 3 Encounters:  12/04/21 85  10/02/21 95  06/19/21 70   Wt Readings from Last 3 Encounters:  12/04/21 132 lb (59.9 kg)  10/02/21 131 lb 12.8 oz (59.8 kg)  06/19/21 135 lb 3.2 oz (61.3 kg)   BMI Readings from Last 3 Encounters:  12/04/21 19.78 kg/m  10/02/21 19.75 kg/m  06/19/21 20.26 kg/m    Assessment: Review of patient past medical history, allergies, medications, health status, including review of consultants reports, laboratory and other test data, was performed as part of comprehensive evaluation and provision of chronic care management services.   SDOH:  (Social Determinants of Health) assessments and interventions performed: Yes SDOH Interventions    Flowsheet Row Clinical Support from 12/10/2021 in [place] Office Visit from 08/15/2019 in [facility] [place]  SDOH Interventions    Food Insecurity Interventions Intervention Not Indicated --  Housing Interventions Intervention Not Indicated --  Transportation Interventions Intervention Not Indicated --  Depression Interventions/Treatment  -- Currently on Treatment  Financial Strain Interventions Intervention Not Indicated --  Physical Activity Interventions Intervention Not Indicated --  Stress Interventions Intervention Not Indicated --  Social Connections Interventions Intervention Not Indicated --       CCM Care Plan  Allergies  Allergen Reactions   Penicillins     REACTION: Yeast Infections    Medications Reviewed Today     Reviewed by Edythe Clarity, [place] (Pharmacist) on 02/21/22 at 1508  Med List  Status: <None>   Medication Order Taking? Sig Documenting Provider Last Dose Status Informant  aspirin 81 MG EC tablet [id number] Yes Take 1 tablet (81 mg total) by mouth daily. Swallow whole. Midge Minium, MD Taking Active   calcitRIOL (ROCALTROL) 0.5 MCG capsule [id number] Yes Take 2 capsules (1 mcg total) by mouth daily. Shamleffer, Melanie Crazier, MD Taking Active   cholecalciferol (VITAMIN D3) 25 MCG (1000 UNIT) tablet [id number] Yes Take 2 tablets (2,000 Units total) by mouth daily. Shamleffer, Melanie Crazier, MD Taking Active   clotrimazole-betamethasone Donalynn Furlong) cream [id number] Yes APPLY 1 APPLICATION TOPICALLY 2 TIMES DAILY TO FOOT RASH Midge Minium, MD Taking  Active   diclofenac Sodium (VOLTAREN) 1 % GEL [id number] Yes APPLY 2 G TOPICALLY 4 TIMES DAILY Wendie Agreste, MD Taking Active   ezetimibe (ZETIA) 10 MG tablet [id number] Yes Take 1 tablet (10 mg total) by mouth daily. Midge Minium, MD Taking Active   fish oil-omega-3 fatty acids 1000 MG capsule [id number] Yes Take 2 g by mouth daily.  [provider] Taking Active   levothyroxine (SYNTHROID) 112 MCG tablet [id number] Yes Take 1 tablet (112 mcg total) by mouth daily. Shamleffer, Melanie Crazier, MD Taking Active   Multiple Vitamin (MULTIVITAMIN) tablet [id number] Yes Take 1 tablet by mouth daily. [provider] Taking Active   tiZANidine (ZANAFLEX) 4 MG tablet [id number] Yes Take 1 tablet (4 mg total) by mouth every 8 (eight) hours as needed for muscle spasms. Midge Minium, MD Taking Active   traMADol Veatrice Bourbon) 50 MG tablet [id number] Yes Take 1 tablet (50 mg total) by mouth 2 (two) times daily as needed. Midge Minium, MD Taking Active   TURMERIC PO [id number] Yes Take by mouth. [provider] Taking Active   vitamin B-12 (CYANOCOBALAMIN) 1000 MCG tablet [id number] Yes Take 1,000 mcg by mouth daily. [provider] Taking Active             Patient Active Problem  List   Diagnosis Date Noted   PAD (peripheral artery disease) ([place]) 06/19/2021   Statin intolerance 06/19/2021   B12 deficiency 08/27/2020   Primary osteoarthritis of left knee 01/04/2020   Chronic hepatitis C without hepatic coma ([place]) 11/29/2019   Chronic left-sided low back pain without sciatica 11/23/2019   Cervical radiculopathy 11/23/2019   Knee mass, left 11/23/2019   Pre-diabetes 06/27/2019   Numbness 07/21/2018   Status post carpal tunnel release 04/17/2016   Physical exam 05/03/2015   OAB (overactive bladder) 11/02/2014   Post-surgical hypothyroidism 10/10/2014   Visual loss 10/10/2014   Hypotension 10/10/2014   Chronic back pain 09/07/2013   Paresthesia of hand, bilateral 09/07/2013   Recurrent falls 01/17/2013   TOBACCO ABUSE 05/15/2010   GOITER 05/14/2010   Hyperlipidemia 05/14/2010   HYPOCALCEMIA 05/14/2010   HYPOPARATHYROIDISM 03/14/2010    Immunization History  Administered Date(s) Administered   Tdap 02/19/2017, 02/04/2019, 09/25/2020    Conditions to be addressed/monitored: HLD preDM smoking cessation OA Hypothyroidism hypoparathyroidism   Care Plan : ccm pharmacy care plan  Updates made by Edythe Clarity, RPH since 02/21/2022 12:00 AM     Problem: HLD preDM smoking cessation OA Hypothyroidism hypoparathyroidism   Priority: High     Long-Range Goal: disease management   Start Date: 03/27/2021  Expected End Date: 03/27/2022  Recent Progress: On track  Priority: High  Note:    Current Barriers:  Unable to achieve control of smoking cessation    Pharmacist Clinical Goal(s):  Patient will achieve adherence to monitoring guidelines and medication adherence to achieve therapeutic efficacy through collaboration with PharmD and provider.   Interventions: 1:1 collaboration with Midge Minium, MD regarding development and update of comprehensive plan of care as evidenced by provider attestation and co-signature Inter-disciplinary care team  collaboration (see longitudinal plan of care) Comprehensive medication review performed; medication list updated in electronic medical record  Hyperlipidemia: (LDL goal < 100) 02/14/22 -Uncontrolled -Current treatment: Zetia 40m daily Appropriate, Query effective, -Medications previously tried: atorvastatin  -Side effect review - could not tolerate statins - she is fine with just Zetia -Educated on Cholesterol goals;  Benefits of statin for ASCVD risk reduction; Importance of  limiting foods high in cholesterol; -Recommended to continue current medication -LDL increased considerably when she stopped statin.  However HDL ratio is good and patient does not wish to start a statin. Continue routine screenings   Update 07/31/21 She switched from Crestor to Zetia due to body aches. She has not had lipid panel since this switch.  Previous lipid panels while she was on Crestor were adequately controlled. She is still active working out at [employer]. Has upcoming visit with PCP in April - recommend recheck lipids at this time. If increased - could consider resuming statin only a few times per week if she is agreeable. No changes until results from lipid panel.  Prediabetes (A1c goal <6.5%) -Controlled -Current medications: No current medications  -Medications previously tried: n/a  -Current home glucose readings fasting glucose: n/a post prandial glucose: n/a -Denies hypoglycemic/hyperglycemic symptoms -Current meal patterns: some processed foods, breads noted -Current exercise: goes to [employer] 3x/wk - water walking -Educated on A1c and blood sugar goals; -Counseled to check feet daily and get yearly eye exams -Recommended to continue current medication  Hypothyroidism (Goal: ensure appropriate use of levothyroxine) 02/14/22 -Controlled -Current treatment  Euthyrox 100 mcg once daily  Appropriate, Effective, Safe, Accessible -Reviewed administration - reports consistent use, no problems  noted -Medications previously tried: n/a  -Recommended to continue current medication -TSH is normal, patient is adherent with no concerns at this time.  Smoking cessation (Goal: stop smoking) -Controlled, not assessed - patient was at a conference -time to first cig less than 30 minutes -pattern and emotional triggers -open to NRT options - lozenge  -Current treatment  START Nicotine lozenge 4 mg - Weeks 1 to 6: 1 lozenge every 1 to 2 hours (maximum: 5 lozenges every 6 hours; 20 lozenges/day); to increase chances of quitting, use at least 9 lozenges/day during the first 6 weeks. Appropriate, Query effective,,  -Medications previously tried: may have tried oral NRT ~15 yrs ago   -Recommended to continue current medication -Consider DEXA repeat - last 2016, BMI ~20 -Referral placed for Quitline Home  Update 07/31/21 She is still reporting smoking about 1/2 ppd.  She did not start lozenges. After discussion today she has agreed to give the lozenges a try.  We also discussed using a jar for money to put in whatever she would typically spend on cigarettes.  This way she can visually see the money she would save by quitting. Will continue to pursue smoking cessation. CMA to follow up next month to determine if having success.  Patient Goals/Self-Care Activities Patient will:  - take medications as prescribed collaborate with provider on medication access solutions target a minimum of 150 minutes of moderate intensity exercise weekly engage in dietary modifications by reducing carbohydrate intake including bread, pasta, rice, crackers  Medication Assistance: None required.  Patient affirms current coverage meets needs.           Compliance/Adherence/Medication fill history: Care Gaps: Mammogram  Star-Rating Drugs: N/A  Patient's preferred pharmacy is:  [facility], [address] [place] [address] Phone:  [phone] Fax: [phone]    Pt endorses 100% compliance  Follow Up:  Patient agrees to Care Plan and Follow-up. Plan: HC m3 onth fu call on smoking cessation. Pharmacist 3 month f/u telephone visit   Future Appointments  Date Time Provider [place]  03/31/2022 12:40 PM Midge Minium, MD [facility]  04/10/2022 11:10 AM Shamleffer, Melanie Crazier, MD [facility] None  Beverly Milch, PharmD Clinical Pharmacist  [facility] [phone]

## 2022-02-21 NOTE — Patient Instructions (Addendum)
Visit Information   Goals Addressed             This Visit's Progress    Maintain LDL < 100   Not on track    Timeframe:  Long-Range Goal Priority:  High Start Date: 07/31/21                            Expected End Date: 01/28/22                      Follow Up Date 10/28/21    LDL < 100 on Zetia   Why is this important?   These steps will help you keep on track with your medicines.   Notes:        Patient Care Plan: ccm pharmacy care plan     Problem Identified: HLD preDM smoking cessation OA Hypothyroidism hypoparathyroidism   Priority: High     Long-Range Goal: disease management   Start Date: 03/27/2021  Expected End Date: 03/27/2022  Recent Progress: On track  Priority: High  Note:    Current Barriers:  Unable to achieve control of smoking cessation    Pharmacist Clinical Goal(s):  Patient will achieve adherence to monitoring guidelines and medication adherence to achieve therapeutic efficacy through collaboration with PharmD and provider.   Interventions: 1:1 collaboration with Midge Minium, MD regarding development and update of comprehensive plan of care as evidenced by provider attestation and co-signature Inter-disciplinary care team collaboration (see longitudinal plan of care) Comprehensive medication review performed; medication list updated in electronic medical record  Hyperlipidemia: (LDL goal < 100) 02/14/22 -Uncontrolled -Current treatment: Zetia '10mg'$  daily Appropriate, Query effective, -Medications previously tried: atorvastatin  -Side effect review - could not tolerate statins - she is fine with just Zetia -Educated on Cholesterol goals;  Benefits of statin for ASCVD risk reduction; Importance of limiting foods high in cholesterol; -Recommended to continue current medication -LDL increased considerably when she stopped statin.  However HDL ratio is good and patient does not wish to start a statin. Continue routine screenings    Update 07/31/21 She switched from Crestor to Zetia due to body aches. She has not had lipid panel since this switch.  Previous lipid panels while she was on Crestor were adequately controlled. She is still active working out at [employer]. Has upcoming visit with PCP in April - recommend recheck lipids at this time. If increased - could consider resuming statin only a few times per week if she is agreeable. No changes until results from lipid panel.  Prediabetes (A1c goal <6.5%) -Controlled -Current medications: No current medications  -Medications previously tried: n/a  -Current home glucose readings fasting glucose: n/a post prandial glucose: n/a -Denies hypoglycemic/hyperglycemic symptoms -Current meal patterns: some processed foods, breads noted -Current exercise: goes to [employer] 3x/wk - water walking -Educated on A1c and blood sugar goals; -Counseled to check feet daily and get yearly eye exams -Recommended to continue current medication  Hypothyroidism (Goal: ensure appropriate use of levothyroxine) 02/14/22 -Controlled -Current treatment  Euthyrox 100 mcg once daily  Appropriate, Effective, Safe, Accessible -Reviewed administration - reports consistent use, no problems noted -Medications previously tried: n/a  -Recommended to continue current medication -TSH is normal, patient is adherent with no concerns at this time.  Smoking cessation (Goal: stop smoking) -Controlled, not assessed - patient was at a conference -time to first cig less than 30 minutes -pattern and emotional triggers -open to NRT options -  lozenge  -Current treatment  START Nicotine lozenge 4 mg - Weeks 1 to 6: 1 lozenge every 1 to 2 hours (maximum: 5 lozenges every 6 hours; 20 lozenges/day); to increase chances of quitting, use at least 9 lozenges/day during the first 6 weeks. Appropriate, Query effective,,  -Medications previously tried: may have tried oral NRT ~15 yrs ago   -Recommended to continue  current medication -Consider DEXA repeat - last 2016, BMI ~20 -Referral placed for Quitline [place]  Update 07/31/21 She is still reporting smoking about 1/2 ppd.  She did not start lozenges. After discussion today she has agreed to give the lozenges a try.  We also discussed using a jar for money to put in whatever she would typically spend on cigarettes.  This way she can visually see the money she would save by quitting. Will continue to pursue smoking cessation. CMA to follow up next month to determine if having success.  Patient Goals/Self-Care Activities Patient will:  - take medications as prescribed collaborate with provider on medication access solutions target a minimum of 150 minutes of moderate intensity exercise weekly engage in dietary modifications by reducing carbohydrate intake including bread, pasta, rice, crackers  Medication Assistance: None required.  Patient affirms current coverage meets needs.            The patient verbalized understanding of instructions, educational materials, and care plan provided today and DECLINED offer to receive copy of patient instructions, educational materials, and care plan.  Telephone follow up appointment with pharmacy team member scheduled for: 1 year  Edythe Clarity, [place], PharmD Clinical Pharmacist  [facility] [phone]

## 2022-03-31 ENCOUNTER — Encounter: Payer: Self-pay | Admitting: Family Medicine

## 2022-03-31 ENCOUNTER — Ambulatory Visit (INDEPENDENT_AMBULATORY_CARE_PROVIDER_SITE_OTHER): Payer: Medicare Other | Admitting: Family Medicine

## 2022-03-31 VITALS — BP 132/80 | HR 90 | Temp 98.1°F | Resp 16 | Ht 68.5 in | Wt 129.4 lb

## 2022-03-31 DIAGNOSIS — E785 Hyperlipidemia, unspecified: Secondary | ICD-10-CM

## 2022-03-31 DIAGNOSIS — Z0000 Encounter for general adult medical examination without abnormal findings: Secondary | ICD-10-CM | POA: Diagnosis not present

## 2022-03-31 DIAGNOSIS — E209 Hypoparathyroidism, unspecified: Secondary | ICD-10-CM

## 2022-03-31 LAB — HEPATIC FUNCTION PANEL
ALT: 13 U/L (ref 0–35)
AST: 24 U/L (ref 0–37)
Albumin: 4.5 g/dL (ref 3.5–5.2)
Alkaline Phosphatase: 47 U/L (ref 39–117)
Bilirubin, Direct: 0.1 mg/dL (ref 0.0–0.3)
Total Bilirubin: 0.7 mg/dL (ref 0.2–1.2)
Total Protein: 8.2 g/dL (ref 6.0–8.3)

## 2022-03-31 LAB — CBC WITH DIFFERENTIAL/PLATELET
Basophils Absolute: 0 10*3/uL (ref 0.0–0.1)
Basophils Relative: 0.9 % (ref 0.0–3.0)
Eosinophils Absolute: 0.1 10*3/uL (ref 0.0–0.7)
Eosinophils Relative: 1.5 % (ref 0.0–5.0)
HCT: 39.4 % (ref 36.0–46.0)
Hemoglobin: 13 g/dL (ref 12.0–15.0)
Lymphocytes Relative: 45.7 % (ref 12.0–46.0)
Lymphs Abs: 2.5 10*3/uL (ref 0.7–4.0)
MCHC: 33 g/dL (ref 30.0–36.0)
MCV: 92.9 fl (ref 78.0–100.0)
Monocytes Absolute: 0.6 10*3/uL (ref 0.1–1.0)
Monocytes Relative: 11 % (ref 3.0–12.0)
Neutro Abs: 2.2 10*3/uL (ref 1.4–7.7)
Neutrophils Relative %: 40.9 % — ABNORMAL LOW (ref 43.0–77.0)
Platelets: 292 10*3/uL (ref 150.0–400.0)
RBC: 4.24 Mil/uL (ref 3.87–5.11)
RDW: 14.7 % (ref 11.5–15.5)
WBC: 5.4 10*3/uL (ref 4.0–10.5)

## 2022-03-31 LAB — BASIC METABOLIC PANEL
BUN: 16 mg/dL (ref 6–23)
CO2: 29 mEq/L (ref 19–32)
Calcium: 9.4 mg/dL (ref 8.4–10.5)
Chloride: 101 mEq/L (ref 96–112)
Creatinine, Ser: 0.97 mg/dL (ref 0.40–1.20)
GFR: 60.63 mL/min (ref >60.00)
Glucose, Bld: 76 mg/dL (ref 70–99)
Potassium: 4.2 mEq/L (ref 3.5–5.1)
Sodium: 139 mEq/L (ref 135–145)

## 2022-03-31 LAB — LIPID PANEL
Cholesterol: 227 mg/dL — ABNORMAL HIGH (ref 0–200)
HDL: 93.7 mg/dL (ref >39.00)
LDL Cholesterol: 120 mg/dL — ABNORMAL HIGH (ref 0–99)
NonHDL: 133.17
Total CHOL/HDL Ratio: 2
Triglycerides: 65 mg/dL (ref 0.0–149.0)
VLDL: 13 mg/dL (ref 0.0–40.0)

## 2022-03-31 LAB — VITAMIN D 25 HYDROXY (VIT D DEFICIENCY, FRACTURES): VITD: 87.88 ng/mL (ref 30.00–100.00)

## 2022-03-31 LAB — TSH: TSH: 2.11 u[IU]/mL (ref 0.35–5.50)

## 2022-03-31 NOTE — Patient Instructions (Addendum)
Follow up in 6 months to recheck cholesterol We'll notify you of your lab results and make any changes if needed Continue to eat regularly! Call and schedule your mammogram Call with any questions or concerns Stay Safe!  Stay Healthy! Happy Fall!!!

## 2022-03-31 NOTE — Progress Notes (Signed)
   Subjective:    Patient ID: Tricia Woodward, female    DOB: Nov 01, 1955, 66 y.o.   MRN: [id number]  HPI CPE- UTD on colonoscopy, Tdap.  Declines flu, PNA.  Due for mammo- pt plans to schedule  Patient Care Team    Relationship Specialty Notifications Start End  Sheliah Hatch, MD PCP - General Family Medicine  09/07/13   Romero Belling, MD (Inactive) Consulting Physician Endocrinology  06/27/19   Erroll Luna, [facility]  Pharmacist  07/31/21    Comment: [phone]     Health Maintenance  Topic Date Due   MAMMOGRAM  01/04/2022   Zoster Vaccines- Shingrix (1 of 2) 07/01/2022 (Originally 02/12/1974)   INFLUENZA VACCINE  09/15/2022 (Originally 01/14/2022)   Pneumonia Vaccine 81+ Years old (1 - PCV) 04/01/2023 (Originally 02/12/1961)   COLONOSCOPY (Pts 45-73yrs Insurance coverage will need to be confirmed)  02/07/2026   TETANUS/TDAP  09/26/2030   DEXA SCAN  Completed   Hepatitis C Screening  Completed   HPV VACCINES  Aged Out   COVID-19 Vaccine  Discontinued      Review of Systems Patient reports no vision/ hearing changes, adenopathy,fever, weight change,  persistant/recurrent hoarseness , swallowing issues, chest pain, palpitations, edema, persistant/recurrent cough, hemoptysis, dyspnea (rest/exertional/paroxysmal nocturnal), gastrointestinal bleeding (melena, rectal bleeding), abdominal pain, significant heartburn, bowel changes, GU symptoms (dysuria, hematuria, incontinence), Gyn symptoms (abnormal  bleeding, pain),  syncope, focal weakness, memory loss, skin/hair/nail changes, abnormal bruising or bleeding.   + numbness of L leg due to chronic back pain    Objective:   Physical Exam General Appearance:    Alert, cooperative, no distress, appears stated age  Head:    Normocephalic, without obvious abnormality, atraumatic  Eyes:    PERRL, conjunctiva/corneas clear, EOM's intact both eyes  Ears:    Normal TM's and external ear canals, both ears  Nose:   Nares normal, septum  midline, mucosa normal, no drainage    or sinus tenderness  Throat:   Lips, mucosa, and tongue normal; teeth and gums normal  Neck:   Supple, symmetrical, trachea midline, no adenopathy;    Thyroid: no enlargement/tenderness/nodules  Back:     Symmetric, no curvature, ROM normal, no CVA tenderness  Lungs:     Clear to auscultation bilaterally, respirations unlabored  Chest Wall:    No tenderness or deformity   Heart:    Regular rate and rhythm, S1 and S2 normal, no murmur, rub   or gallop  Breast Exam:    Deferred to GYN  Abdomen:     Soft, non-tender, bowel sounds active all four quadrants,    no masses, no organomegaly  Genitalia:    Deferred to GYN  Rectal:    Extremities:   Extremities normal, atraumatic, no cyanosis or edema  Pulses:   2+ and symmetric all extremities  Skin:   Skin color, texture, turgor normal, no rashes or lesions  Lymph nodes:   Cervical, supraclavicular, and axillary nodes normal  Neurologic:   CNII-XII intact, normal strength, sensation and reflexes    throughout          Assessment & Plan:

## 2022-04-01 ENCOUNTER — Other Ambulatory Visit: Payer: Self-pay | Admitting: Family Medicine

## 2022-04-01 DIAGNOSIS — M5134 Other intervertebral disc degeneration, thoracic region: Secondary | ICD-10-CM | POA: Diagnosis not present

## 2022-04-01 DIAGNOSIS — M9901 Segmental and somatic dysfunction of cervical region: Secondary | ICD-10-CM | POA: Diagnosis not present

## 2022-04-01 DIAGNOSIS — M9904 Segmental and somatic dysfunction of sacral region: Secondary | ICD-10-CM | POA: Diagnosis not present

## 2022-04-01 DIAGNOSIS — M9903 Segmental and somatic dysfunction of lumbar region: Secondary | ICD-10-CM | POA: Diagnosis not present

## 2022-04-01 DIAGNOSIS — M5136 Other intervertebral disc degeneration, lumbar region: Secondary | ICD-10-CM | POA: Diagnosis not present

## 2022-04-01 DIAGNOSIS — M5032 Other cervical disc degeneration, mid-cervical region, unspecified level: Secondary | ICD-10-CM | POA: Diagnosis not present

## 2022-04-01 DIAGNOSIS — M5386 Other specified dorsopathies, lumbar region: Secondary | ICD-10-CM | POA: Diagnosis not present

## 2022-04-01 DIAGNOSIS — Z1231 Encounter for screening mammogram for malignant neoplasm of breast: Secondary | ICD-10-CM

## 2022-04-01 DIAGNOSIS — M9902 Segmental and somatic dysfunction of thoracic region: Secondary | ICD-10-CM | POA: Diagnosis not present

## 2022-04-01 LAB — PARATHYROID HORMONE, INTACT (NO CA): PTH: <6 pg/mL — ABNORMAL LOW (ref 16–77)

## 2022-04-01 NOTE — Assessment & Plan Note (Signed)
Chronic problem.  On Zetia w/o difficulty.  Check labs.  Adjust meds prn  

## 2022-04-01 NOTE — Assessment & Plan Note (Signed)
Check Vit D, PTH, and Ca

## 2022-04-01 NOTE — Progress Notes (Signed)
Informed pt of lab results  

## 2022-04-01 NOTE — Assessment & Plan Note (Signed)
Pt's PE WNL and unchanged from previous.  UTD on colonoscopy.  UTD on Tdap.  Declines flu and PNA vaccines.  Due for mammo- received letter and plans to call.  Check labs.  Anticipatory guidance provided.

## 2022-04-03 ENCOUNTER — Telehealth: Payer: Self-pay | Admitting: Family Medicine

## 2022-04-03 NOTE — Telephone Encounter (Signed)
I called pt again no answer left yet another VM stating her labs remain low and to f/u with Endocrinology per Dr Birdie Riddle

## 2022-04-03 NOTE — Telephone Encounter (Signed)
Patient was informed of her lab results.

## 2022-04-03 NOTE — Progress Notes (Signed)
Left pt a Vm stating the results

## 2022-04-03 NOTE — Telephone Encounter (Signed)
This was left under the lab results because I called her and I did leave her a VM about her lab results as well

## 2022-04-03 NOTE — Telephone Encounter (Addendum)
Caller name: Voula Waln   On DPR? :yes/no: Yes  Call back number: [phone]  Provider they see: Birdie Riddle   Reason for call:  Pt stated that she is returning a phone call from our office. Pt also stated that the person that called didn't leave a message. I didn't see any notes about this call either.

## 2022-04-04 NOTE — Telephone Encounter (Signed)
I spoke to the pt and I advised her that I did leave her a VM yesterday stating her PTH was stable but remained low and she needs to keep her apt with Endocrinology . Pt was calm and understanding and states she was getting told different things and she was only wanting to talk to me from now on .

## 2022-04-04 NOTE — Telephone Encounter (Signed)
Pt called stating that she is returning a phone call from [facility].

## 2022-04-04 NOTE — Telephone Encounter (Signed)
Patient upset called back stating she got a call from [facility] and is getting mixed messages stating labs are stable but low and phone calls have stated labs normal I tried to explain it was a call about lab results asked that you call her back anyways

## 2022-04-10 ENCOUNTER — Ambulatory Visit (INDEPENDENT_AMBULATORY_CARE_PROVIDER_SITE_OTHER): Payer: Medicare Other | Admitting: Internal Medicine

## 2022-04-10 ENCOUNTER — Encounter: Payer: Self-pay | Admitting: Internal Medicine

## 2022-04-10 VITALS — BP 124/76 | HR 95 | Ht 68.5 in | Wt 128.0 lb

## 2022-04-10 DIAGNOSIS — E890 Postprocedural hypothyroidism: Secondary | ICD-10-CM

## 2022-04-10 DIAGNOSIS — E209 Hypoparathyroidism, unspecified: Secondary | ICD-10-CM | POA: Diagnosis not present

## 2022-04-10 NOTE — Patient Instructions (Addendum)
Decrease vitamin D3 to 1000 iu daily or 2000 iu every other day  Continue Levothyroxine 112 mcg daily  Continue calcitriol 0.5 mcg, 2 capsules daily Continue multivitamin, 1 tablet daily

## 2022-04-10 NOTE — Progress Notes (Signed)
Name: Tricia Woodward  MRN/ DOB: [id number], 1956/05/15    Age/ Sex: 66 y.o., female     PCP: Midge Minium, MD   Reason for Endocrinology Evaluation: Postoperative hypothyroidism/postoperative hypoparathyroidism     Initial Endocrinology Clinic Visit: 10/11/2013    PATIENT IDENTIFIER: Tricia Woodward is a 66 y.o., female with a past medical history of postoperative hypothyroidism and hypoparathyroidism . She has followed with [facility] since 10/11/2013 for consultative assistance with management of her  postoperative hypothyroidism and hypoparathyroidism.     HISTORICAL SUMMARY:   She is status post total thyroidectomy in 1970s due to benign reasons.  Following her thyroidectomy she has been noted with hypocalcemia and vitamin D deficiency.  She has been on calcitriol, and ergocalciferol She was on Forteo at some point but this was declined by insurance She was also prescribed Natpara but this was recalled     SUBJECTIVE:     Today (04/10/2022):  Tricia Woodward is here for a follow-up on postoperative hypothyroidism and hypoparathyroidism.    Tricia Woodward has been decreasing  She continues with aches and pains , has carpal tunnel to the left hand  She continues with low appetite  She continues with fatigue which is seasonal  Denies constipation    She has been taking 5000 iu every other day     ENDOCRINE HOME MEDICATIONS: Calcitriol 0.5 mcg , 2 caps daily  Vitamin D3 2000 iu ,daily Levothyroxine 112 mcg daily MVI daily     HISTORY:  Past Medical History:  Past Medical History:  Diagnosis Date   Allergy    Arthritis    COVID-19 virus infection    Frequent falls    Heart murmur    Hemorrhoids    past hx    History of hepatitis C    treated with meds x 3 months   Hyperlipidemia    Low blood sugar    Thyroid disease    parahypothyroid disease   Past Surgical History:  Past Surgical History:  Procedure Laterality Date    ABDOMINAL HYSTERECTOMY     partial   BREAST CYST EXCISION Bilateral 1983   BUNIONECTOMY     CARPAL TUNNEL RELEASE Left 2017   COLONOSCOPY     DENTAL SURGERY     POLYPECTOMY     THYROID SURGERY Bilateral 1970   Social History:  reports that she has been smoking cigarettes. She started smoking about 43 years ago. She has been smoking an average of .2 packs per day. She has never used smokeless tobacco. She reports current alcohol use of about 5.0 standard drinks of alcohol per week. She reports that she does not use drugs. Family History:  Family History  Problem Relation Age of Onset   Cirrhosis Mother    Hypertension Father    Hypertension Sister    Arthritis Other    Hyperlipidemia Other    Aneurysm Other    Colon cancer Neg Hx    Esophageal cancer Neg Hx    Rectal cancer Neg Hx    Stomach cancer Neg Hx    Breast cancer Neg Hx    Colon polyps Neg Hx      HOME MEDICATIONS: Allergies as of 04/10/2022       Reactions   Penicillins    REACTION: Yeast Infections        Medication List        Accurate as of April 10, 2022 11:07 AM. If you have any questions, ask  your nurse or doctor.          aspirin EC 81 MG tablet Take 1 tablet (81 mg total) by mouth daily. Swallow whole.   calcitRIOL 0.5 MCG capsule Commonly known as: ROCALTROL Take 2 capsules (1 mcg total) by mouth daily.   cholecalciferol 25 MCG (1000 UNIT) tablet Commonly known as: VITAMIN D3 Take 2 tablets (2,000 Units total) by mouth daily.   clotrimazole-betamethasone cream Commonly known as: LOTRISONE APPLY 1 APPLICATION TOPICALLY 2 TIMES DAILY TO FOOT RASH   cyanocobalamin 1000 MCG tablet Commonly known as: VITAMIN B12 Take 1,000 mcg by mouth daily.   diclofenac Sodium 1 % Gel Commonly known as: VOLTAREN APPLY 2 G TOPICALLY 4 TIMES DAILY   ezetimibe 10 MG tablet Commonly known as: Zetia Take 1 tablet (10 mg total) by mouth daily.   fish oil-omega-3 fatty acids 1000 MG capsule Take 2  g by mouth daily.   levothyroxine 112 MCG tablet Commonly known as: SYNTHROID Take 1 tablet (112 mcg total) by mouth daily.   multivitamin tablet Take 1 tablet by mouth daily.   traMADol 50 MG tablet Commonly known as: ULTRAM Take 1 tablet (50 mg total) by mouth 2 (two) times daily as needed.   TURMERIC PO Take by mouth.          OBJECTIVE:   PHYSICAL EXAM: VS: BP 124/76 (BP Location: Left Arm, Patient Position: Sitting, Cuff Size: Small)   Pulse 95   Ht 5' 8.5" (1.74 m)   Wt 128 lb (58.1 kg)   SpO2 99%   BMI 19.18 kg/m    EXAM: General: Pt appears well and is in NAD  Neck: General: Supple without adenopathy. Thyroid:  No goiter or nodules appreciated.   Lungs: Clear with good BS bilat with no rales, rhonchi, or wheezes  Heart: Auscultation: RRR.  Abdomen: Normoactive bowel sounds, soft, nontender, without masses or organomegaly palpable  Extremities:  BL LE: No pretibial edema normal ROM and strength.  Mental Status: Judgment, insight: Intact Orientation: Oriented to time, place, and person Mood and affect: No depression, anxiety, or agitation     DATA REVIEWED:   Latest Reference Range & Units 03/31/22 13:13  Sodium 135 - 145 mEq/L 139  Potassium 3.5 - 5.1 mEq/L 4.2  Chloride 96 - 112 mEq/L 101  CO2 19 - 32 mEq/L 29  Glucose 70 - 99 mg/dL 76  BUN 6 - 23 mg/dL 16  Creatinine 0.40 - 1.20 mg/dL 0.97  Calcium 8.4 - 10.5 mg/dL 9.4  Alkaline Phosphatase 39 - 117 U/L 47  Albumin 3.5 - 5.2 g/dL 4.5  AST 0 - 37 U/L 24  ALT 0 - 35 U/L 13  Total Protein 6.0 - 8.3 g/dL 8.2  Bilirubin, Direct 0.0 - 0.3 mg/dL 0.1  Total Bilirubin 0.2 - 1.2 mg/dL 0.7  GFR >60.00 mL/min 60.63    Latest Reference Range & Units 03/31/22 13:13  VITD 30.00 - 100.00 ng/mL 87.88     ASSESSMENT / PLAN / RECOMMENDATIONS:   Postoperative hypothyroidism:  -I have reviewed her most recent labs that were done at PCPs office, I have assured the patient that her TSH is at goal and  there is no need to change her levothyroxine dose - Pt educated extensively on the correct way to take levothyroxine (first thing in the morning with water, 30 minutes before eating or taking other medications). - Pt encouraged to double dose the following day if she were to miss a dose given long half-life  of levothyroxine. -Somehow she thought this appointment was made by her PCP on an urgent basis, but I explained to the patient that this is a regular follow-up visit for her that was scheduled in June 2023    Medications   Continue  levothyroxine 112 mcg daily  2.  Postoperative hypoparathyroidism:  -She was  on Natpara until it was recalled, she was very satisfied with that -Her serum calcium and vitamin D are normal, but her vitamin D has increased we did discuss the risk of vitamin D toxicity, the patient has increased her vitamin D to 5000 and has been taking it every other day -The advised to decrease vitamin D as below  Medication Continue calcitriol 0.5 mcg, 2 caps daily Continue multivitamin, 1 tablet daily Continue vitamin D3 2000 IU every other day (or 1000 IU daily)  Follow-up in 6-12 months   Signed electronically by: Mack Guise, MD  [facility]  [address] [place], [place] [address] Phone: [phone] FAX: [phone]      CC: Midge Minium, [address] [place] [address] Phone: [phone]  Fax: [phone]   Return to Endocrinology clinic as below: Future Appointments  Date Time Provider [place]  04/10/2022 11:10 AM Tyauna Lacaze, Melanie Crazier, MD [facility] None  05/19/2022 12:00 PM [facility] MM 3 [facility]  10/03/2022 11:00 AM Midge Minium, MD [facility]

## 2022-04-11 MED ORDER — CALCITRIOL 0.5 MCG PO CAPS: 1.0000 ug | ORAL_CAPSULE | Freq: Every day | ORAL | 3 refills | Status: AC

## 2022-04-11 MED ORDER — LEVOTHYROXINE SODIUM 112 MCG PO TABS
112.0000 ug | ORAL_TABLET | Freq: Every day | ORAL | 3 refills | Status: DC
Start: 2022-04-11 — End: 2024-05-04

## 2022-04-21 ENCOUNTER — Encounter: Payer: Self-pay | Admitting: Family Medicine

## 2022-04-21 DIAGNOSIS — E890 Postprocedural hypothyroidism: Secondary | ICD-10-CM

## 2022-04-21 DIAGNOSIS — E209 Hypoparathyroidism, unspecified: Secondary | ICD-10-CM

## 2022-05-12 ENCOUNTER — Encounter: Payer: Self-pay | Admitting: Internal Medicine

## 2022-05-13 ENCOUNTER — Telehealth: Payer: Self-pay

## 2022-05-13 ENCOUNTER — Telehealth: Payer: Self-pay | Admitting: Internal Medicine

## 2022-05-13 NOTE — Telephone Encounter (Signed)
Patient is okay with seeing new Endo at [facility] notes she is extremely upset when she called noting she doesn't understand why she was dismissed from them she feels it is discrimination and that she she hasn't done anything to have deserved this and notes the office manager declined her call. Notes she can no longer see information from the Endo office on her MyChart patient was very distressed and wanted someone to help told her I would forward this information to Dr Birdie Riddle and Tax inspector on her behalf.

## 2022-05-13 NOTE — Telephone Encounter (Signed)
Or send referral to patient to another endocrinology practice.

## 2022-05-13 NOTE — Telephone Encounter (Signed)
Patient called today and requested to speak with the Practice Administrator.  Patient MRN and number given to Engineer, building services.

## 2022-05-13 NOTE — Telephone Encounter (Signed)
Patient had a referral sent to Dr Cruzita Lederer on 04/24/22.  Dr Cruzita Lederer is not currently taking new patients nor internal patient transfers.  Called and spoke with Eli Phillips  at [facility] to advise of this situation and the need to transfer

## 2022-05-13 NOTE — Telephone Encounter (Signed)
Patient is wanting to know why she was dismissed and she wants to schedule an appointment with Dr. Cruzita Lederer.

## 2022-05-13 NOTE — Telephone Encounter (Signed)
Spoke with Belenda Cruise  at [facility] and she advised that the referral has not been approved to see Tricia Woodward by DR Renne Crigler . DR Renne Crigler is not taking any new pts at this time . Pt was seeing DR Shamleffer . Pt did want to see her anymore per [facility] .  Belenda Cruise states the pt will need a different Endo office to be seen . She suggested [facility] has two Endocrinologist that is accepting new patients.  I left the pt a VM asking her to return my call in regards to this matter

## 2022-05-14 ENCOUNTER — Other Ambulatory Visit: Payer: Self-pay

## 2022-05-14 ENCOUNTER — Telehealth: Payer: Self-pay

## 2022-05-14 DIAGNOSIS — E209 Hypoparathyroidism, unspecified: Secondary | ICD-10-CM

## 2022-05-14 NOTE — Telephone Encounter (Signed)
Pt called and left a vm requesting an appt to see Dr Cruzita Lederer, and to get an explanation on why she was banned from the practice. Pt also states she does not have access to her Mychart. Pt was contacted and advised again, Dr Cruzita Lederer is not currently taking any new pt. Even with the referral that was sent. Pt wanted to know why her f\u apt with Dr Kelton Pillar was cancelled if she could not get in to see Dr Cruzita Lederer at this time. I advised pt she would have to address her concerns with the practice administrator.

## 2022-05-14 NOTE — Telephone Encounter (Signed)
Unfortunately I don't have any control over the Endo practice, their providers, or their management.  But I can refer her to Endo at [facility] (dx hypoparathyroid) and hopefully she will have a much better experience.

## 2022-05-14 NOTE — Telephone Encounter (Signed)
[facility] called back and stated they do not take this patients insurance

## 2022-05-14 NOTE — Telephone Encounter (Signed)
Weird that [facility] doesn't take [employer].  We can refer to [facility] in [place]

## 2022-05-14 NOTE — Telephone Encounter (Signed)
Tricia Woodward states that [facility] does not take pt insurance .would you like to send her to another Endocrinologist

## 2022-05-14 NOTE — Telephone Encounter (Signed)
I have spoken w/ the pt . I explained my conversation with Belenda Cruise at [facility] yesterday and the reason she could not be seen there. I did tell the pt that [facility] was accepting new pt for there 2 Endocrinologist and she is willing to go . Dr Randel Books gave the ok for the referral and I have placed that as well. Advised pt to give at least two weeks to hear anything but could take longer due to there specialty . She expressed verbal understanding

## 2022-05-14 NOTE — Telephone Encounter (Signed)
I have placed a new referral for pt . Dr Birdie Riddle is suggesting [facility] in [facility] .

## 2022-05-15 ENCOUNTER — Telehealth: Payer: Self-pay | Admitting: Internal Medicine

## 2022-05-15 NOTE — Telephone Encounter (Signed)
Per patients request, removed Vivia Ewing, MD as her Endocrinologist. 05/12/22.

## 2022-05-19 ENCOUNTER — Ambulatory Visit
Admission: RE | Admit: 2022-05-19 | Discharge: 2022-05-19 | Disposition: A | Discharge status: Home / Self Care | Payer: Medicare Other | Source: Ambulatory Visit | Attending: Family Medicine | Admitting: Family Medicine

## 2022-05-19 DIAGNOSIS — Z1231 Encounter for screening mammogram for malignant neoplasm of breast: Secondary | ICD-10-CM

## 2022-06-05 ENCOUNTER — Telehealth: Payer: Self-pay | Admitting: Family Medicine

## 2022-06-05 NOTE — Telephone Encounter (Signed)
error 

## 2022-06-14 ENCOUNTER — Other Ambulatory Visit: Payer: Self-pay | Admitting: Family Medicine

## 2022-08-25 DIAGNOSIS — H905 Unspecified sensorineural hearing loss: Secondary | ICD-10-CM | POA: Diagnosis not present

## 2022-08-28 ENCOUNTER — Encounter: Payer: Self-pay | Admitting: Family Medicine

## 2022-09-10 ENCOUNTER — Other Ambulatory Visit: Payer: Self-pay | Admitting: Family Medicine

## 2022-09-24 DIAGNOSIS — E892 Postprocedural hypoparathyroidism: Secondary | ICD-10-CM | POA: Diagnosis not present

## 2022-09-24 DIAGNOSIS — E890 Postprocedural hypothyroidism: Secondary | ICD-10-CM | POA: Diagnosis not present

## 2022-09-24 DIAGNOSIS — Z780 Asymptomatic menopausal state: Secondary | ICD-10-CM | POA: Diagnosis not present

## 2022-09-24 DIAGNOSIS — E7800 Pure hypercholesterolemia, unspecified: Secondary | ICD-10-CM | POA: Diagnosis not present

## 2022-09-24 DIAGNOSIS — R7303 Prediabetes: Secondary | ICD-10-CM | POA: Diagnosis not present

## 2022-09-26 DIAGNOSIS — H527 Unspecified disorder of refraction: Secondary | ICD-10-CM | POA: Diagnosis not present

## 2022-10-03 ENCOUNTER — Ambulatory Visit: Payer: 59 | Admitting: Family Medicine

## 2022-10-13 ENCOUNTER — Ambulatory Visit: Payer: Medicare Other | Admitting: Internal Medicine

## 2022-10-14 ENCOUNTER — Other Ambulatory Visit: Payer: Self-pay | Admitting: Family Medicine

## 2022-10-14 NOTE — Telephone Encounter (Signed)
Tramadol 50 mg LOV: 03/31/22 Last Refill:10/03/22 Upcoming appt: 10/15/22

## 2022-10-15 ENCOUNTER — Ambulatory Visit: Payer: 59 | Admitting: Family Medicine

## 2022-10-20 ENCOUNTER — Encounter: Payer: Self-pay | Admitting: Family Medicine

## 2022-10-20 ENCOUNTER — Other Ambulatory Visit: Payer: Self-pay

## 2022-10-20 ENCOUNTER — Ambulatory Visit (INDEPENDENT_AMBULATORY_CARE_PROVIDER_SITE_OTHER): Payer: 59 | Admitting: Family Medicine

## 2022-10-20 VITALS — BP 110/70 | HR 78 | Temp 97.9°F | Resp 17 | Ht 68.5 in | Wt 130.1 lb

## 2022-10-20 DIAGNOSIS — R7303 Prediabetes: Secondary | ICD-10-CM

## 2022-10-20 DIAGNOSIS — E209 Hypoparathyroidism, unspecified: Secondary | ICD-10-CM

## 2022-10-20 DIAGNOSIS — R4589 Other symptoms and signs involving emotional state: Secondary | ICD-10-CM | POA: Diagnosis not present

## 2022-10-20 DIAGNOSIS — E890 Postprocedural hypothyroidism: Secondary | ICD-10-CM | POA: Diagnosis not present

## 2022-10-20 DIAGNOSIS — E538 Deficiency of other specified B group vitamins: Secondary | ICD-10-CM

## 2022-10-20 DIAGNOSIS — Z789 Other specified health status: Secondary | ICD-10-CM | POA: Diagnosis not present

## 2022-10-20 DIAGNOSIS — E785 Hyperlipidemia, unspecified: Secondary | ICD-10-CM | POA: Diagnosis not present

## 2022-10-20 MED ORDER — TRAMADOL HCL 50 MG PO TABS: 50.0000 mg | ORAL_TABLET | Freq: Two times a day (BID) | ORAL | 0 refills | Status: DC | PRN

## 2022-10-20 NOTE — Assessment & Plan Note (Signed)
Check labs and replete prn. 

## 2022-10-20 NOTE — Assessment & Plan Note (Signed)
Chronic problem.  Check labs due to fatigue and replete Vit D prn.

## 2022-10-20 NOTE — Progress Notes (Signed)
   Subjective:    Patient ID: Tricia Woodward, female    DOB: Oct 29, 1955, 67 y.o.   MRN: [id number]  HPI Hyperlipidemia- chronic problem.  Pt is intolerant to statins.  On Zetia 10mg  daily.  Last LDL 120.  No CP, SOB, abd pain, N/V.  Hypothyroid- currently on Levothyroxine daily.  + fatigue- particularly after eating.  Denies changes to skin/hair/nails  'i feel a little sad right now'- pt feels it may be related to weather.  Pt has been on medication in the past and is not interested in meds at this time.  Prefers to pray and work through it on her own.   Review of Systems For ROS see HPI     Objective:   Physical Exam Vitals reviewed.  Constitutional:      General: She is not in acute distress.    Appearance: She is well-developed.  HENT:     Head: Normocephalic and atraumatic.  Eyes:     Conjunctiva/sclera: Conjunctivae normal.     Pupils: Pupils are equal, round, and reactive to light.  Neck:     Thyroid: No thyromegaly.  Cardiovascular:     Rate and Rhythm: Normal rate and regular rhythm.     Pulses: Normal pulses.     Heart sounds: Normal heart sounds. No murmur heard. Pulmonary:     Effort: Pulmonary effort is normal. No respiratory distress.     Breath sounds: Normal breath sounds.  Abdominal:     General: There is no distension.     Palpations: Abdomen is soft.     Tenderness: There is no abdominal tenderness.  Musculoskeletal:     Cervical back: Normal range of motion and neck supple.     Right lower leg: No edema.     Left lower leg: No edema.  Lymphadenopathy:     Cervical: No cervical adenopathy.  Skin:    General: Skin is warm and dry.  Neurological:     General: No focal deficit present.     Mental Status: She is alert and oriented to person, place, and time.  Psychiatric:        Mood and Affect: Mood normal.        Behavior: Behavior normal.        Thought Content: Thought content normal.           Assessment & Plan:

## 2022-10-20 NOTE — Telephone Encounter (Signed)
Patient is requesting a refill of the following medications: Requested Prescriptions   Pending Prescriptions Disp Refills   traMADol (ULTRAM) 50 MG tablet 30 tablet 0    Sig: Take 1 tablet (50 mg total) by mouth 2 (two) times daily as needed.    Date of patient request: 10/20/2022 Last office visit: 10/20/22 Date of last refill: 10/02/21 Last refill amount: 30

## 2022-10-20 NOTE — Assessment & Plan Note (Signed)
Currently on Zetia w/o difficulty

## 2022-10-20 NOTE — Assessment & Plan Note (Signed)
New.  Pt has hx of depression and was on meds in the past but prefers to avoid medication at this time and continue to work on prayer.  Will follow.

## 2022-10-20 NOTE — Patient Instructions (Signed)
Schedule your complete physical in 6 months We'll notify you of your lab results and make any changes if needed Make sure you are eating regularly throughout the day Call with any questions or concerns Stay Safe!  Stay Healthy! Have a great summer!!!

## 2022-10-20 NOTE — Assessment & Plan Note (Signed)
+   fatigue and sadness.  May be mood related rather than thyroid.  Currently on Levothyroxine daily.  Check labs.  Adjust meds prn

## 2022-10-20 NOTE — Assessment & Plan Note (Signed)
Check A1C 

## 2022-10-20 NOTE — Assessment & Plan Note (Signed)
Chronic problem.  Pt is intolerant to statins and is on Zetia 10mg  daily w/o difficulty.  Check labs.  Adjust meds prn

## 2022-10-21 LAB — CBC WITH DIFFERENTIAL/PLATELET
Basophils Absolute: 0.1 10*3/uL (ref 0.0–0.1)
Basophils Relative: 1 % (ref 0.0–3.0)
Eosinophils Absolute: 0.1 10*3/uL (ref 0.0–0.7)
Eosinophils Relative: 1.4 % (ref 0.0–5.0)
HCT: 38.5 % (ref 36.0–46.0)
Hemoglobin: 12.7 g/dL (ref 12.0–15.0)
Lymphocytes Relative: 51.5 % — ABNORMAL HIGH (ref 12.0–46.0)
Lymphs Abs: 2.6 10*3/uL (ref 0.7–4.0)
MCHC: 33 g/dL (ref 30.0–36.0)
MCV: 93.7 fl (ref 78.0–100.0)
Monocytes Absolute: 0.5 10*3/uL (ref 0.1–1.0)
Monocytes Relative: 10.4 % (ref 3.0–12.0)
Neutro Abs: 1.8 10*3/uL (ref 1.4–7.7)
Neutrophils Relative %: 35.7 % — ABNORMAL LOW (ref 43.0–77.0)
Platelets: 312 10*3/uL (ref 150.0–400.0)
RBC: 4.11 Mil/uL (ref 3.87–5.11)
RDW: 15 % (ref 11.5–15.5)
WBC: 5.1 10*3/uL (ref 4.0–10.5)

## 2022-10-21 LAB — HEPATIC FUNCTION PANEL
ALT: 15 U/L (ref 0–35)
AST: 27 U/L (ref 0–37)
Albumin: 4.3 g/dL (ref 3.5–5.2)
Alkaline Phosphatase: 41 U/L (ref 39–117)
Bilirubin, Direct: 0.1 mg/dL (ref 0.0–0.3)
Total Bilirubin: 0.5 mg/dL (ref 0.2–1.2)
Total Protein: 8.1 g/dL (ref 6.0–8.3)

## 2022-10-21 LAB — LIPID PANEL
Cholesterol: 234 mg/dL — ABNORMAL HIGH (ref 0–200)
HDL: 92.4 mg/dL (ref >39.00)
LDL Cholesterol: 129 mg/dL — ABNORMAL HIGH (ref 0–99)
NonHDL: 141.53
Total CHOL/HDL Ratio: 3
Triglycerides: 64 mg/dL (ref 0.0–149.0)
VLDL: 12.8 mg/dL (ref 0.0–40.0)

## 2022-10-21 LAB — BASIC METABOLIC PANEL
BUN: 14 mg/dL (ref 6–23)
CO2: 29 mEq/L (ref 19–32)
Calcium: 9.1 mg/dL (ref 8.4–10.5)
Chloride: 101 mEq/L (ref 96–112)
Creatinine, Ser: 0.82 mg/dL (ref 0.40–1.20)
GFR: 73.88 mL/min (ref >60.00)
Glucose, Bld: 58 mg/dL — ABNORMAL LOW (ref 70–99)
Potassium: 4.5 mEq/L (ref 3.5–5.1)
Sodium: 139 mEq/L (ref 135–145)

## 2022-10-21 LAB — VITAMIN B12: Vitamin B-12: 889 pg/mL (ref 211–911)

## 2022-10-21 LAB — TSH: TSH: 3.42 u[IU]/mL (ref 0.35–5.50)

## 2022-10-21 LAB — VITAMIN D 25 HYDROXY (VIT D DEFICIENCY, FRACTURES): VITD: 54.44 ng/mL (ref 30.00–100.00)

## 2022-10-21 LAB — HEMOGLOBIN A1C: Hgb A1c MFr Bld: 6.3 % (ref 4.6–6.5)

## 2022-10-22 ENCOUNTER — Telehealth: Payer: Self-pay

## 2022-10-22 NOTE — Telephone Encounter (Signed)
Left results on pt VM  

## 2022-10-22 NOTE — Telephone Encounter (Signed)
-----   Message from Sheliah Hatch, MD sent at 10/22/2022  7:29 AM EDT ----- Labs look good but your A1C (what we use to measure diabetes) is in the pre-diabetes range.  Please work on a low carb, low sugar diet to prevent this from climbing into the diabetes range

## 2022-11-08 ENCOUNTER — Encounter: Payer: Self-pay | Admitting: Family Medicine

## 2022-11-08 ENCOUNTER — Telehealth: Payer: 59 | Admitting: Family

## 2022-11-08 DIAGNOSIS — B370 Candidal stomatitis: Secondary | ICD-10-CM | POA: Diagnosis not present

## 2022-11-08 MED ORDER — LIDOCAINE VISCOUS HCL 2 % MT SOLN
5.0000 mL | Freq: Four times a day (QID) | ORAL | 2 refills | Status: DC | PRN
Start: 2022-11-08 — End: 2023-08-16

## 2022-11-08 NOTE — Progress Notes (Signed)
E-Visit for Mouth Ulcers  We are sorry that you are not feeling well.  Here is how we plan to help!  Based on what you have shared with me, it appears that you do have oral thrush.      I have sent a prescription for magic mouthwash to your pharmacy.   While the exact causes are unknown, some common causes and factors that may aggravate mouth ulcers include: Genetics - Sometimes mouth ulcers run in families High alcohol intake Acidic foods such as citrus fruits like pineapple, grapefruit, orange fruits/juices, may aggravate mouth ulcers Other foods high in acidity or spice such as coffee, chocolate, chips, pretzels, eggs, nuts, cheese Quitting smoking Injury caused by biting the tongue or inside of the cheek Diet lacking in B-12, zinc, folic acid or iron Female hormone shifts with menstruation Excessive fatigue, emotional stress or anxiety Prevention: Talk to your doctor if you are taking meds that are known to cause mouth ulcers such as:   Anti-inflammatory drugs (for example Ibuprofen, Naproxen sodium), pain killers, Beta blockers, Oral nicotine replacement drugs, Some street drugs (heroin).   Avoid allowing any tablets to dissolve in your mouth that are meant to swallowed whole Avoid foods/drinks that trigger or worsen symptoms Keep your mouth clean with daily brushing and flossing  Home Care: The goal with treatment is to ease the pain where ulcers occur and help them heal as quickly as possible.  There is no medical treatment to prevent mouth ulcers from coming back or recurring.  Avoid spicy and acidic foods Eat soft foods and avoid rough, crunchy foods Avoid chewing gum Do not use toothpaste that contains sodium lauryl sulphite Use a straw to drink which helps avoid liquids toughing the ulcers near the front of your mouth Use a very soft toothbrush If you have dentures or dental hardware that you feel is not fitting well or contributing to his, please see your dentist. Use  saltwater mouthwash which helps healing. Dissolve a  teaspoon of salt in a glass of warm water. Swish around your mouth and spit it out. This can be used as needed if it is soothing.   GET HELP RIGHT AWAY IF: Persistent ulcers require checking IN PERSON (face to face). Any mouth lesion lasting longer than a month should be seen by your DENTIST as soon as possible for evaluation for possible oral cancer. If you have a non-painful ulcer in 1 or more areas of your mouth Ulcers that are spreading, are very large or particularly painful Ulcers last longer than one week without improving on treatment If you develop a fever, swollen glands and begin to feel unwell Ulcers that developed after starting a new medication MAKE SURE YOU: Understand these instructions. Will watch your condition. Will get help right away if you are not doing well or get worse.  Thank you for choosing an e-visit.  Your e-visit answers were reviewed by a board certified advanced clinical practitioner to complete your personal care plan. Depending upon the condition, your plan could have included both over the counter or prescription medications.  Please review your pharmacy choice. Make sure the pharmacy is open so you can pick up prescription now. If there is a problem, you may contact your provider through [employer] and have the prescription routed to another pharmacy.  Your safety is important to [place]. If you have drug allergies check your prescription carefully.   For the next 24 hours you can use MyChart to ask questions about today's  visit, request a non-urgent call back, or ask for a work or school excuse. You will get an email in the next two days asking about your experience. I hope that your e-visit has been valuable and will speed your recovery.  Approximately 5 minutes was spent documenting and reviewing patient's chart.

## 2022-11-20 ENCOUNTER — Ambulatory Visit (INDEPENDENT_AMBULATORY_CARE_PROVIDER_SITE_OTHER): Payer: 59 | Admitting: *Deleted

## 2022-11-20 DIAGNOSIS — Z0000 Encounter for general adult medical examination without abnormal findings: Secondary | ICD-10-CM

## 2022-11-20 NOTE — Patient Instructions (Signed)
[name] , Thank you for taking time to come for your Medicare Wellness Visit. I appreciate your ongoing commitment to your health goals. Please review the following plan we discussed and let me know if I can assist you in the future.   Screening recommendations/referrals: Colonoscopy: up to date Mammogram: up to date Bone Density: Education provided Recommended yearly ophthalmology/optometry visit for glaucoma screening and checkup Recommended yearly dental visit for hygiene and checkup  Vaccinations: Influenza vaccine: - Pneumococcal vaccine: - Tdap vaccine: up to date Shingles vaccine: -    Advanced directives: Education provided      Preventive Care 65 Years and Older, Female Preventive care refers to lifestyle choices and visits with your health care provider that can promote health and wellness. What does preventive care include? A yearly physical exam. This is also called an annual well check. Dental exams once or twice a year. Routine eye exams. Ask your health care provider how often you should have your eyes checked. Personal lifestyle choices, including: Daily care of your teeth and gums. Regular physical activity. Eating a healthy diet. Avoiding tobacco and drug use. Limiting alcohol use. Practicing safe sex. Taking low-dose aspirin every day. Taking vitamin and mineral supplements as recommended by your health care provider. What happens during an annual well check? The services and screenings done by your health care provider during your annual well check will depend on your age, overall health, lifestyle risk factors, and family history of disease. Counseling  Your health care provider may ask you questions about your: Alcohol use. Tobacco use. Drug use. Emotional well-being. Home and relationship well-being. Sexual activity. Eating habits. History of falls. Memory and ability to understand (cognition). Work and work Astronomer. Reproductive  health. Screening  You may have the following tests or measurements: Height, weight, and BMI. Blood pressure. Lipid and cholesterol levels. These may be checked every 5 years, or more frequently if you are over 22 years old. Skin check. Lung cancer screening. You may have this screening every year starting at age 34 if you have a 30-pack-year history of smoking and currently smoke or have quit within the past 15 years. Fecal occult blood test (FOBT) of the stool. You may have this test every year starting at age 58. Flexible sigmoidoscopy or colonoscopy. You may have a sigmoidoscopy every 5 years or a colonoscopy every 10 years starting at age 41. Hepatitis C blood test. Hepatitis B blood test. Sexually transmitted disease (STD) testing. Diabetes screening. This is done by checking your blood sugar (glucose) after you have not eaten for a while (fasting). You may have this done every 1-3 years. Bone density scan. This is done to screen for osteoporosis. You may have this done starting at age 51. Mammogram. This may be done every 1-2 years. Talk to your health care provider about how often you should have regular mammograms. Talk with your health care provider about your test results, treatment options, and if necessary, the need for more tests. Vaccines  Your health care provider may recommend certain vaccines, such as: Influenza vaccine. This is recommended every year. Tetanus, diphtheria, and acellular pertussis (Tdap, Td) vaccine. You may need a Td booster every 10 years. Zoster vaccine. You may need this after age 17. Pneumococcal 13-valent conjugate (PCV13) vaccine. One dose is recommended after age 33. Pneumococcal polysaccharide (PPSV23) vaccine. One dose is recommended after age 69. Talk to your health care provider about which screenings and vaccines you need and how often you need them. This  information is not intended to replace advice given to you by your health care provider.  Make sure you discuss any questions you have with your health care provider. Document Released: 06/29/2015 Document Revised: 02/20/2016 Document Reviewed: 04/03/2015 Elsevier Interactive Patient Education  2017 [employer].  Fall Prevention in the Home Falls can cause injuries. They can happen to people of all ages. There are many things you can do to make your home safe and to help prevent falls. What can I do on the outside of my home? Regularly fix the edges of walkways and driveways and fix any cracks. Remove anything that might make you trip as you walk through a door, such as a raised step or threshold. Trim any bushes or trees on the path to your home. Use bright outdoor lighting. Clear any walking paths of anything that might make someone trip, such as rocks or tools. Regularly check to see if handrails are loose or broken. Make sure that both sides of any steps have handrails. Any raised decks and porches should have guardrails on the edges. Have any leaves, snow, or ice cleared regularly. Use sand or salt on walking paths during winter. Clean up any spills in your garage right away. This includes oil or grease spills. What can I do in the bathroom? Use night lights. Install grab bars by the toilet and in the tub and shower. Do not use towel bars as grab bars. Use non-skid mats or decals in the tub or shower. If you need to sit down in the shower, use a plastic, non-slip stool. Keep the floor dry. Clean up any water that spills on the floor as soon as it happens. Remove soap buildup in the tub or shower regularly. Attach bath mats securely with double-sided non-slip rug tape. Do not have throw rugs and other things on the floor that can make you trip. What can I do in the bedroom? Use night lights. Make sure that you have a light by your bed that is easy to reach. Do not use any sheets or blankets that are too big for your bed. They should not hang down onto the floor. Have a  firm chair that has side arms. You can use this for support while you get dressed. Do not have throw rugs and other things on the floor that can make you trip. What can I do in the kitchen? Clean up any spills right away. Avoid walking on wet floors. Keep items that you use a lot in easy-to-reach places. If you need to reach something above you, use a strong step stool that has a grab bar. Keep electrical cords out of the way. Do not use floor polish or wax that makes floors slippery. If you must use wax, use non-skid floor wax. Do not have throw rugs and other things on the floor that can make you trip. What can I do with my stairs? Do not leave any items on the stairs. Make sure that there are handrails on both sides of the stairs and use them. Fix handrails that are broken or loose. Make sure that handrails are as long as the stairways. Check any carpeting to make sure that it is firmly attached to the stairs. Fix any carpet that is loose or worn. Avoid having throw rugs at the top or bottom of the stairs. If you do have throw rugs, attach them to the floor with carpet tape. Make sure that you have a light switch at the top  of the stairs and the bottom of the stairs. If you do not have them, ask someone to add them for you. What else can I do to help prevent falls? Wear shoes that: Do not have high heels. Have rubber bottoms. Are comfortable and fit you well. Are closed at the toe. Do not wear sandals. If you use a stepladder: Make sure that it is fully opened. Do not climb a closed stepladder. Make sure that both sides of the stepladder are locked into place. Ask someone to hold it for you, if possible. Clearly mark and make sure that you can see: Any grab bars or handrails. First and last steps. Where the edge of each step is. Use tools that help you move around (mobility aids) if they are needed. These include: Canes. Walkers. Scooters. Crutches. Turn on the lights when you  go into a dark area. Replace any light bulbs as soon as they burn out. Set up your furniture so you have a clear path. Avoid moving your furniture around. If any of your floors are uneven, fix them. If there are any pets around you, be aware of where they are. Review your medicines with your doctor. Some medicines can make you feel dizzy. This can increase your chance of falling. Ask your doctor what other things that you can do to help prevent falls. This information is not intended to replace advice given to you by your health care provider. Make sure you discuss any questions you have with your health care provider. Document Released: 03/29/2009 Document Revised: 11/08/2015 Document Reviewed: 07/07/2014 Elsevier Interactive Patient Education  2017 [employer].

## 2022-11-20 NOTE — Progress Notes (Signed)
Subjective:   Tricia Woodward is a 67 y.o. female who presents for Medicare Annual (Subsequent) preventive examination.  I connected with  Willaim Sheng on 11/20/22 by a telephone enabled telemedicine application and verified that I am speaking with the correct person using two identifiers.   I discussed the limitations of evaluation and management by telemedicine. The patient expressed understanding and agreed to proceed.  Patient location: home  Provider location: telephone/home  .  Review of Systems     Cardiac Risk Factors include: advanced age (>14men, >79 women);diabetes mellitus     Objective:    There were no vitals filed for this visit. There is no height or weight on file to calculate BMI.     11/20/2022    4:03 PM 12/10/2021   11:21 AM 11/02/2020    2:04 PM 09/25/2020    4:15 PM 12/02/2019    2:33 PM 02/04/2019    4:42 AM 06/14/2015    8:32 AM  Advanced Directives  Does Patient Have a Medical Advance Directive? Yes Yes No No Yes No No  Type of Estate agent of [employer] Power of [place];Living will   Healthcare Power of [place];Living will    Copy of Healthcare Power of Attorney in Chart? No - copy requested No - copy requested   No - copy requested      Current Medications (verified) Outpatient Encounter Medications as of 11/20/2022  Medication Sig   aspirin 81 MG EC tablet Take 1 tablet (81 mg total) by mouth daily. Swallow whole.   calcitRIOL (ROCALTROL) 0.5 MCG capsule Take 2 capsules (1 mcg total) by mouth daily.   cholecalciferol (VITAMIN D3) 25 MCG (1000 UNIT) tablet Take 2 tablets (2,000 Units total) by mouth daily.   clotrimazole-betamethasone (LOTRISONE) cream APPLY 1 APPLICATION TOPICALLY 2 TIMES DAILY TO FOOT RASH   diclofenac Sodium (VOLTAREN) 1 % GEL APPLY 2 G TOPICALLY 4 TIMES DAILY   ezetimibe (ZETIA) 10 MG tablet Take 1 tablet by mouth once daily   fish oil-omega-3 fatty acids 1000 MG capsule Take 2 g by mouth  daily.    levothyroxine (SYNTHROID) 112 MCG tablet Take 1 tablet (112 mcg total) by mouth daily.   magic mouthwash (lidocaine, diphenhydrAMINE, alum & mag hydroxide) suspension Swish and swallow 5 mLs 4 (four) times daily as needed for mouth pain.   Multiple Vitamin (MULTIVITAMIN) tablet Take 1 tablet by mouth daily.   traMADol (ULTRAM) 50 MG tablet Take 1 tablet (50 mg total) by mouth 2 (two) times daily as needed.   TURMERIC PO Take by mouth.   vitamin B-12 (CYANOCOBALAMIN) 1000 MCG tablet Take 1,000 mcg by mouth daily.   No facility-administered encounter medications on file as of 11/20/2022.    Allergies (verified) Penicillins   History: Past Medical History:  Diagnosis Date   Allergy    Arthritis    COVID-19 virus infection    Frequent falls    Heart murmur    Hemorrhoids    past hx    History of hepatitis C    treated with meds x 3 months   Hyperlipidemia    Low blood sugar    Thyroid disease    parahypothyroid disease   Past Surgical History:  Procedure Laterality Date   ABDOMINAL HYSTERECTOMY     partial   BREAST CYST EXCISION Bilateral 1983   BUNIONECTOMY     CARPAL TUNNEL RELEASE Left 2017   COLONOSCOPY     DENTAL SURGERY  POLYPECTOMY     THYROID SURGERY Bilateral 1970   Family History  Problem Relation Age of Onset   Cirrhosis Mother    Hypertension Father    Hypertension Sister    Arthritis Other    Hyperlipidemia Other    Aneurysm Other    Colon cancer Neg Hx    Esophageal cancer Neg Hx    Rectal cancer Neg Hx    Stomach cancer Neg Hx    Breast cancer Neg Hx    Colon polyps Neg Hx    Social History   Socioeconomic History   Marital status: Single    Spouse name: Not on file   Number of children: Not on file   Years of education: Not on file   Highest education level: Not on file  Occupational History   Not on file  Tobacco Use   Smoking status: Heavy Smoker    Packs/day: .2    Types: Cigarettes    Start date: 12/16/1978   Smokeless  tobacco: Never   Tobacco comments:    e cigs at times  Vaping Use   Vaping Use: Former  Substance and Sexual Activity   Alcohol use: Yes    Alcohol/week: 5.0 standard drinks of alcohol    Types: 2 Glasses of wine, 3 Cans of beer per week    Comment: 1-2 x   drinks per times a week   Drug use: No   Sexual activity: Not Currently  Other Topics Concern   Not on file  Social History Narrative   Not on file   Social Determinants of Health   Financial Resource Strain: Low Risk  (12/10/2021)   Overall Financial Resource Strain (CARDIA)    Difficulty of Paying Living Expenses: Not hard at all  Food Insecurity: No Food Insecurity (12/10/2021)   Hunger Vital Sign    Worried About Running Out of Food in the Last Year: Never true    Ran Out of Food in the Last Year: Never true  Transportation Needs: No Transportation Needs (12/10/2021)   PRAPARE - Administrator, Civil Service (Medical): No    Lack of Transportation (Non-Medical): No  Physical Activity: Sufficiently Active (12/10/2021)   Exercise Vital Sign    Days of Exercise per Week: 3 days    Minutes of Exercise per Session: 50 min  Stress: No Stress Concern Present (12/10/2021)   [employer] of Occupational Health - Occupational Stress Questionnaire    Feeling of Stress : Only a little  Social Connections: Moderately Integrated (12/10/2021)   Social Connection and Isolation Panel [NHANES]    Frequency of Communication with Friends and Family: Twice a week    Frequency of Social Gatherings with Friends and Family: Twice a week    Attends Religious Services: More than 4 times per year    Active Member of [employer] or Organizations: Yes    Attends Engineer, structural: More than 4 times per year    Marital Status: Never married    Tobacco Counseling Ready to quit: Not Answered Counseling given: Not Answered Tobacco comments: e cigs at times   Clinical Intake:  Pre-visit preparation completed: Yes  Pain :  No/denies pain     Diabetes: No  How often do you need to have someone help you when you read instructions, pamphlets, or other written materials from your doctor or pharmacy?: 1 - Never  Diabetic?  no  Interpreter Needed?: No  Information entered by :: Remi Haggard LPN  Activities of Daily Living    11/20/2022    4:06 PM 03/31/2022   12:49 PM  In your present state of health, do you have any difficulty performing the following activities:  Hearing? 1 0  Vision? 0 0  Difficulty concentrating or making decisions? 0 0  Walking or climbing stairs? 0 0  Dressing or bathing? 0 0  Doing errands, shopping? 0 0  Preparing Food and eating ? N   Using the Toilet? N   In the past six months, have you accidently leaked urine? N   Do you have problems with loss of bowel control? N   Managing your Medications? N   Managing your Finances? N   Housekeeping or managing your Housekeeping? N     Patient Care Team: Sheliah Hatch, MD as PCP - General (Family Medicine) Erroll Luna, [facility] (Pharmacist)  Indicate any recent Medical Services you may have received from other than [facility] providers in the past year (date may be approximate).     Assessment:   This is a routine wellness examination for Channa.  Hearing/Vision screen Hearing Screening - Comments:: Bilateral hearing aids Vision Screening - Comments:: Up to date [place] [facility]  Dietary issues and exercise activities discussed: Current Exercise Habits: Structured exercise class (water fitness), Time (Minutes): 40, Frequency (Times/Week): 3, Weekly Exercise (Minutes/Week): 120, Intensity: Mild   Goals Addressed             This Visit's Progress    Patient Stated       [place]        Depression Screen    10/20/2022    1:21 PM 03/31/2022   12:48 PM 12/10/2021   11:22 AM 12/10/2021   11:20 AM 10/02/2021   11:05 AM 06/19/2021    2:34 PM 03/29/2021    1:30 PM  PHQ 2/9 Scores  PHQ - 2 Score 2 2  0 0 0 1 1  PHQ- 9 Score 9 10   0 7 7    Fall Risk    11/20/2022    4:01 PM 10/20/2022    1:21 PM 03/31/2022   12:48 PM 12/10/2021   11:23 AM 10/02/2021   11:05 AM  Fall Risk   Falls in the past year? 1 1 0 0 0  Number falls in past yr: 0 1  0 0  Injury with Fall? 1 1  0 0  Risk for fall due to :  History of fall(s) No Fall Risks  No Fall Risks  Follow up Education provided;Falls evaluation completed;Falls prevention discussed Falls evaluation completed Falls evaluation completed Falls evaluation completed;Education provided Falls evaluation completed    FALL RISK PREVENTION PERTAINING TO THE HOME:  Any stairs in or around the home? Yes  If so, are there any without handrails? No  Home free of loose throw rugs in walkways, pet beds, electrical cords, etc? Yes  Adequate lighting in your home to reduce risk of falls? Yes   ASSISTIVE DEVICES UTILIZED TO PREVENT FALLS:  Life alert? No  Use of a cane, walker or w/c? No  Grab bars in the bathroom? Yes  Shower chair or bench in shower? No  Elevated toilet seat or a handicapped toilet? Yes   TIMED UP AND GO:  Was the test performed? No .    Cognitive Function:        11/20/2022    4:05 PM  6CIT Screen  What Year? 0 points  What month? 0  points  What time? 0 points  Count back from 20 0 points  Months in reverse 0 points  Repeat phrase 0 points  Total Score 0 points    Immunizations Immunization History  Administered Date(s) Administered   Tdap 02/19/2017, 02/04/2019, 09/25/2020    TDAP status: Up to date  Flu Vaccine status: Declined, Education has been provided regarding the importance of this vaccine but patient still declined. Advised may receive this vaccine at local pharmacy or Health Dept. Aware to provide a copy of the vaccination record if obtained from local pharmacy or Health Dept. Verbalized acceptance and understanding.  Pneumococcal vaccine status: Declined,  Education has been provided regarding the  importance of this vaccine but patient still declined. Advised may receive this vaccine at local pharmacy or Health Dept. Aware to provide a copy of the vaccination record if obtained from local pharmacy or Health Dept. Verbalized acceptance and understanding.   Covid-19 vaccine status: Information provided on how to obtain vaccines.   Qualifies for Shingles Vaccine? No   Zostavax completed No   Shingrix Completed?: No.    Education has been provided regarding the importance of this vaccine. Patient has been advised to call insurance company to determine out of pocket expense if they have not yet received this vaccine. Advised may also receive vaccine at local pharmacy or Health Dept. Verbalized acceptance and understanding.  Screening Tests Health Maintenance  Topic Date Due   Pneumonia Vaccine 57+ Years old (1 of 2 - PCV) 04/01/2023 (Originally 02/12/1962)   INFLUENZA VACCINE  01/15/2023   MAMMOGRAM  05/20/2023   Medicare Annual Wellness (AWV)  11/20/2023   Colonoscopy  02/07/2026   DTaP/Tdap/Td (4 - Td or Tdap) 09/26/2030   DEXA SCAN  Completed   Hepatitis C Screening  Completed   HPV VACCINES  Aged Out   COVID-19 Vaccine  Discontinued   Zoster Vaccines- Shingrix  Discontinued    Health Maintenance  There are no preventive care reminders to display for this patient.   Colorectal cancer screening: Type of screening: Colonoscopy. Completed 2022. Repeat every 5 years  Mammogram status: Completed  . Repeat every year  Bone Density status: Completed 2016. Results reflect: Bone density results: NORMAL. Repeat every 5 years.  Lung Cancer Screening: (Low Dose CT Chest recommended if Age 39-80 years, 30 pack-year currently smoking OR have quit w/in 15years.) does not qualify.   Lung Cancer Screening Referral:   Additional Screening:  Hepatitis C Screening: does not qualify; Completed 2022  Vision Screening: Recommended annual ophthalmology exams for early detection of glaucoma and  other disorders of the eye. Is the patient up to date with their annual eye exam?  Yes  Who is the provider or what is the name of the office in which the patient attends annual eye exams? Unsure of name If pt is not established with a provider, would they like to be referred to a provider to establish care? No .   Dental Screening: Recommended annual dental exams for proper oral hygiene  Community Resource Referral / Chronic Care Management: CRR required this visit?  No   CCM required this visit?  No      Plan:     I have personally reviewed and noted the following in the patient's chart:   Medical and social history Use of alcohol, tobacco or illicit drugs  Current medications and supplements including opioid prescriptions. Patient is not currently taking opioid prescriptions. Functional ability and status Nutritional status Physical activity Advanced directives List  of other physicians Hospitalizations, surgeries, and ER visits in previous 12 months Vitals Screenings to include cognitive, depression, and falls Referrals and appointments  In addition, I have reviewed and discussed with patient certain preventive protocols, quality metrics, and best practice recommendations. A written personalized care plan for preventive services as well as general preventive health recommendations were provided to patient.     Remi Haggard, LPN   4/0/9811   Nurse Notes:

## 2022-12-02 ENCOUNTER — Other Ambulatory Visit: Payer: Self-pay | Admitting: Family Medicine

## 2022-12-02 DIAGNOSIS — R21 Rash and other nonspecific skin eruption: Secondary | ICD-10-CM

## 2022-12-08 ENCOUNTER — Other Ambulatory Visit: Payer: Self-pay | Admitting: Family Medicine

## 2023-03-06 ENCOUNTER — Other Ambulatory Visit: Payer: Self-pay | Admitting: Family Medicine

## 2023-03-18 DIAGNOSIS — R7303 Prediabetes: Secondary | ICD-10-CM | POA: Diagnosis not present

## 2023-03-18 DIAGNOSIS — E890 Postprocedural hypothyroidism: Secondary | ICD-10-CM | POA: Diagnosis not present

## 2023-03-18 DIAGNOSIS — E892 Postprocedural hypoparathyroidism: Secondary | ICD-10-CM | POA: Diagnosis not present

## 2023-03-26 ENCOUNTER — Encounter: Payer: Self-pay | Admitting: Family Medicine

## 2023-03-27 ENCOUNTER — Encounter: Payer: Self-pay | Admitting: Family Medicine

## 2023-03-27 ENCOUNTER — Ambulatory Visit: Payer: 59 | Admitting: Family Medicine

## 2023-03-27 VITALS — BP 110/70 | HR 70 | Temp 97.9°F | Ht 68.5 in | Wt 130.0 lb

## 2023-03-27 DIAGNOSIS — M25512 Pain in left shoulder: Secondary | ICD-10-CM | POA: Diagnosis not present

## 2023-03-27 MED ORDER — TRAMADOL HCL 50 MG PO TABS: 50.0000 mg | ORAL_TABLET | Freq: Two times a day (BID) | ORAL | 0 refills | Status: DC | PRN

## 2023-03-27 MED ORDER — PREDNISONE 10 MG PO TABS: ORAL_TABLET | ORAL | 0 refills | Status: DC

## 2023-03-27 NOTE — Progress Notes (Signed)
   Subjective:    Patient ID: Tricia Woodward, female    DOB: 1955-09-29, 67 y.o.   MRN: [id number]  HPI Shoulder pain- L sided.  sxs started in August and she thought she aggravated doing water aerobics.  Pain has been worsening.  Now keeping her up at night, radiating down L arm.  No relief w/ Tylenol or Ibuprofen.  Using heat w/ some relief.  Has good motion but motion is painful.     Review of Systems For ROS see HPI     Objective:   Physical Exam Vitals reviewed.  Constitutional:      General: She is not in acute distress.    Appearance: Normal appearance. She is not ill-appearing.  HENT:     Head: Normocephalic and atraumatic.  Cardiovascular:     Pulses: Normal pulses.  Musculoskeletal:        General: No swelling, deformity or signs of injury.     Comments: + Hawkings test L side Pain w/ overhead motion and internal rotation of L shoulder  Neurological:     General: No focal deficit present.     Mental Status: She is alert and oriented to person, place, and time.     Motor: No weakness.     Deep Tendon Reflexes: Reflexes normal.  Psychiatric:        Mood and Affect: Mood normal.        Behavior: Behavior normal.           Assessment & Plan:  L shoulder pain- new.  Sxs started in August w/o any known injury.  Initially thought she inflamed her shoulder doing water aerobics but pain has been worsening and now it's keeping her up at night.  No relief w/ OTC pain meds.  Will start Prednisone taper to treat inflammation, refill tramadol to use for severe pain, and refer to ortho.  Reviewed supportive care and red flags that should prompt return.  Pt expressed understanding and is in agreement w/ plan.

## 2023-03-27 NOTE — Patient Instructions (Signed)
Follow up as needed or as scheduled START the Prednisone as directed- 3 pills at the same time x3 days, then 2 pills at the same time x3 days, then 1 pill daily.  Take w/ food  AVOID ibuprofen, aleve, motrin, etc while on Prednisone We'll call you to schedule your Orthopedic appt Call with any questions or concerns Stay Safe!  Stay Healthy! Hang in there!

## 2023-03-31 NOTE — Telephone Encounter (Signed)
Called pt and made her aware of the [facility]. She states she will wait until Dr.Tabori send her to whom she will like her to see. She states she is not in pain at this time but is concerned about the pain she will have after medication goes away. I did tell her the referral could take up 1-2 wks .

## 2023-04-02 ENCOUNTER — Encounter: Payer: Self-pay | Admitting: Family Medicine

## 2023-04-02 NOTE — Telephone Encounter (Signed)
Pt called in reports [facility] doesn't have her referral and need it to be re sent to this fax number  [phone] so she can get scheduled and would like you to call her or send a message letting her know you have done this

## 2023-04-06 ENCOUNTER — Other Ambulatory Visit: Payer: Self-pay | Admitting: Family Medicine

## 2023-04-06 DIAGNOSIS — Z1231 Encounter for screening mammogram for malignant neoplasm of breast: Secondary | ICD-10-CM

## 2023-04-15 DIAGNOSIS — M25512 Pain in left shoulder: Secondary | ICD-10-CM | POA: Diagnosis not present

## 2023-04-15 DIAGNOSIS — R7303 Prediabetes: Secondary | ICD-10-CM | POA: Diagnosis not present

## 2023-04-15 DIAGNOSIS — E890 Postprocedural hypothyroidism: Secondary | ICD-10-CM | POA: Diagnosis not present

## 2023-04-15 DIAGNOSIS — E7800 Pure hypercholesterolemia, unspecified: Secondary | ICD-10-CM | POA: Diagnosis not present

## 2023-04-15 DIAGNOSIS — E892 Postprocedural hypoparathyroidism: Secondary | ICD-10-CM | POA: Diagnosis not present

## 2023-04-20 ENCOUNTER — Encounter: Payer: Self-pay | Admitting: Family Medicine

## 2023-04-20 ENCOUNTER — Ambulatory Visit: Payer: 59 | Admitting: Family Medicine

## 2023-04-20 VITALS — BP 120/60 | HR 80 | Temp 97.8°F | Ht 68.0 in | Wt 130.5 lb

## 2023-04-20 DIAGNOSIS — Z0000 Encounter for general adult medical examination without abnormal findings: Secondary | ICD-10-CM

## 2023-04-20 DIAGNOSIS — R7303 Prediabetes: Secondary | ICD-10-CM

## 2023-04-20 LAB — BASIC METABOLIC PANEL
BUN: 19 mg/dL (ref 6–23)
CO2: 35 meq/L — ABNORMAL HIGH (ref 19–32)
Calcium: 9.1 mg/dL (ref 8.4–10.5)
Chloride: 99 meq/L (ref 96–112)
Creatinine, Ser: 0.89 mg/dL (ref 0.40–1.20)
GFR: 66.73 mL/min (ref >60.00)
Glucose, Bld: 66 mg/dL — ABNORMAL LOW (ref 70–99)
Potassium: 3.7 meq/L (ref 3.5–5.1)
Sodium: 140 meq/L (ref 135–145)

## 2023-04-20 LAB — HEPATIC FUNCTION PANEL
ALT: 15 U/L (ref 0–35)
AST: 22 U/L (ref 0–37)
Albumin: 4.6 g/dL (ref 3.5–5.2)
Alkaline Phosphatase: 47 U/L (ref 39–117)
Bilirubin, Direct: 0.1 mg/dL (ref 0.0–0.3)
Total Bilirubin: 0.7 mg/dL (ref 0.2–1.2)
Total Protein: 8.6 g/dL — ABNORMAL HIGH (ref 6.0–8.3)

## 2023-04-20 LAB — CBC WITH DIFFERENTIAL/PLATELET
Basophils Absolute: 0.1 10*3/uL (ref 0.0–0.1)
Basophils Relative: 0.9 % (ref 0.0–3.0)
Eosinophils Absolute: 0.1 10*3/uL (ref 0.0–0.7)
Eosinophils Relative: 1.8 % (ref 0.0–5.0)
HCT: 40.6 % (ref 36.0–46.0)
Hemoglobin: 13.2 g/dL (ref 12.0–15.0)
Lymphocytes Relative: 47.6 % — ABNORMAL HIGH (ref 12.0–46.0)
Lymphs Abs: 2.9 10*3/uL (ref 0.7–4.0)
MCHC: 32.6 g/dL (ref 30.0–36.0)
MCV: 93 fL (ref 78.0–100.0)
Monocytes Absolute: 0.7 10*3/uL (ref 0.1–1.0)
Monocytes Relative: 11.9 % (ref 3.0–12.0)
Neutro Abs: 2.3 10*3/uL (ref 1.4–7.7)
Neutrophils Relative %: 37.8 % — ABNORMAL LOW (ref 43.0–77.0)
Platelets: 350 10*3/uL (ref 150.0–400.0)
RBC: 4.36 Mil/uL (ref 3.87–5.11)
RDW: 14.7 % (ref 11.5–15.5)
WBC: 6 10*3/uL (ref 4.0–10.5)

## 2023-04-20 LAB — LIPID PANEL
Cholesterol: 270 mg/dL — ABNORMAL HIGH (ref 0–200)
HDL: 111.8 mg/dL (ref >39.00)
LDL Cholesterol: 140 mg/dL — ABNORMAL HIGH (ref 0–99)
NonHDL: 158.04
Total CHOL/HDL Ratio: 2
Triglycerides: 92 mg/dL (ref 0.0–149.0)
VLDL: 18.4 mg/dL (ref 0.0–40.0)

## 2023-04-20 LAB — TSH: TSH: 4.9 u[IU]/mL (ref 0.35–5.50)

## 2023-04-20 LAB — HEMOGLOBIN A1C: Hgb A1c MFr Bld: 6.3 % (ref 4.6–6.5)

## 2023-04-20 NOTE — Patient Instructions (Signed)
Follow up in 6 months to recheck blood pressure and cholesterol We'll notify you of your lab results and make any changes if needed Keep up the good work on healthy diet and regular exercise- you look great! Call with any questions or concerns Stay Safe!  Stay Healthy! Happy Fall!!

## 2023-04-20 NOTE — Progress Notes (Signed)
   Subjective:    Patient ID: Tricia Woodward, female    DOB: 11/29/1955, 67 y.o.   MRN: [id number]  HPI CPE- UTD on mammo, colonoscopy, Tdap, DEXA.  Declines PNA and flu.  Patient Care Team    Relationship Specialty Notifications Start End  Sheliah Hatch, MD PCP - General Family Medicine  09/07/13   Erroll Luna, [place] (Inactive)  Pharmacist  07/31/21    Comment: [phone]  [facility]  Specialist  04/20/23   Dorisann Frames, MD Referring Physician Endocrinology  04/20/23     Health Maintenance  Topic Date Due   Pneumonia Vaccine 33+ Years old (1 of 2 - PCV) Never done   Zoster Vaccines- Shingrix (1 of 2) Never done   INFLUENZA VACCINE  Never done   COVID-19 Vaccine (1 - 2023-24 season) Never done   MAMMOGRAM  05/20/2023   Medicare Annual Wellness (AWV)  11/20/2023   Colonoscopy  02/07/2026   DTaP/Tdap/Td (4 - Td or Tdap) 09/26/2030   DEXA SCAN  Completed   Hepatitis C Screening  Completed   HPV VACCINES  Aged Out      Review of Systems Patient reports no vision/ hearing changes, adenopathy,fever, weight change,  persistant/recurrent hoarseness , swallowing issues, chest pain, palpitations, edema, persistant/recurrent cough, hemoptysis, dyspnea (rest/exertional/paroxysmal nocturnal), gastrointestinal bleeding (melena, rectal bleeding), abdominal pain, significant heartburn, bowel changes, GU symptoms (dysuria, hematuria, incontinence), Gyn symptoms (abnormal  bleeding, pain),  syncope, focal weakness, memory loss, numbness & tingling, skin/hair/nail changes, abnormal bruising or bleeding, anxiety, or depression.     Objective:   Physical Exam General Appearance:    Alert, cooperative, no distress, appears stated age  Head:    Normocephalic, without obvious abnormality, atraumatic  Eyes:    PERRL, conjunctiva/corneas clear, EOM's intact both eyes  Ears:    Normal TM's and external ear canals, both ears  Nose:   Nares normal, septum midline, mucosa normal, no  drainage    or sinus tenderness  Throat:   Lips, mucosa, and tongue normal; teeth and gums normal  Neck:   Supple, symmetrical, trachea midline, no adenopathy;    Thyroid: no enlargement/tenderness/nodules  Back:     Symmetric, no curvature, ROM normal, no CVA tenderness  Lungs:     Clear to auscultation bilaterally, respirations unlabored  Chest Wall:    No tenderness or deformity   Heart:    Regular rate and rhythm, S1 and S2 normal, no murmur, rub   or gallop  Breast Exam:    Deferred to GYN  Abdomen:     Soft, non-tender, bowel sounds active all four quadrants,    no masses, no organomegaly  Genitalia:    Deferred to GYN  Rectal:    Extremities:   Extremities normal, atraumatic, no cyanosis or edema  Pulses:   2+ and symmetric all extremities  Skin:   Skin color, texture, turgor normal, no rashes or lesions  Lymph nodes:   Cervical, supraclavicular, and axillary nodes normal  Neurologic:   CNII-XII intact, normal strength, sensation and reflexes    throughout          Assessment & Plan:

## 2023-04-20 NOTE — Assessment & Plan Note (Signed)
Pt's PE unchanged from previous.  UTD on mammo, colonoscopy, Tdap, DEXA.  Declines flu and PNA.  Check labs.  Anticipatory guidance provided.

## 2023-04-20 NOTE — Assessment & Plan Note (Signed)
Check labs to risk stratify 

## 2023-04-21 ENCOUNTER — Encounter: Payer: Self-pay | Admitting: Family Medicine

## 2023-04-21 NOTE — Telephone Encounter (Signed)
Patient asking if the Zetia can't be increased in stead notes if you feel strongly about statin she is willing to try but stated she will not take Crestor due to previous bad experience with it. Also notes she can improve her diet and return fasting for more accurate numbers.   Please advise

## 2023-05-25 ENCOUNTER — Ambulatory Visit
Admission: RE | Admit: 2023-05-25 | Discharge: 2023-05-25 | Disposition: A | Discharge status: Home / Self Care | Payer: 59 | Source: Ambulatory Visit | Attending: Family Medicine | Admitting: Family Medicine

## 2023-05-25 DIAGNOSIS — Z1231 Encounter for screening mammogram for malignant neoplasm of breast: Secondary | ICD-10-CM

## 2023-05-27 DIAGNOSIS — E890 Postprocedural hypothyroidism: Secondary | ICD-10-CM | POA: Diagnosis not present

## 2023-05-31 ENCOUNTER — Other Ambulatory Visit: Payer: Self-pay | Admitting: Internal Medicine

## 2023-05-31 DIAGNOSIS — E209 Hypoparathyroidism, unspecified: Secondary | ICD-10-CM

## 2023-06-04 ENCOUNTER — Encounter: Payer: Self-pay | Admitting: Family Medicine

## 2023-06-17 ENCOUNTER — Other Ambulatory Visit: Payer: Self-pay | Admitting: Family Medicine

## 2023-06-17 ENCOUNTER — Other Ambulatory Visit: Payer: Self-pay | Admitting: Internal Medicine

## 2023-06-17 DIAGNOSIS — E209 Hypoparathyroidism, unspecified: Secondary | ICD-10-CM

## 2023-06-19 ENCOUNTER — Other Ambulatory Visit: Payer: Self-pay | Admitting: Internal Medicine

## 2023-06-19 ENCOUNTER — Telehealth: Payer: Self-pay

## 2023-06-19 DIAGNOSIS — E209 Hypoparathyroidism, unspecified: Secondary | ICD-10-CM

## 2023-06-19 NOTE — Telephone Encounter (Signed)
 Pt has been advised to reach out to [facility] office to ensure this wasn't denied by them due to a change in patients therapy.  Patient was displeased but said she would call their office.

## 2023-06-19 NOTE — Telephone Encounter (Signed)
 Copied from CRM [id number]. Topic: Clinical - Medication Refill >> Jun 19, 2023 12:58 PM Maisie BROCKS wrote: Most Recent Primary Care Visit:  Provider: TABORI, KATHERINE E  Department: [facility]  Visit Type: PHYSICAL  Date: 04/20/2023  Medication: calcitRIOL  (ROCALTROL ) 0.5 MCG capsule  Has the patient contacted their pharmacy? Yes She stated she is unsure why it was denied through her pharmacy.    Is this the correct pharmacy for this prescription? Yes  This is the patient's preferred pharmacy:  [facility] [address] [place] - [address] [place] [address] Phone: [phone] Fax: [phone]   Has the prescription been filled recently? No  Is the patient out of the medication? No  Has the patient been seen for an appointment in the last year OR does the patient have an upcoming appointment? Yes  Can we respond through MyChart? No  Agent: Please be advised that Rx refills may take up to 3 business days. We ask that you follow-up with your pharmacy.

## 2023-08-16 ENCOUNTER — Telehealth: Admitting: Physician Assistant

## 2023-08-16 DIAGNOSIS — K047 Periapical abscess without sinus: Secondary | ICD-10-CM

## 2023-08-16 DIAGNOSIS — K069 Disorder of gingiva and edentulous alveolar ridge, unspecified: Secondary | ICD-10-CM

## 2023-08-16 MED ORDER — LIDOCAINE VISCOUS HCL 2 % MT SOLN: 5.0000 mL | Freq: Four times a day (QID) | OROMUCOSAL | 0 refills | Status: AC | PRN

## 2023-08-16 MED ORDER — CLINDAMYCIN HCL 300 MG PO CAPS
300.0000 mg | ORAL_CAPSULE | Freq: Three times a day (TID) | ORAL | 0 refills | Status: DC
Start: 2023-08-16 — End: 2023-08-16

## 2023-08-16 MED ORDER — LIDOCAINE VISCOUS HCL 2 % MT SOLN: 5.0000 mL | Freq: Four times a day (QID) | OROMUCOSAL | 0 refills | Status: DC | PRN

## 2023-08-16 MED ORDER — CLINDAMYCIN HCL 300 MG PO CAPS: 300.0000 mg | ORAL_CAPSULE | Freq: Three times a day (TID) | ORAL | 0 refills | Status: DC

## 2023-08-16 MED ORDER — CLINDAMYCIN HCL 300 MG PO CAPS: 300.0000 mg | ORAL_CAPSULE | Freq: Three times a day (TID) | ORAL | 0 refills | Status: AC

## 2023-08-16 NOTE — Addendum Note (Signed)
 Addended by: Roney Jaffe on: 08/16/2023 12:00 PM   Modules accepted: Orders

## 2023-08-16 NOTE — Addendum Note (Signed)
 Addended by: Roney Jaffe on: 08/16/2023 12:37 PM   Modules accepted: Orders

## 2023-08-16 NOTE — Progress Notes (Signed)

## 2023-08-16 NOTE — Addendum Note (Signed)
 Addended by: Roney Jaffe on: 08/16/2023 12:30 PM   Modules accepted: Orders

## 2023-09-02 DIAGNOSIS — M25512 Pain in left shoulder: Secondary | ICD-10-CM | POA: Diagnosis not present

## 2023-09-11 ENCOUNTER — Other Ambulatory Visit: Payer: Self-pay | Admitting: Family Medicine

## 2023-09-11 DIAGNOSIS — M25512 Pain in left shoulder: Secondary | ICD-10-CM | POA: Diagnosis not present

## 2023-09-15 DIAGNOSIS — M25512 Pain in left shoulder: Secondary | ICD-10-CM | POA: Diagnosis not present

## 2023-09-22 DIAGNOSIS — R6889 Other general symptoms and signs: Secondary | ICD-10-CM | POA: Diagnosis not present

## 2023-10-12 DIAGNOSIS — E890 Postprocedural hypothyroidism: Secondary | ICD-10-CM | POA: Diagnosis not present

## 2023-10-12 DIAGNOSIS — R7303 Prediabetes: Secondary | ICD-10-CM | POA: Diagnosis not present

## 2023-10-12 DIAGNOSIS — E892 Postprocedural hypoparathyroidism: Secondary | ICD-10-CM | POA: Diagnosis not present

## 2023-10-15 DIAGNOSIS — H905 Unspecified sensorineural hearing loss: Secondary | ICD-10-CM | POA: Diagnosis not present

## 2023-10-19 ENCOUNTER — Ambulatory Visit (INDEPENDENT_AMBULATORY_CARE_PROVIDER_SITE_OTHER): Payer: 59 | Admitting: Family Medicine

## 2023-10-19 ENCOUNTER — Encounter: Payer: Self-pay | Admitting: Family Medicine

## 2023-10-19 VITALS — BP 104/52 | HR 91 | Temp 97.9°F | Ht 68.0 in | Wt 133.0 lb

## 2023-10-19 DIAGNOSIS — R5383 Other fatigue: Secondary | ICD-10-CM | POA: Diagnosis not present

## 2023-10-19 DIAGNOSIS — I959 Hypotension, unspecified: Secondary | ICD-10-CM | POA: Diagnosis not present

## 2023-10-19 DIAGNOSIS — Z780 Asymptomatic menopausal state: Secondary | ICD-10-CM | POA: Diagnosis not present

## 2023-10-19 DIAGNOSIS — Z789 Other specified health status: Secondary | ICD-10-CM | POA: Diagnosis not present

## 2023-10-19 DIAGNOSIS — E890 Postprocedural hypothyroidism: Secondary | ICD-10-CM | POA: Diagnosis not present

## 2023-10-19 DIAGNOSIS — E7800 Pure hypercholesterolemia, unspecified: Secondary | ICD-10-CM | POA: Diagnosis not present

## 2023-10-19 DIAGNOSIS — E785 Hyperlipidemia, unspecified: Secondary | ICD-10-CM | POA: Diagnosis not present

## 2023-10-19 DIAGNOSIS — E892 Postprocedural hypoparathyroidism: Secondary | ICD-10-CM | POA: Diagnosis not present

## 2023-10-19 DIAGNOSIS — R7303 Prediabetes: Secondary | ICD-10-CM | POA: Diagnosis not present

## 2023-10-19 LAB — CBC WITH DIFFERENTIAL/PLATELET
Basophils Absolute: 0 10*3/uL (ref 0.0–0.1)
Basophils Relative: 0.9 % (ref 0.0–3.0)
Eosinophils Absolute: 0.1 10*3/uL (ref 0.0–0.7)
Eosinophils Relative: 1.5 % (ref 0.0–5.0)
HCT: 39.4 % (ref 36.0–46.0)
Hemoglobin: 13 g/dL (ref 12.0–15.0)
Lymphocytes Relative: 48.5 % — ABNORMAL HIGH (ref 12.0–46.0)
Lymphs Abs: 2.5 10*3/uL (ref 0.7–4.0)
MCHC: 33.1 g/dL (ref 30.0–36.0)
MCV: 92.6 fl (ref 78.0–100.0)
Monocytes Absolute: 0.6 10*3/uL (ref 0.1–1.0)
Monocytes Relative: 12.3 % — ABNORMAL HIGH (ref 3.0–12.0)
Neutro Abs: 1.9 10*3/uL (ref 1.4–7.7)
Neutrophils Relative %: 36.8 % — ABNORMAL LOW (ref 43.0–77.0)
Platelets: 335 10*3/uL (ref 150.0–400.0)
RBC: 4.26 Mil/uL (ref 3.87–5.11)
RDW: 14.9 % (ref 11.5–15.5)
WBC: 5.2 10*3/uL (ref 4.0–10.5)

## 2023-10-19 LAB — HEPATIC FUNCTION PANEL
ALT: 16 U/L (ref 0–35)
AST: 25 U/L (ref 0–37)
Albumin: 4.4 g/dL (ref 3.5–5.2)
Alkaline Phosphatase: 47 U/L (ref 39–117)
Bilirubin, Direct: 0.1 mg/dL (ref 0.0–0.3)
Total Bilirubin: 0.7 mg/dL (ref 0.2–1.2)
Total Protein: 7.9 g/dL (ref 6.0–8.3)

## 2023-10-19 LAB — B12 AND FOLATE PANEL
Folate: >25.2 ng/mL (ref >5.9)
Vitamin B-12: >1537 pg/mL — ABNORMAL HIGH (ref 211–911)

## 2023-10-19 LAB — LIPID PANEL
Cholesterol: 232 mg/dL — ABNORMAL HIGH (ref 0–200)
HDL: 91.5 mg/dL (ref >39.00)
LDL Cholesterol: 116 mg/dL — ABNORMAL HIGH (ref 0–99)
NonHDL: 140.63
Total CHOL/HDL Ratio: 3
Triglycerides: 122 mg/dL (ref 0.0–149.0)
VLDL: 24.4 mg/dL (ref 0.0–40.0)

## 2023-10-19 LAB — BASIC METABOLIC PANEL WITH GFR
BUN: 16 mg/dL (ref 6–23)
CO2: 28 meq/L (ref 19–32)
Calcium: 9.3 mg/dL (ref 8.4–10.5)
Chloride: 102 meq/L (ref 96–112)
Creatinine, Ser: 0.88 mg/dL (ref 0.40–1.20)
GFR: 67.4 mL/min (ref >60.00)
Glucose, Bld: 78 mg/dL (ref 70–99)
Potassium: 4.3 meq/L (ref 3.5–5.1)
Sodium: 140 meq/L (ref 135–145)

## 2023-10-19 LAB — VITAMIN D 25 HYDROXY (VIT D DEFICIENCY, FRACTURES): VITD: 55.51 ng/mL (ref 30.00–100.00)

## 2023-10-19 NOTE — Patient Instructions (Signed)
 Schedule your complete physical in 6 months We'll notify you of your lab results and make any changes if needed Make sure you are drinking LOTS of water Increase your salt intake to increase your blood pressure Change positions SLOWLY!  Allow yourself time to adjust Call with any questions or concerns Stay Safe!  Stay Healthy! Have a great summer!!!

## 2023-10-19 NOTE — Progress Notes (Unsigned)
   Subjective:    Patient ID: Tricia Woodward, female    DOB: [birth year], 68 y.o.   MRN: [id number]  HPI Hyperlipidemia- chronic problem.  Pt is currently on Zetia  due to statin intolerance.  Hypotension- BP today 104/52.  She reports feeling light headed when she gets up.  She had a virus recently which decreased her fluid intake.  Fatigue- pt reports since retiring she could 'just stay in bed all the time'.  She doesn't stay in bed, but frequently feels tired.  'dragging ass'.     Review of Systems For ROS see HPI     Objective:   Physical Exam Vitals reviewed.  Constitutional:      General: She is not in acute distress.    Appearance: Normal appearance. She is well-developed. She is not ill-appearing.  HENT:     Head: Normocephalic and atraumatic.  Eyes:     Conjunctiva/sclera: Conjunctivae normal.     Pupils: Pupils are equal, round, and reactive to light.  Neck:     Thyroid : No thyromegaly.  Cardiovascular:     Rate and Rhythm: Normal rate and regular rhythm.     Pulses: Normal pulses.     Heart sounds: Normal heart sounds. No murmur heard. Pulmonary:     Effort: Pulmonary effort is normal. No respiratory distress.     Breath sounds: Normal breath sounds.  Abdominal:     General: There is no distension.     Palpations: Abdomen is soft.     Tenderness: There is no abdominal tenderness.  Musculoskeletal:     Cervical back: Normal range of motion and neck supple.     Right lower leg: No edema.     Left lower leg: No edema.  Lymphadenopathy:     Cervical: No cervical adenopathy.  Skin:    General: Skin is warm and dry.  Neurological:     General: No focal deficit present.     Mental Status: She is alert and oriented to person, place, and time.  Psychiatric:        Mood and Affect: Mood normal.        Behavior: Behavior normal.           Assessment & Plan:  Fatigue- new.  Pt reports that since retiring she could spend all day in bed.  She forces herself to  get up and do things but she doesn't know why she feels so fatigued.  Will check labs to r/o vitamin deficiencies or other underlying cause.  Will follow.

## 2023-10-20 ENCOUNTER — Telehealth: Payer: Self-pay

## 2023-10-20 ENCOUNTER — Encounter: Payer: Self-pay | Admitting: Family Medicine

## 2023-10-20 NOTE — Assessment & Plan Note (Signed)
 Chronic problem.  On Zetia  10mg  daily due to statin intolerance.  Check labs.  Adjust meds prn

## 2023-10-20 NOTE — Assessment & Plan Note (Signed)
 Ongoing issue for pt.  BP 104/52 today.  She feels lightheaded when she changes positions.  She recently had a virus which decreased her fluid intake and worsened her dizziness.  Stressed need for increased fluids and increased salt.  Pt expressed understanding and is in agreement w/ plan.

## 2023-10-20 NOTE — Telephone Encounter (Signed)
-----   Message from Laymon Priest sent at 10/20/2023  7:29 AM EDT ----- Labs are stable and look good!  No changes at this time

## 2023-10-20 NOTE — Telephone Encounter (Signed)
 Pt has been notified.

## 2023-10-20 NOTE — Assessment & Plan Note (Signed)
 Ongoing issue.  On Zetia 

## 2023-10-22 DIAGNOSIS — M25512 Pain in left shoulder: Secondary | ICD-10-CM | POA: Diagnosis not present

## 2023-12-26 ENCOUNTER — Other Ambulatory Visit: Payer: Self-pay | Admitting: Family Medicine

## 2023-12-27 ENCOUNTER — Other Ambulatory Visit: Payer: Self-pay | Admitting: Family Medicine

## 2024-01-06 DIAGNOSIS — H2513 Age-related nuclear cataract, bilateral: Secondary | ICD-10-CM | POA: Diagnosis not present

## 2024-01-06 DIAGNOSIS — H5203 Hypermetropia, bilateral: Secondary | ICD-10-CM | POA: Diagnosis not present

## 2024-01-29 DIAGNOSIS — M25512 Pain in left shoulder: Secondary | ICD-10-CM | POA: Diagnosis not present

## 2024-02-17 DIAGNOSIS — M25512 Pain in left shoulder: Secondary | ICD-10-CM | POA: Diagnosis not present

## 2024-02-29 DIAGNOSIS — M25512 Pain in left shoulder: Secondary | ICD-10-CM | POA: Diagnosis not present

## 2024-03-26 ENCOUNTER — Other Ambulatory Visit: Payer: Self-pay | Admitting: Family Medicine

## 2024-03-31 ENCOUNTER — Other Ambulatory Visit: Payer: Self-pay | Admitting: Family Medicine

## 2024-04-13 ENCOUNTER — Ambulatory Visit (INDEPENDENT_AMBULATORY_CARE_PROVIDER_SITE_OTHER)

## 2024-04-13 VITALS — Ht 68.0 in | Wt 133.0 lb

## 2024-04-13 DIAGNOSIS — Z0000 Encounter for general adult medical examination without abnormal findings: Secondary | ICD-10-CM | POA: Diagnosis not present

## 2024-04-13 DIAGNOSIS — Z1231 Encounter for screening mammogram for malignant neoplasm of breast: Secondary | ICD-10-CM

## 2024-04-13 NOTE — Progress Notes (Signed)
 Subjective:   Tricia Woodward is a 68 y.o. who presents for a Medicare Wellness preventive visit.  As a reminder, Annual Wellness Visits don't include a physical exam, and some assessments may be limited, especially if this visit is performed virtually. We may recommend an in-person follow-up visit with your provider if needed.  Visit Complete: Virtual I connected with  Tricia Woodward on 04/13/24 by a audio enabled telemedicine application and verified that I am speaking with the correct person using two identifiers.  Patient Location: Home  Provider Location: Home Office  I discussed the limitations of evaluation and management by telemedicine. The patient expressed understanding and agreed to proceed.  Vital Signs: Because this visit was a virtual/telehealth visit, some criteria may be missing or patient reported. Any vitals not documented were not able to be obtained and vitals that have been documented are patient reported.  VideoDeclined- This patient declined Librarian, academic. Therefore the visit was completed with audio only.  Persons Participating in Visit: Patient.  AWV Questionnaire: No: Patient Medicare AWV questionnaire was not completed prior to this visit.  Cardiac Risk Factors include: advanced age (>41men, >56 women);Other (see comment);dyslipidemia, Risk factor comments: PAD     Objective:    Today's Vitals   04/13/24 1120  Weight: 133 lb (60.3 kg)  Height: 5' 8 (1.727 m)   Body mass index is 20.22 kg/m.     04/13/2024   11:27 AM 11/20/2022    4:03 PM 12/10/2021   11:21 AM 11/02/2020    2:04 PM 09/25/2020    4:15 PM 12/02/2019    2:33 PM 02/04/2019    4:42 AM  Advanced Directives  Does Patient Have a Medical Advance Directive? Yes Yes Yes No No Yes No  Type of Estate Agent of [place];Living will Healthcare Power of [place] of [place];Living will   Healthcare Power of [place];Living  will   Copy of Healthcare Power of Attorney in Chart? No - copy requested No - copy requested No - copy requested   No - copy requested     Current Medications (verified) Outpatient Encounter Medications as of 04/13/2024  Medication Sig   aspirin  81 MG EC tablet Take 1 tablet (81 mg total) by mouth daily. Swallow whole.   calcitRIOL  (ROCALTROL ) 0.5 MCG capsule Take 2 capsules (1 mcg total) by mouth daily.   cholecalciferol  (VITAMIN D3) 25 MCG (1000 UNIT) tablet Take 2 tablets (2,000 Units total) by mouth daily.   clotrimazole -betamethasone  (LOTRISONE ) cream APPLY 1 APPLICATION TOPICALLY 2 TIMES A DAY TO FOOT RASH   diclofenac  Sodium (VOLTAREN ) 1 % GEL APPLY 2 G TOPICALLY 4 TIMES DAILY   ezetimibe  (ZETIA ) 10 MG tablet Take 1 tablet by mouth once daily   fish oil-omega-3 fatty acids 1000 MG capsule Take 2 g by mouth daily.    levothyroxine  (SYNTHROID ) 125 MCG tablet TAKE 1 TABLET BY MOUTH IN THE MORNING ON AN EMPTY STOMACH ONCE DAILY   Multiple Vitamin (MULTIVITAMIN) tablet Take 1 tablet by mouth daily.   traMADol  (ULTRAM ) 50 MG tablet Take 1 tablet by mouth twice daily as needed   TURMERIC PO Take by mouth.   vitamin B-12 (CYANOCOBALAMIN ) 1000 MCG tablet Take 1,000 mcg by mouth daily.   levothyroxine  (SYNTHROID ) 112 MCG tablet Take 1 tablet (112 mcg total) by mouth daily. (Patient not taking: Reported on 04/13/2024)   No facility-administered encounter medications on file as of 04/13/2024.    Allergies (verified) Codeine and Penicillins  History: Past Medical History:  Diagnosis Date   Allergy    Arthritis    COVID-19 virus infection    Frequent falls    Heart murmur    Hemorrhoids    past hx    History of hepatitis C    treated with meds x 3 months   Hyperlipidemia    Low blood sugar    Thyroid  disease    parahypothyroid disease   Past Surgical History:  Procedure Laterality Date   ABDOMINAL HYSTERECTOMY     partial   BREAST CYST EXCISION Bilateral 1983    BUNIONECTOMY     CARPAL TUNNEL RELEASE Left 2017   COLONOSCOPY     DENTAL SURGERY     POLYPECTOMY     THYROID  SURGERY Bilateral 1970   Family History  Problem Relation Age of Onset   Cirrhosis Mother    Hypertension Father    Hypertension Sister    Arthritis Other    Hyperlipidemia Other    Aneurysm Other    Colon cancer Neg Hx    Esophageal cancer Neg Hx    Rectal cancer Neg Hx    Stomach cancer Neg Hx    Breast cancer Neg Hx    Colon polyps Neg Hx    Social History   Socioeconomic History   Marital status: Single    Spouse name: Not on file   Number of children: 2   Years of education: Not on file   Highest education level: Associate degree: occupational, scientist, product/process development, or vocational program  Occupational History   Occupation: RETIRED  Tobacco Use   Smoking status: Heavy Smoker    Current packs/day: 0.20    Average packs/day: 0.2 packs/day for 45.3 years (9.1 ttl pk-yrs)    Types: Cigarettes    Start date: 12/16/1978   Smokeless tobacco: Never   Tobacco comments:    e cigs at times  Vaping Use   Vaping status: Former  Substance and Sexual Activity   Alcohol use: Yes    Alcohol/week: 5.0 standard drinks of alcohol    Types: 2 Glasses of wine, 3 Cans of beer per week    Comment: 1-2 x   drinks per times a week   Drug use: No   Sexual activity: Not Currently  Other Topics Concern   Not on file  Social History Narrative   Lives alone/2025   Social Drivers of Health   Financial Resource Strain: Low Risk  (04/13/2024)   Overall Financial Resource Strain (CARDIA)    Difficulty of Paying Living Expenses: Not very hard  Food Insecurity: No Food Insecurity (04/13/2024)   Hunger Vital Sign    Worried About Running Out of Food in the Last Year: Never true    Ran Out of Food in the Last Year: Never true  Transportation Needs: No Transportation Needs (04/13/2024)   PRAPARE - Administrator, Civil Service (Medical): No    Lack of Transportation  (Non-Medical): No  Physical Activity: Sufficiently Active (04/13/2024)   Exercise Vital Sign    Days of Exercise per Week: 3 days    Minutes of Exercise per Session: 60 min  Stress: No Stress Concern Present (04/13/2024)   [employer] of Occupational Health - Occupational Stress Questionnaire    Feeling of Stress: Not at all  Social Connections: Moderately Integrated (04/13/2024)   Social Connection and Isolation Panel    Frequency of Communication with Friends and Family: More than three times a week    Frequency  of Social Gatherings with Friends and Family: Once a week    Attends Religious Services: More than 4 times per year    Active Member of [employer] or Organizations: Yes    Attends Engineer, Structural: More than 4 times per year    Marital Status: Divorced    Tobacco Counseling Ready to quit: Not Answered Counseling given: Not Answered Tobacco comments: e cigs at times    Clinical Intake:  Pre-visit preparation completed: Yes  Pain : No/denies pain     BMI - recorded: 20.22 Nutritional Status: BMI of 19-24  Normal Nutritional Risks: None Diabetes: No  Lab Results  Component Value Date   HGBA1C 6.3 04/20/2023   HGBA1C 6.3 10/20/2022   HGBA1C 6.2 01/21/2021     How often do you need to have someone help you when you read instructions, pamphlets, or other written materials from your doctor or pharmacy?: 1 - Never  Interpreter Needed?: No  Information entered by :: Mazen Marcin, RMA   Activities of Daily Living     04/13/2024   11:22 AM  In your present state of health, do you have any difficulty performing the following activities:  Hearing? 1  Comment wear hearing aides  Vision? 0  Difficulty concentrating or making decisions? 0  Walking or climbing stairs? 0  Dressing or bathing? 0  Doing errands, shopping? 0  Preparing Food and eating ? N  Using the Toilet? N  In the past six months, have you accidently leaked urine? N  Do you  have problems with loss of bowel control? N  Managing your Medications? N  Managing your Finances? N  Housekeeping or managing your Housekeeping? N    Patient Care Team: Mahlon Comer BRAVO, MD as PCP - General (Family Medicine) Nicholaus Sherlean CROME, [facility] (Inactive) (Pharmacist) Ortho, [facility] (Specialist) Tommas Pears, MD as Referring Physician (Endocrinology)  I have updated your Care Teams any recent Medical Services you may have received from other providers in the past year.     Assessment:   This is a routine wellness examination for Tricia Woodward.  Hearing/Vision screen Hearing Screening - Comments:: wear hearing aides Vision Screening - Comments:: Wears eyeglasses/[facility]/Up to date-per pt   Goals Addressed   None    Depression Screen     04/13/2024   11:32 AM 10/19/2023    1:52 PM 11/20/2022    4:16 PM 10/20/2022    1:21 PM 03/31/2022   12:48 PM 12/10/2021   11:22 AM 12/10/2021   11:20 AM  PHQ 2/9 Scores  PHQ - 2 Score 0 2 2 2 2  0 0  PHQ- 9 Score 6 10 3 9 10       Fall Risk     04/13/2024   11:28 AM 10/19/2023    1:04 PM 04/20/2023   12:21 PM 11/20/2022    4:01 PM 10/20/2022    1:21 PM  Fall Risk   Falls in the past year? 0 1 0 1 1  Number falls in past yr: 0 0 0 0 1  Injury with Fall? 0 1 0 1 1  Risk for fall due to :   No Fall Risks  History of fall(s)  Follow up Falls evaluation completed;Falls prevention discussed   Education provided;Falls evaluation completed;Falls prevention discussed Falls evaluation completed    MEDICARE RISK AT HOME:  Medicare Risk at Home Any stairs in or around the home?: Yes (inside home) If so, are there any without handrails?: No Home free  of loose throw rugs in walkways, pet beds, electrical cords, etc?: Yes Adequate lighting in your home to reduce risk of falls?: Yes Life alert?: No Use of a cane, walker or w/c?: No Grab bars in the bathroom?: Yes Shower chair or bench in shower?: Yes Elevated toilet seat or a  handicapped toilet?: Yes  TIMED UP AND GO:  Was the test performed?  No  Cognitive Function: Declined/Normal: No cognitive concerns noted by patient or family. Patient alert, oriented, able to answer questions appropriately and recall recent events. No signs of memory loss or confusion.        11/20/2022    4:05 PM  6CIT Screen  What Year? 0 points  What month? 0 points  What time? 0 points  Count back from 20 0 points  Months in reverse 0 points  Repeat phrase 0 points  Total Score 0 points    Immunizations Immunization History  Administered Date(s) Administered   Tdap 02/19/2017, 02/04/2019, 09/25/2020    Screening Tests Health Maintenance  Topic Date Due   Pneumococcal Vaccine: 50+ Years (1 of 2 - PCV) Never done   Zoster Vaccines- Shingrix (1 of 2) Never done   Influenza Vaccine  Never done   COVID-19 Vaccine (1 - 2025-26 season) Never done   Mammogram  05/24/2024   Medicare Annual Wellness (AWV)  04/13/2025   Colonoscopy  02/07/2026   DTaP/Tdap/Td (4 - Td or Tdap) 09/26/2030   DEXA SCAN  Completed   Hepatitis C Screening  Completed   Meningococcal B Vaccine  Aged Out    Health Maintenance Items Addressed: Mammogram ordered, See Nurse Notes at the end of this note  Additional Screening:  Vision Screening: Recommended annual ophthalmology exams for early detection of glaucoma and other disorders of the eye. Is the patient up to date with their annual eye exam?  Yes  Who is the provider or what is the name of the office in which the patient attends annual eye exams? [facility]/per pt-up to date.  Dental Screening: Recommended annual dental exams for proper oral hygiene  Community Resource Referral / Chronic Care Management: CRR required this visit?  No   CCM required this visit?  No   Plan:    I have personally reviewed and noted the following in the patient's chart:   Medical and social history Use of alcohol, tobacco or illicit drugs   Current medications and supplements including opioid prescriptions. Patient is not currently taking opioid prescriptions. Functional ability and status Nutritional status Physical activity Advanced directives List of other physicians Hospitalizations, surgeries, and ER visits in previous 12 months Vitals Screenings to include cognitive, depression, and falls Referrals and appointments  In addition, I have reviewed and discussed with patient certain preventive protocols, quality metrics, and best practice recommendations. A written personalized care plan for preventive services as well as general preventive health recommendations were provided to patient.   Sheyanne Munley L Benny Deutschman, CMA   04/13/2024   After Visit Summary: (MyChart) Due to this being a telephonic visit, the after visit summary with patients personalized plan was offered to patient via MyChart   Notes: Patient declines any due vaccines.  She is due for a mammogram and order has been placed.  Patient would like to discuss a DEXA with provider, as she is due according to chart, however, patient thinks that she has had it after 2016.  Patient would also like to discuss joint pain and that she has been coughing up white phlegm  lately.  She has a visit coming up on 05/04/2024, with provider.

## 2024-04-13 NOTE — Patient Instructions (Addendum)
 Tricia Woodward,  Thank you for taking the time for your Medicare Wellness Visit. I appreciate your continued commitment to your health goals. Please review the care plan we discussed, and feel free to reach out if I can assist you further.  Medicare recommends these wellness visits once per year to help you and your care team stay ahead of potential health issues. These visits are designed to focus on prevention, allowing your provider to concentrate on managing your acute and chronic conditions during your regular appointments.  Please note that Annual Wellness Visits do not include a physical exam. Some assessments may be limited, especially if the visit was conducted virtually. If needed, we may recommend a separate in-person follow-up with your provider.  Ongoing Care Seeing your primary care provider every 3 to 6 months helps us  monitor your health and provide consistent, personalized care. Next office visit on 05/04/2024.  Remember to discuss a bone density screening with Dr. Mahlon brenner according to your chart you have not had one since 2016.  Also remember to stop at front desk and get scheduled to have your Medicare annual wellness visit and your yearly physical, on the same day.    Referrals If a referral was made during today's visit and you haven't received any updates within two weeks, please contact the referred provider directly to check on the status. You have an order for:  [x]   3D Mammogram     Please call for appointment:  [facility] [address] [place], [place] [address] [phone]   Make sure to wear two-piece clothing.  No lotions, powders, or deodorants the day of the appointment. Make sure to bring picture ID and insurance card.  Bring list of medications you are currently taking including any supplements.    Recommended Screenings:  Health Maintenance  Topic Date Due   Pneumococcal Vaccine for age over 29 (1 of 2 - PCV) Never done    Zoster (Shingles) Vaccine (1 of 2) Never done   Medicare Annual Wellness Visit  11/20/2023   Flu Shot  Never done   COVID-19 Vaccine (1 - 2025-26 season) Never done   Breast Cancer Screening  05/24/2024   Colon Cancer Screening  02/07/2026   DTaP/Tdap/Td vaccine (4 - Td or Tdap) 09/26/2030   DEXA scan (bone density measurement)  Completed   Hepatitis C Screening  Completed   Meningitis B Vaccine  Aged Out       04/13/2024   11:27 AM  Advanced Directives  Does Patient Have a Medical Advance Directive? Yes  Type of Estate Agent of [place];Living will  Copy of Healthcare Power of Attorney in Chart? No - copy requested   Advance Care Planning is important because it: Ensures you receive medical care that aligns with your values, goals, and preferences. Provides guidance to your family and loved ones, reducing the emotional burden of decision-making during critical moments.  Vision: Annual vision screenings are recommended for early detection of glaucoma, cataracts, and diabetic retinopathy. These exams can also reveal signs of chronic conditions such as diabetes and high blood pressure.  Dental: Annual dental screenings help detect early signs of oral cancer, gum disease, and other conditions linked to overall health, including heart disease and diabetes.  Please see the attached documents for additional preventive care recommendations.

## 2024-04-28 ENCOUNTER — Encounter: Payer: Self-pay | Admitting: Family Medicine

## 2024-04-28 ENCOUNTER — Other Ambulatory Visit: Payer: Self-pay | Admitting: Medical Genetics

## 2024-04-29 ENCOUNTER — Encounter: Admitting: Family Medicine

## 2024-05-03 ENCOUNTER — Encounter: Payer: Self-pay | Admitting: Family Medicine

## 2024-05-03 NOTE — Telephone Encounter (Signed)
 FYI for tomorrows visit

## 2024-05-04 ENCOUNTER — Encounter: Payer: Self-pay | Admitting: Family Medicine

## 2024-05-04 ENCOUNTER — Ambulatory Visit (INDEPENDENT_AMBULATORY_CARE_PROVIDER_SITE_OTHER): Admitting: Family Medicine

## 2024-05-04 VITALS — BP 120/62 | HR 88 | Temp 97.9°F | Ht 67.5 in | Wt 141.4 lb

## 2024-05-04 DIAGNOSIS — Z0000 Encounter for general adult medical examination without abnormal findings: Secondary | ICD-10-CM | POA: Diagnosis not present

## 2024-05-04 DIAGNOSIS — R7303 Prediabetes: Secondary | ICD-10-CM

## 2024-05-04 DIAGNOSIS — E209 Hypoparathyroidism, unspecified: Secondary | ICD-10-CM | POA: Diagnosis not present

## 2024-05-04 DIAGNOSIS — E890 Postprocedural hypothyroidism: Secondary | ICD-10-CM | POA: Diagnosis not present

## 2024-05-04 LAB — CBC WITH DIFFERENTIAL/PLATELET
Basophils Absolute: 0 K/uL (ref 0.0–0.1)
Basophils Relative: 1 % (ref 0.0–3.0)
Eosinophils Absolute: 0.1 K/uL (ref 0.0–0.7)
Eosinophils Relative: 2.7 % (ref 0.0–5.0)
HCT: 37.7 % (ref 36.0–46.0)
Hemoglobin: 12.5 g/dL (ref 12.0–15.0)
Lymphocytes Relative: 52.6 % — ABNORMAL HIGH (ref 12.0–46.0)
Lymphs Abs: 2.4 K/uL (ref 0.7–4.0)
MCHC: 33.2 g/dL (ref 30.0–36.0)
MCV: 89.7 fl (ref 78.0–100.0)
Monocytes Absolute: 0.5 K/uL (ref 0.1–1.0)
Monocytes Relative: 11.4 % (ref 3.0–12.0)
Neutro Abs: 1.5 K/uL (ref 1.4–7.7)
Neutrophils Relative %: 32.3 % — ABNORMAL LOW (ref 43.0–77.0)
Platelets: 298 K/uL (ref 150.0–400.0)
RBC: 4.21 Mil/uL (ref 3.87–5.11)
RDW: 14.8 % (ref 11.5–15.5)
WBC: 4.7 K/uL (ref 4.0–10.5)

## 2024-05-04 LAB — TSH: TSH: 1.57 u[IU]/mL (ref 0.35–5.50)

## 2024-05-04 LAB — LIPID PANEL
Cholesterol: 227 mg/dL — ABNORMAL HIGH (ref 0–200)
HDL: 86.8 mg/dL (ref >39.00)
LDL Cholesterol: 120 mg/dL — ABNORMAL HIGH (ref 0–99)
NonHDL: 140.19
Total CHOL/HDL Ratio: 3
Triglycerides: 102 mg/dL (ref 0.0–149.0)
VLDL: 20.4 mg/dL (ref 0.0–40.0)

## 2024-05-04 LAB — HEPATIC FUNCTION PANEL
ALT: 14 U/L (ref 0–35)
AST: 25 U/L (ref 0–37)
Albumin: 4.1 g/dL (ref 3.5–5.2)
Alkaline Phosphatase: 44 U/L (ref 39–117)
Bilirubin, Direct: 0 mg/dL (ref 0.0–0.3)
Total Bilirubin: 0.5 mg/dL (ref 0.2–1.2)
Total Protein: 7.5 g/dL (ref 6.0–8.3)

## 2024-05-04 LAB — BASIC METABOLIC PANEL WITH GFR
BUN: 13 mg/dL (ref 6–23)
CO2: 32 meq/L (ref 19–32)
Calcium: 8.7 mg/dL (ref 8.4–10.5)
Chloride: 101 meq/L (ref 96–112)
Creatinine, Ser: 0.9 mg/dL (ref 0.40–1.20)
GFR: 65.36 mL/min (ref >60.00)
Glucose, Bld: 80 mg/dL (ref 70–99)
Potassium: 3.8 meq/L (ref 3.5–5.1)
Sodium: 139 meq/L (ref 135–145)

## 2024-05-04 LAB — VITAMIN D 25 HYDROXY (VIT D DEFICIENCY, FRACTURES): VITD: 46.44 ng/mL (ref 30.00–100.00)

## 2024-05-04 NOTE — Progress Notes (Signed)
   Subjective:    Patient ID: Tricia Woodward, female    DOB: [birth year], 68 y.o.   MRN: [id number]  HPI CPE- UTD on mammo, colonoscopy, Tdap, DEXA.  Declines PNA, flu, shingles  Patient Care Team    Relationship Specialty Notifications Start End  Mahlon Comer BRAVO, MD PCP - General Family Medicine  09/07/13   Nicholaus Sherlean CROME, [place] (Inactive)  Pharmacist  07/31/21    Comment: [phone]  Ortho, [facility]  Specialist  04/20/23   Tommas Pears, MD Referring Physician Endocrinology  04/20/23     Health Maintenance  Topic Date Due   Pneumococcal Vaccine: 50+ Years (1 of 2 - PCV) Never done   Zoster Vaccines- Shingrix (1 of 2) Never done   Influenza Vaccine  Never done   COVID-19 Vaccine (1 - 2025-26 season) Never done   Mammogram  05/24/2024   Medicare Annual Wellness (AWV)  04/13/2025   Colonoscopy  02/07/2026   DTaP/Tdap/Td (4 - Td or Tdap) 09/26/2030   Bone Density Scan  Completed   Hepatitis C Screening  Completed   Meningococcal B Vaccine  Aged Out      Review of Systems Patient reports no vision/ hearing changes, adenopathy,fever, weight change,  persistant/recurrent hoarseness , swallowing issues, chest pain, palpitations, edema, persistant/recurrent cough, hemoptysis, dyspnea (rest/exertional/paroxysmal nocturnal), gastrointestinal bleeding (melena, rectal bleeding), abdominal pain, significant heartburn, bowel changes, GU symptoms (dysuria, hematuria, incontinence), Gyn symptoms (abnormal  bleeding, pain),  syncope, focal weakness, memory loss, numbness & tingling, skin/hair/nail changes, abnormal bruising or bleeding, anxiety, or depression.     Objective:   Physical Exam General Appearance:    Alert, cooperative, no distress, appears stated age  Head:    Normocephalic, without obvious abnormality, atraumatic  Eyes:    PERRL, conjunctiva/corneas clear, EOM's intact both eyes  Ears:    Normal TM's and external ear canals, both ears  Nose:   Nares normal, septum midline,  mucosa normal, no drainage    or sinus tenderness  Throat:   Lips, mucosa, and tongue normal; teeth and gums normal  Neck:   Supple, symmetrical, trachea midline, no adenopathy;    Thyroid : no enlargement/tenderness/nodules  Back:     Symmetric, no curvature, ROM normal, no CVA tenderness  Lungs:     Clear to auscultation bilaterally, respirations unlabored  Chest Wall:    No tenderness or deformity   Heart:    Regular rate and rhythm, S1 and S2 normal, no murmur, rub   or gallop  Breast Exam:    Deferred to mammo  Abdomen:     Soft, non-tender, bowel sounds active all four quadrants,    no masses, no organomegaly  Genitalia:    Deferred  Rectal:    Extremities:   Extremities normal, atraumatic, no cyanosis or edema  Pulses:   2+ and symmetric all extremities  Skin:   Skin color, texture, turgor normal, no rashes or lesions  Lymph nodes:   Cervical, supraclavicular, and axillary nodes normal  Neurologic:   CNII-XII intact, normal strength, sensation and reflexes    throughout          Assessment & Plan:

## 2024-05-04 NOTE — Assessment & Plan Note (Signed)
 Pt's PE WNL.  UTD on mammo, colonoscopy, DEXA, Tdap.  Declines flu, PNA, shingles vaccines.  Check labs.  Anticipatory guidance provided.

## 2024-05-04 NOTE — Patient Instructions (Signed)
Follow up in 6 months to recheck cholesterol We'll notify you of your lab results and make any changes if needed Keep up the good work on healthy diet and regular exercise- you look great! Call with any questions or concerns Stay Safe!  Stay Healthy! Happy Holidays! 

## 2024-05-05 LAB — HEMOGLOBIN A1C: Hgb A1c MFr Bld: 6 % (ref 4.6–6.5)

## 2024-05-06 ENCOUNTER — Ambulatory Visit: Payer: Self-pay | Admitting: Family Medicine

## 2024-05-30 ENCOUNTER — Other Ambulatory Visit

## 2024-05-30 DIAGNOSIS — Z006 Encounter for examination for normal comparison and control in clinical research program: Secondary | ICD-10-CM

## 2024-05-31 ENCOUNTER — Encounter (INDEPENDENT_AMBULATORY_CARE_PROVIDER_SITE_OTHER): Payer: Self-pay

## 2024-06-01 ENCOUNTER — Inpatient Hospital Stay: Admission: RE | Admit: 2024-06-01 | Discharge: 2024-06-01 | Attending: Family Medicine | Admitting: Family Medicine

## 2024-06-01 DIAGNOSIS — Z1231 Encounter for screening mammogram for malignant neoplasm of breast: Secondary | ICD-10-CM

## 2024-06-07 LAB — GENECONNECT MOLECULAR SCREEN

## 2024-06-23 ENCOUNTER — Other Ambulatory Visit: Payer: Self-pay | Admitting: Family Medicine

## 2024-10-31 ENCOUNTER — Ambulatory Visit: Admitting: Family Medicine
